# Patient Record
Sex: Female | Born: 1960
Health system: Southern US, Community
[De-identification: ages and names within clinical notes are randomized; demographics above are authoritative.]

## PROBLEM LIST (undated history)

## (undated) DIAGNOSIS — C4491 Basal cell carcinoma of skin, unspecified: Secondary | ICD-10-CM

## (undated) DIAGNOSIS — R7303 Prediabetes: Secondary | ICD-10-CM

## (undated) DIAGNOSIS — M8580 Other specified disorders of bone density and structure, unspecified site: Secondary | ICD-10-CM

## (undated) DIAGNOSIS — H269 Unspecified cataract: Secondary | ICD-10-CM

## (undated) DIAGNOSIS — K219 Gastro-esophageal reflux disease without esophagitis: Secondary | ICD-10-CM

## (undated) DIAGNOSIS — E785 Hyperlipidemia, unspecified: Secondary | ICD-10-CM

## (undated) DIAGNOSIS — E559 Vitamin D deficiency, unspecified: Secondary | ICD-10-CM

## (undated) DIAGNOSIS — I10 Essential (primary) hypertension: Secondary | ICD-10-CM

## (undated) DIAGNOSIS — IMO0002 Reserved for concepts with insufficient information to code with codable children: Secondary | ICD-10-CM

## (undated) HISTORY — DX: Vitamin D deficiency, unspecified: E55.9

## (undated) HISTORY — PX: TUBAL LIGATION: SHX77

## (undated) HISTORY — DX: Essential (primary) hypertension: I10

## (undated) HISTORY — DX: Hyperlipidemia, unspecified: E78.5

## (undated) HISTORY — DX: Gastro-esophageal reflux disease without esophagitis: K21.9

## (undated) HISTORY — DX: Reserved for concepts with insufficient information to code with codable children: IMO0002

## (undated) HISTORY — DX: Basal cell carcinoma of skin, unspecified: C44.91

## (undated) HISTORY — DX: Prediabetes: R73.03

## (undated) HISTORY — DX: Unspecified cataract: H26.9

## (undated) HISTORY — DX: Other specified disorders of bone density and structure, unspecified site: M85.80

## (undated) HISTORY — PX: BASAL CELL CARCINOMA EXCISION: SHX1214

---

## 2000-12-06 HISTORY — PX: COLONOSCOPY: SHX174

## 2002-04-10 ENCOUNTER — Other Ambulatory Visit: Admission: RE | Admit: 2002-04-10 | Discharge: 2002-04-10 | Payer: Self-pay | Admitting: Family Medicine

## 2003-09-02 ENCOUNTER — Other Ambulatory Visit: Admission: RE | Admit: 2003-09-02 | Discharge: 2003-09-02 | Payer: Self-pay | Admitting: Family Medicine

## 2005-03-09 ENCOUNTER — Other Ambulatory Visit: Admission: RE | Admit: 2005-03-09 | Discharge: 2005-03-09 | Payer: Self-pay | Admitting: Family Medicine

## 2006-08-05 ENCOUNTER — Ambulatory Visit (HOSPITAL_COMMUNITY): Admission: RE | Admit: 2006-08-05 | Discharge: 2006-08-05 | Payer: Self-pay | Admitting: Neurosurgery

## 2006-08-06 HISTORY — PX: ANTERIOR CERVICAL DECOMP/DISCECTOMY FUSION: SHX1161

## 2006-08-25 ENCOUNTER — Inpatient Hospital Stay (HOSPITAL_COMMUNITY): Admission: RE | Admit: 2006-08-25 | Discharge: 2006-08-26 | Payer: Self-pay | Admitting: Neurosurgery

## 2011-10-05 ENCOUNTER — Inpatient Hospital Stay (INDEPENDENT_AMBULATORY_CARE_PROVIDER_SITE_OTHER)
Admission: RE | Admit: 2011-10-05 | Discharge: 2011-10-05 | Disposition: A | Discharge status: Home / Self Care | Payer: 59 | Source: Ambulatory Visit | Attending: Family Medicine | Admitting: Family Medicine

## 2011-10-05 DIAGNOSIS — R6889 Other general symptoms and signs: Secondary | ICD-10-CM

## 2011-10-05 LAB — POCT I-STAT, CHEM 8
BUN: 18 mg/dL (ref 6–23)
Calcium, Ion: 0.99 mmol/L — ABNORMAL LOW (ref 1.12–1.32)
Creatinine, Ser: 0.9 mg/dL (ref 0.50–1.10)
Glucose, Bld: 105 mg/dL — ABNORMAL HIGH (ref 70–99)
Hemoglobin: 13.3 g/dL (ref 12.0–15.0)
Sodium: 138 mEq/L (ref 135–145)
TCO2: 23 mmol/L (ref 0–100)

## 2013-04-09 ENCOUNTER — Telehealth: Payer: Self-pay | Admitting: Nurse Practitioner

## 2013-04-09 NOTE — Telephone Encounter (Signed)
Returned call. Nurse not available i advised she would call back

## 2013-04-09 NOTE — Telephone Encounter (Signed)
Pm appt made for 5/6

## 2013-04-10 ENCOUNTER — Ambulatory Visit: Payer: Self-pay

## 2013-10-15 ENCOUNTER — Encounter: Payer: Self-pay | Admitting: Nurse Practitioner

## 2013-11-07 ENCOUNTER — Encounter: Payer: Self-pay | Admitting: Certified Nurse Midwife

## 2013-11-07 ENCOUNTER — Other Ambulatory Visit: Payer: Self-pay

## 2013-11-07 ENCOUNTER — Ambulatory Visit (INDEPENDENT_AMBULATORY_CARE_PROVIDER_SITE_OTHER): Payer: 59 | Admitting: Certified Nurse Midwife

## 2013-11-07 VITALS — BP 108/64 | HR 68 | Resp 16 | Ht 63.0 in | Wt 171.0 lb

## 2013-11-07 DIAGNOSIS — R319 Hematuria, unspecified: Secondary | ICD-10-CM

## 2013-11-07 DIAGNOSIS — Z1211 Encounter for screening for malignant neoplasm of colon: Secondary | ICD-10-CM

## 2013-11-07 DIAGNOSIS — Z0000 Encounter for general adult medical examination without abnormal findings: Secondary | ICD-10-CM

## 2013-11-07 DIAGNOSIS — Z1231 Encounter for screening mammogram for malignant neoplasm of breast: Secondary | ICD-10-CM

## 2013-11-07 DIAGNOSIS — Z01419 Encounter for gynecological examination (general) (routine) without abnormal findings: Secondary | ICD-10-CM

## 2013-11-07 LAB — POCT URINALYSIS DIPSTICK
Bilirubin, UA: NEGATIVE
Ketones, UA: NEGATIVE
Protein, UA: NEGATIVE
pH, UA: 5

## 2013-11-07 NOTE — Progress Notes (Signed)
52 y.o. Tanya Wilkinson Married Caucasian Fe here for annual exam. Menopausal no HRT. Denies vaginal bleeding, and some vaginal dryness. Sees PCP yearly for labs and aex, last visit was 2012.  Denies vaginal or urinary symptoms today.  Has taken flu shot through [facility]. Complaining of right buttock pain while sitting. Has no difficulty walking or standing, only after prolonged sitting. No injuries to area. No other health issues.  Patient's last menstrual period was 12/07/2007.          Sexually active: yes  The current method of family planning is tubal ligation.    Exercising: no  exercise Smoker:  no  Health Maintenance: Pap:  2011 or 2012 no abnormal paps MMG:  2002 normal per patient Colonoscopy:  2002  Normal per patient BMD:   none TDaP:  2007 Labs: Poct urine-rbc-tr, wbc-tr Self breast exam: done occ   reports that she has quit smoking. She quit smokeless tobacco use about 18 years ago. She reports that she does not drink alcohol or use illicit drugs.  Past Medical History  Diagnosis Date  . Herniated disc     Past Surgical History  Procedure Laterality Date  . Tubal ligation  [id number]    BTL  . Cesarean section    . Anterior cervical decomp/discectomy fusion  08/2006    No current outpatient prescriptions on file.   No current facility-administered medications for this visit.    Family History  Problem Relation Age of Onset  . Diabetes Mother   . Hypertension Mother   . Cancer Father     throat & lung-smoker  . Stroke Father     ROS:  Pertinent items are noted in HPI.  Otherwise, a comprehensive ROS was negative.  Exam:   BP 108/64  Pulse 68  Resp 16  Ht 5\' 3"  (1.6 m)  Wt 171 lb (77.565 kg)  BMI 30.30 kg/m2  LMP 12/07/2007 Height: 5\' 3"  (160 cm)  Ht Readings from Last 3 Encounters:  11/07/13 5\' 3"  (1.6 m)    General appearance: alert, cooperative and appears stated age Head: Normocephalic, without obvious abnormality, atraumatic Neck: no  adenopathy, supple, symmetrical, trachea midline and thyroid normal to inspection and palpation and non-palpable Lungs: clear to auscultation bilaterally CVAT negative bilateral Breasts: normal appearance, no masses or tenderness, No nipple retraction or dimpling, No nipple discharge or bleeding, No axillary or supraclavicular adenopathy Heart: regular rate and rhythm Abdomen: soft, non-tender; no masses,  no organomegaly Extremities: extremities normal, atraumatic, no cyanosis or edema Skin: Skin color, texture, turgor normal. No rashes or lesions Lymph nodes: Cervical, supraclavicular, and axillary nodes normal. No abnormal inguinal nodes palpated Neurologic: Grossly normal   Pelvic: External genitalia:  no lesions              Urethra:  normal appearing urethra with no masses, tenderness or lesions, Bladder non tender or urethral meatus              Bartholin's and Skene's: normal                 Vagina: normal appearing vagina with normal color and discharge, no lesions, slight dryness no              Cervix: normal, non tender              Pap taken: yes Bimanual Exam:  Uterus:  normal size, contour, position, consistency, mobility, non-tender and anteverted  Adnexa: normal adnexa and no mass, fullness, tenderness               Rectovaginal: Confirms               Anus:  normal sphincter tone, no lesions  A:  Well Woman with normal exam  Menopausal no HRT  Vaginal Dryness  R/O UTI with urine WBC Tr, RBC tr  ? Sciatic nerve pain from prolonged sitting right buttock  P:   Reviewed health and wellness pertinent to exam  Discussed importance of advising if vaginal bleeding.  Discussed findings and options for treatment of estrogen or OTC products. Patient prefers OTC product. Will advise if problems.  Increase water intake daily, aware of UTI symptoms  Discussed ambulating more frequently and changing chair cushion if possible, encouraged stretching area. If not  resolving needs to see Orthopedic. Patient agreeable.  Fasting labs: Patient will come in for.  Lipid panel, CMP, Hgb A1c, TSH, Vitamin D  Pap smear as per guidelines   Mammogram yearly given information to schedule pap smear taken today with [facility]  counseled on breast self exam, mammography screening, adequate intake of calcium and vitamin D, diet and exercise, Kegel's exercises, and colonoscopy due. Patient request scheduling for.  return annually or prn  An After Visit Summary was printed and given to the patient.

## 2013-11-07 NOTE — Patient Instructions (Signed)

## 2013-11-08 LAB — URINALYSIS, MICROSCOPIC ONLY
Bacteria, UA: NONE SEEN
Casts: NONE SEEN
Crystals: NONE SEEN
Squamous Epithelial / LPF: NONE SEEN

## 2013-11-09 ENCOUNTER — Other Ambulatory Visit (INDEPENDENT_AMBULATORY_CARE_PROVIDER_SITE_OTHER): Payer: 59

## 2013-11-09 DIAGNOSIS — Z0000 Encounter for general adult medical examination without abnormal findings: Secondary | ICD-10-CM

## 2013-11-09 LAB — CBC
Hemoglobin: 12 g/dL (ref 12.0–15.0)
MCH: 29.9 pg (ref 26.0–34.0)
MCHC: 34.1 g/dL (ref 30.0–36.0)
RDW: 13.1 % (ref 11.5–15.5)

## 2013-11-09 LAB — COMPREHENSIVE METABOLIC PANEL
AST: 19 U/L (ref 0–37)
BUN: 12 mg/dL (ref 6–23)
Calcium: 9.3 mg/dL (ref 8.4–10.5)
Chloride: 101 mEq/L (ref 96–112)
Creat: 0.53 mg/dL (ref 0.50–1.10)
Total Bilirubin: 0.4 mg/dL (ref 0.3–1.2)

## 2013-11-09 LAB — LIPID PANEL
Cholesterol: 243 mg/dL — ABNORMAL HIGH (ref 0–200)
HDL: 37 mg/dL — ABNORMAL LOW (ref >39)
Total CHOL/HDL Ratio: 6.6 Ratio
VLDL: 50 mg/dL — ABNORMAL HIGH (ref 0–40)

## 2013-11-09 LAB — HEMOGLOBIN A1C: Hgb A1c MFr Bld: 5.8 % — ABNORMAL HIGH (ref <5.7)

## 2013-11-09 LAB — TSH: TSH: 2.336 u[IU]/mL (ref 0.350–4.500)

## 2013-11-09 NOTE — Progress Notes (Signed)
Note reviewed, agree with plan.  Quatavious Rossa, MD  

## 2013-11-12 ENCOUNTER — Telehealth: Payer: Self-pay | Admitting: Emergency Medicine

## 2013-11-12 MED ORDER — VITAMIN D (ERGOCALCIFEROL) 1.25 MG (50000 UNIT) PO CAPS: 50000.0000 [IU] | ORAL_CAPSULE | ORAL | Status: DC

## 2013-11-12 NOTE — Telephone Encounter (Signed)
Message left to return call to Tanya Wilkinson at [phone].    

## 2013-11-12 NOTE — Telephone Encounter (Signed)
Message copied by Joeseph Amor on Mon Nov 12, 2013 11:02 AM ------      Message from: Verner Chol      Created: Sun Nov 11, 2013  7:31 PM       Notify patient that CBC essentially normal      Hgb. A1c is elevated which put her at increased of developing diabetes (this is reflection of glucose levels over the past 4-6 weeks)      Liver, kidney, and glucose profile normal      Lipid panel is in the high range and needs PCP management she needs to call and schedule appointment and will need labs sent for review(n if no PCP we can refer)      Vitamin D per protocol due to very low       ------

## 2013-11-12 NOTE — Telephone Encounter (Signed)
Patient returned call. Message from Verner Chol CNM given. Verbalized understanding. Vitamin D ordered and instructions given. Patient has a pcp. Bennie Pierini, FNP and will follow up with her.   Mychart results released at patients request.

## 2013-11-13 ENCOUNTER — Telehealth: Payer: Self-pay | Admitting: Orthopedic Surgery

## 2013-11-13 NOTE — Telephone Encounter (Signed)
LM on pt's VM confirming name re: appt with Dr. Loreta Ave 12-13-13 at 1:30 for colonoscopy consult. Phone number given to reschedule [phone].

## 2013-12-07 ENCOUNTER — Ambulatory Visit
Admission: RE | Admit: 2013-12-07 | Discharge: 2013-12-07 | Disposition: A | Discharge status: Home / Self Care | Payer: 59 | Source: Ambulatory Visit

## 2013-12-07 DIAGNOSIS — Z1231 Encounter for screening mammogram for malignant neoplasm of breast: Secondary | ICD-10-CM

## 2013-12-26 ENCOUNTER — Encounter: Payer: Self-pay | Admitting: Nurse Practitioner

## 2013-12-26 ENCOUNTER — Ambulatory Visit (INDEPENDENT_AMBULATORY_CARE_PROVIDER_SITE_OTHER): Payer: 59 | Admitting: Nurse Practitioner

## 2013-12-26 VITALS — BP 133/78 | HR 72 | Temp 97.8°F | Ht 63.0 in | Wt 173.0 lb

## 2013-12-26 DIAGNOSIS — E785 Hyperlipidemia, unspecified: Secondary | ICD-10-CM

## 2013-12-26 DIAGNOSIS — E8881 Metabolic syndrome: Secondary | ICD-10-CM

## 2013-12-26 MED ORDER — ATORVASTATIN CALCIUM 40 MG PO TABS: 40.0000 mg | ORAL_TABLET | Freq: Every day | ORAL | Status: DC

## 2013-12-26 NOTE — Patient Instructions (Signed)
Basic Carbohydrate Counting Basic carbohydrate counting is a way to plan meals. It is done by counting the amount of carbohydrate in foods. Foods that have carbohydrates are starches (grains, beans, starchy vegetables) and sweets. Eating carbohydrates increases blood glucose (sugar) levels. People with diabetes use carbohydrate counting to help keep their blood glucose at a normal level.  COUNTING CARBOHYDRATES IN FOODS The first step in counting carbohydrates is to learn how many carbohydrate servings you should have in every meal. A dietitian can plan this for you. After learning the amount of carbohydrates to include in your meal plan, you can start to choose the carbohydrate-containing foods you want to eat.  There are 2 ways to identify the amount of carbohydrates in the foods you eat.  Read the Nutrition Facts panel on food labels. You need 2 pieces of information from the Nutrition Facts panel to count carbohydrates this way:  Serving size.  Total carbohydrate (in grams). Decide how many servings you will be eating. If it is 1 serving, you will be eating the amount of carbohydrate listed on the panel. If you will be eating 2 servings, you will be eating double the amount of carbohydrate listed on the panel.   Learn serving sizes. A serving size of most carbohydrate-containing foods is about 15 grams (g). Listed below are single serving sizes of common carbohydrate-containing foods:  1 slice bread.   cup unsweetened, dry cereal.   cup hot cereal.   cup rice.   cup mashed potatoes.   cup pasta.  1 cup fresh fruit.   cup canned fruit.  1 cup milk (whole, 2%, or skim).   cup starchy vegetables (peas, corn, or potatoes). Counting carbohydrates this way is similar to looking on the Nutrition Facts panel. Decide how many servings you will eat first. Multiply the number of servings you eat by 15 g. For example, if you have 2 cups of strawberries, you had 2 servings. That means  you had 30 g of carbohydrate (2 servings x 15 g = 30 g). CALCULATING CARBOHYDRATES IN A MEAL Sample dinner  3 oz chicken breast.   cup brown rice.   cup corn.  1 cup fat-free milk.  1 cup strawberries with sugar-free whipped topping. Carbohydrate calculation First, identify the foods that contain carbohydrate:  Rice.  Corn.  Milk.  Strawberries. Calculate the number of servings eaten:  2 servings rice.  1 serving corn.  1 serving milk.  1 serving strawberries. Multiply the number of servings by 15 g:  2 servings rice x 15 g = 30 g.  1 serving corn x 15 g = 15 g.  1 serving milk x 15 g = 15 g.  1 serving strawberries x 15 g = 15 g. Add the amounts to find the total carbohydrates eaten: 30 g + 15 g + 15 g + 15 g = 75 g carbohydrate eaten at dinner. Document Released: 11/22/2005 Document Revised: 02/14/2012 Document Reviewed: 10/08/2011 [facility] Patient Information 2014 [place], [place]. Fat and Cholesterol Control Diet Fat and cholesterol levels in your blood and organs are influenced by your diet. High levels of fat and cholesterol may lead to diseases of the heart, small and large blood vessels, gallbladder, liver, and pancreas. CONTROLLING FAT AND CHOLESTEROL WITH DIET Although exercise and lifestyle factors are important, your diet is key. That is because certain foods are known to raise cholesterol and others to lower it. The goal is to balance foods for their effect on cholesterol and more importantly,  to replace saturated and trans fat with other types of fat, such as monounsaturated fat, polyunsaturated fat, and omega-3 fatty acids. On average, a person should consume no more than 15 to 17 g of saturated fat daily. Saturated and trans fats are considered "bad" fats, and they will raise LDL cholesterol. Saturated fats are primarily found in animal products such as meats, butter, and cream. However, that does not mean you need to give up all your favorite foods.  Today, there are good tasting, low-fat, low-cholesterol substitutes for most of the things you like to eat. Choose low-fat or nonfat alternatives. Choose round or loin cuts of red meat. These types of cuts are lowest in fat and cholesterol. Chicken (without the skin), fish, veal, and ground [place] breast are great choices. Eliminate fatty meats, such as hot dogs and salami. Even shellfish have little or no saturated fat. Have a 3 oz (85 g) portion when you eat lean meat, poultry, or fish. Trans fats are also called "partially hydrogenated oils." They are oils that have been scientifically manipulated so that they are solid at room temperature resulting in a longer shelf life and improved taste and texture of foods in which they are added. Trans fats are found in stick margarine, some tub margarines, cookies, crackers, and baked goods.  When baking and cooking, oils are a great substitute for butter. The monounsaturated oils are especially beneficial since it is believed they lower LDL and raise HDL. The oils you should avoid entirely are saturated tropical oils, such as coconut and palm.  Remember to eat a lot from food groups that are naturally free of saturated and trans fat, including fish, fruit, vegetables, beans, grains (barley, rice, couscous, bulgur wheat), and pasta (without cream sauces).  IDENTIFYING FOODS THAT LOWER FAT AND CHOLESTEROL  Soluble fiber may lower your cholesterol. This type of fiber is found in fruits such as apples, vegetables such as broccoli, potatoes, and carrots, legumes such as beans, peas, and lentils, and grains such as barley. Foods fortified with plant sterols (phytosterol) may also lower cholesterol. You should eat at least 2 g per day of these foods for a cholesterol lowering effect.  Read package labels to identify low-saturated fats, trans fat free, and low-fat foods at the supermarket. Select cheeses that have only 2 to 3 g saturated fat per ounce. Use a heart-healthy  tub margarine that is free of trans fats or partially hydrogenated oil. When buying baked goods (cookies, crackers), avoid partially hydrogenated oils. Breads and muffins should be made from whole grains (whole-wheat or whole oat flour, instead of "flour" or "enriched flour"). Buy non-creamy canned soups with reduced salt and no added fats.  FOOD PREPARATION TECHNIQUES  Never deep-fry. If you must fry, either stir-fry, which uses very little fat, or use non-stick cooking sprays. When possible, broil, bake, or roast meats, and steam vegetables. Instead of putting butter or margarine on vegetables, use lemon and herbs, applesauce, and cinnamon (for squash and sweet potatoes). Use nonfat yogurt, salsa, and low-fat dressings for salads.  LOW-SATURATED FAT / LOW-FAT FOOD SUBSTITUTES Meats / Saturated Fat (g)  Avoid: Steak, marbled (3 oz/85 g) / 11 g  Choose: Steak, lean (3 oz/85 g) / 4 g  Avoid: Hamburger (3 oz/85 g) / 7 g  Choose: Hamburger, lean (3 oz/85 g) / 5 g  Avoid: Ham (3 oz/85 g) / 6 g  Choose: Ham, lean cut (3 oz/85 g) / 2.4 g  Avoid: Chicken, with skin, dark meat (3  oz/85 g) / 4 g  Choose: Chicken, skin removed, dark meat (3 oz/85 g) / 2 g  Avoid: Chicken, with skin, light meat (3 oz/85 g) / 2.5 g  Choose: Chicken, skin removed, light meat (3 oz/85 g) / 1 g Dairy / Saturated Fat (g)  Avoid: Whole milk (1 cup) / 5 g  Choose: Low-fat milk, 2% (1 cup) / 3 g  Choose: Low-fat milk, 1% (1 cup) / 1.5 g  Choose: Skim milk (1 cup) / 0.3 g  Avoid: Hard cheese (1 oz/28 g) / 6 g  Choose: Skim milk cheese (1 oz/28 g) / 2 to 3 g  Avoid: Cottage cheese, 4% fat (1 cup) / 6.5 g  Choose: Low-fat cottage cheese, 1% fat (1 cup) / 1.5 g  Avoid: Ice cream (1 cup) / 9 g  Choose: Sherbet (1 cup) / 2.5 g  Choose: Nonfat frozen yogurt (1 cup) / 0.3 g  Choose: Frozen fruit bar / trace  Avoid: Whipped cream (1 tbs) / 3.5 g  Choose: Nondairy whipped topping (1 tbs) / 1 g Condiments /  Saturated Fat (g)  Avoid: Mayonnaise (1 tbs) / 2 g  Choose: Low-fat mayonnaise (1 tbs) / 1 g  Avoid: Butter (1 tbs) / 7 g  Choose: Extra light margarine (1 tbs) / 1 g  Avoid: Coconut oil (1 tbs) / 11.8 g  Choose: Olive oil (1 tbs) / 1.8 g  Choose: Corn oil (1 tbs) / 1.7 g  Choose: Safflower oil (1 tbs) / 1.2 g  Choose: Sunflower oil (1 tbs) / 1.4 g  Choose: Soybean oil (1 tbs) / 2.4 g  Choose: Canola oil (1 tbs) / 1 g Document Released: 11/22/2005 Document Revised: 03/19/2013 Document Reviewed: 05/13/2011 [employer] Patient Information 2014 [place], [place].

## 2013-12-26 NOTE — Progress Notes (Signed)
   Subjective:    Patient ID: Tanya Wilkinson, female    DOB: [birth year], 53 y.o.   MRN: [id number]  HPI Patient had CPE and PAP in December at [facility]- They did blood work and found er cholesterol to be elevated and they wanted her to follow up with her PCP to get meds.    Review of Systems  Constitutional: Negative.   HENT: Negative.   Respiratory: Negative.   Cardiovascular: Negative.   Gastrointestinal: Negative.   Genitourinary: Negative.   All other systems reviewed and are negative.       Objective:   Physical Exam  Constitutional: She is oriented to person, place, and time. She appears well-developed and well-nourished.  Cardiovascular: Normal rate, regular rhythm and normal heart sounds.   Pulmonary/Chest: Effort normal and breath sounds normal.  Neurological: She is alert and oriented to person, place, and time.  Psychiatric: She has a normal mood and affect. Her behavior is normal. Judgment and thought content normal.    BP 133/78  Pulse 72  Temp(Src) 97.8 F (36.6 C) (Oral)  Ht 5\' 3"  (1.6 m)  Wt 173 lb (78.472 kg)  BMI 30.65 kg/m2       Assessment & Plan:   1. Hyperlipidemia LDL goal < 100   2. Metabolic syndrome    No orders of the defined types were placed in this encounter.   Meds ordered this encounter  Medications  . atorvastatin (LIPITOR) 40 MG tablet    Sig: Take 1 tablet (40 mg total) by mouth daily.    Dispense:  90 tablet    Refill:  1    Order Specific Question:  Supervising Provider    Answer:  Chipper Herb [[id number]]   Discussed carbs no more than 50g per meal and 15g x2 snacks Labs pending Health maintenance reviewed Diet and exercise encouraged Continue all meds Follow up  In 3 months   [place], FNP

## 2014-04-01 ENCOUNTER — Ambulatory Visit: Payer: 59 | Admitting: Nurse Practitioner

## 2014-04-04 ENCOUNTER — Encounter: Payer: Self-pay | Admitting: *Deleted

## 2014-05-02 ENCOUNTER — Ambulatory Visit: Payer: 59 | Admitting: Nurse Practitioner

## 2014-05-03 ENCOUNTER — Ambulatory Visit: Payer: 59 | Admitting: Nurse Practitioner

## 2014-06-05 ENCOUNTER — Ambulatory Visit (INDEPENDENT_AMBULATORY_CARE_PROVIDER_SITE_OTHER): Payer: 59 | Admitting: Nurse Practitioner

## 2014-06-05 ENCOUNTER — Encounter: Payer: Self-pay | Admitting: Nurse Practitioner

## 2014-06-05 VITALS — BP 118/78 | HR 65 | Temp 98.8°F | Ht 63.0 in | Wt 172.4 lb

## 2014-06-05 DIAGNOSIS — Z13820 Encounter for screening for osteoporosis: Secondary | ICD-10-CM

## 2014-06-05 DIAGNOSIS — E785 Hyperlipidemia, unspecified: Secondary | ICD-10-CM

## 2014-06-05 MED ORDER — ATORVASTATIN CALCIUM 40 MG PO TABS: 40.0000 mg | ORAL_TABLET | Freq: Every day | ORAL | Status: DC

## 2014-06-05 NOTE — Patient Instructions (Signed)

## 2014-06-05 NOTE — Progress Notes (Signed)
   Subjective:    Patient ID: Tanya Wilkinson, female    DOB: January 13, 1961, 53 y.o.   MRN: [id number] Patient is here today for chronic disease follow up. No complaints.   Hyperlipidemia This is a chronic problem. The current episode started more than 1 year ago. The problem is controlled. She has no history of chronic renal disease, diabetes, hypothyroidism or liver disease. Pertinent negatives include no chest pain, focal sensory loss, focal weakness, leg pain, myalgias or shortness of breath. Current antihyperlipidemic treatment includes statins. The current treatment provides moderate improvement of lipids. There are no compliance problems.       Review of Systems  Constitutional: Negative.   HENT: Negative.   Eyes: Negative.   Respiratory: Negative.  Negative for shortness of breath.   Cardiovascular: Negative.  Negative for chest pain.  Gastrointestinal: Negative.   Endocrine: Negative.   Genitourinary: Negative.   Musculoskeletal: Negative.  Negative for myalgias.  Skin: Negative.   Allergic/Immunologic: Negative.   Neurological: Negative.  Negative for focal weakness.  Hematological: Negative.   Psychiatric/Behavioral: Negative.        Objective:   Physical Exam  Constitutional: She is oriented to person, place, and time. She appears well-developed and well-nourished.  HENT:  Head: Normocephalic.  Eyes: Pupils are equal, round, and reactive to light.  Neck: Normal range of motion.  Cardiovascular: Normal rate.   Abdominal: Soft.  Musculoskeletal: Normal range of motion.  Neurological: She is alert and oriented to person, place, and time.     BP 118/78  Pulse 65  Temp(Src) 98.8 F (37.1 C) (Oral)  Ht _0  (1.6 m)  Wt 172 lb 6.4 oz (78.2 kg)  BMI 30.55 kg/m2      Assessment & Plan:   1. Hyperlipidemia with target LDL less than 100    Orders Placed This Encounter  Procedures  . DG Bone Density    Standing Status: Future     Number of Occurrences:    Standing Expiration Date: 08/05/2015    Order Specific Question:  Reason for Exam (SYMPTOM  OR DIAGNOSIS REQUIRED)    Answer:  screening    Order Specific Question:  Is the patient pregnant?    Answer:  No    Order Specific Question:  Preferred imaging location?    Answer:  Internal  . CMP14+EGFR  . NMR, lipoprofile   Meds ordered this encounter  Medications  . atorvastatin (LIPITOR) 40 MG tablet    Sig: Take 1 tablet (40 mg total) by mouth daily.    Dispense:  90 tablet    Refill:  1    Order Specific Question:  Supervising Provider    Answer:  Chipper Herb [[id number]]    Keep appointment for colonoscopy that is already scheduled Labs pending Health maintenance reviewed Diet and exercise encouraged Continue all meds Follow up  In 3  Months  [place], [place]ell Done, FNP

## 2014-06-06 LAB — CMP14+EGFR
ALBUMIN: 4.5 g/dL (ref 3.5–5.5)
ALT: 14 IU/L (ref 0–32)
AST: 18 IU/L (ref 0–40)
Albumin/Globulin Ratio: 2 (ref 1.1–2.5)
Alkaline Phosphatase: 112 IU/L (ref 39–117)
BILIRUBIN TOTAL: 0.5 mg/dL (ref 0.0–1.2)
BUN/Creatinine Ratio: 16 (ref 9–23)
BUN: 11 mg/dL (ref 6–24)
CALCIUM: 9.7 mg/dL (ref 8.7–10.2)
CHLORIDE: 101 mmol/L (ref 97–108)
CO2: 26 mmol/L (ref 18–29)
Creatinine, Ser: 0.69 mg/dL (ref 0.57–1.00)
GFR calc non Af Amer: 99 mL/min/{1.73_m2} (ref >59)
GFR, EST AFRICAN AMERICAN: 114 mL/min/{1.73_m2} (ref >59)
GLUCOSE: 93 mg/dL (ref 65–99)
Globulin, Total: 2.2 g/dL (ref 1.5–4.5)
POTASSIUM: 4.9 mmol/L (ref 3.5–5.2)
Sodium: 142 mmol/L (ref 134–144)
TOTAL PROTEIN: 6.7 g/dL (ref 6.0–8.5)

## 2014-06-06 LAB — NMR, LIPOPROFILE
CHOLESTEROL: 176 mg/dL (ref 100–199)
HDL CHOLESTEROL BY NMR: 22 mg/dL — AB (ref >39)
HDL Particle Number: 24.8 umol/L — ABNORMAL LOW (ref >=30.5)
LDL PARTICLE NUMBER: 899 nmol/L (ref <1000)
LDL Size: 19.6 nm (ref >20.5)
LP-IR Score: 69 — ABNORMAL HIGH (ref <=45)
Small LDL Particle Number: 563 nmol/L — ABNORMAL HIGH (ref <=527)
TRIGLYCERIDES BY NMR: 414 mg/dL — AB (ref 0–149)

## 2014-06-24 ENCOUNTER — Encounter: Payer: Self-pay | Admitting: Internal Medicine

## 2014-07-22 ENCOUNTER — Encounter: Payer: Self-pay | Admitting: Internal Medicine

## 2014-07-24 ENCOUNTER — Encounter: Payer: 59 | Admitting: Internal Medicine

## 2014-09-04 ENCOUNTER — Encounter: Payer: Self-pay | Admitting: Pharmacist

## 2014-09-04 ENCOUNTER — Ambulatory Visit (INDEPENDENT_AMBULATORY_CARE_PROVIDER_SITE_OTHER): Payer: 59 | Admitting: Pharmacist

## 2014-09-04 ENCOUNTER — Ambulatory Visit (AMBULATORY_SURGERY_CENTER): Payer: Self-pay | Admitting: *Deleted

## 2014-09-04 ENCOUNTER — Ambulatory Visit (INDEPENDENT_AMBULATORY_CARE_PROVIDER_SITE_OTHER): Payer: 59

## 2014-09-04 VITALS — Ht 64.0 in | Wt 176.0 lb

## 2014-09-04 VITALS — Ht 64.0 in | Wt 174.0 lb

## 2014-09-04 DIAGNOSIS — M949 Disorder of cartilage, unspecified: Secondary | ICD-10-CM

## 2014-09-04 DIAGNOSIS — Z1211 Encounter for screening for malignant neoplasm of colon: Secondary | ICD-10-CM

## 2014-09-04 DIAGNOSIS — Z13820 Encounter for screening for osteoporosis: Secondary | ICD-10-CM

## 2014-09-04 DIAGNOSIS — M8580 Other specified disorders of bone density and structure, unspecified site: Secondary | ICD-10-CM | POA: Insufficient documentation

## 2014-09-04 DIAGNOSIS — M899 Disorder of bone, unspecified: Secondary | ICD-10-CM

## 2014-09-04 DIAGNOSIS — Z23 Encounter for immunization: Secondary | ICD-10-CM

## 2014-09-04 DIAGNOSIS — R7989 Other specified abnormal findings of blood chemistry: Secondary | ICD-10-CM

## 2014-09-04 LAB — HM DEXA SCAN

## 2014-09-04 MED ORDER — CALCIUM + D3 600-200 MG-UNIT PO TABS: 1.0000 | ORAL_TABLET | Freq: Every day | ORAL | Status: DC

## 2014-09-04 MED ORDER — MOVIPREP 100 G PO SOLR: ORAL | Status: DC

## 2014-09-04 NOTE — Progress Notes (Signed)
Patient ID: SAMI ROES, female   DOB: 03/28/1961, 53 y.o.   MRN: [id number]  Osteoporosis [facility] Current Height: Height: 5\' 4"  (162.6 cm)      Max Lifetime Height:  5\' 4"  Current Weight: Weight: 174 lb (78.926 kg)       Ethnicity:Caucasian    HPI: Does pt already have a diagnosis of:  Osteopenia?  No Osteoporosis?  No  Back Pain?  Yes       Kyphosis?  No Prior fracture?  No Med(s) for Osteoporosis/Osteopenia:  none Med(s) previously tried for Osteoporosis/Osteopenia:  none                                                             PMH: Age at menopause:  53yo Hysterectomy?  No Oophorectomy?  No HRT? No Steroid Use?  No Thyroid med?  No History of cancer?  No History of digestive disorders (ie Crohn's)?  No Current or previous eating disorders?  No Last Vitamin D Result:  14 (11/09/2013) Last GFR Result:  99 (06/05/2014)   FH/SH: Family history of osteoporosis?  No Parent with history of hip fracture?  No Family history of breast cancer?  No Exercise?  No Smoking?  No Alcohol?  No    Calcium Assessment Calcium Intake  # of servings/day  Calcium mg  Milk (8 oz) 0.5  x  300  = 150mg   Yogurt (4 oz) 0.5 x  200 = 100mg   Cheese (1 oz) 0.5 x  200 = 100mg   Other Calcium sources   250mg   Ca supplement 0 = 0   Estimated calcium intake per day 600mg     DEXA Results Date of Test T-Score for AP Spine L1-L4 T-Score for Total Left Hip T-Score for Total Right Hip  09/04/2014 -1.4 -1.9 -1.7                  FRAX 10 year estimate: Total FX risk:  7.3%  (consider medication if >/= 20%) Hip FX risk:  0.8%  (consider medication if >/= 3%)  Assessment: Osteopenia with low fracture risk per FRAX estimate  Recommendations: 1.  Discussed DEXA results and fracture risk. 2.  recommend calcium 1200mg  daily through supplementation or diet.  3.  recommend weight bearing exercise - 30 minutes at least 4 days per week.   4.  Counseled and educated about fall risk and  prevention.  Recheck DEXA:  2 years  Time spent counseling patient:  30 minutes  Cherre Robins, PharmD, CPP

## 2014-09-04 NOTE — Progress Notes (Signed)
Patient denies any allergies to eggs or soy. Patient denies any problems with anesthesia/sedation. Patient denies any oxygen use at home and does not take any diet/weight loss medications. EMMI education assisgned to patient on colonoscopy, this was explained and instructions given to patient. 

## 2014-09-04 NOTE — Patient Instructions (Signed)
Fall Prevention and Home Safety Falls cause injuries and can affect all age groups. It is possible to use preventive measures to significantly decrease the likelihood of falls. There are many simple measures which can make your home safer and prevent falls. OUTDOORS  Repair cracks and edges of walkways and driveways.  Remove high doorway thresholds.  Trim shrubbery on the main path into your home.  Have good outside lighting.  Clear walkways of tools, rocks, debris, and clutter.  Check that handrails are not broken and are securely fastened. Both sides of steps should have handrails.  Have leaves, snow, and ice cleared regularly.  Use sand or salt on walkways during winter months.  In the garage, clean up grease or oil spills. BATHROOM  Install night lights.  Install grab bars by the toilet and in the tub and shower.  Use non-skid mats or decals in the tub or shower.  Place a plastic non-slip stool in the shower to sit on, if needed.  Keep floors dry and clean up all water on the floor immediately.  Remove soap buildup in the tub or shower on a regular basis.  Secure bath mats with non-slip, double-sided rug tape.  Remove throw rugs and tripping hazards from the floors. BEDROOMS  Install night lights.  Make sure a bedside light is easy to reach.  Do not use oversized bedding.  Keep a telephone by your bedside.  Have a firm chair with side arms to use for getting dressed.  Remove throw rugs and tripping hazards from the floor. KITCHEN  Keep handles on pots and pans turned toward the center of the stove. Use back burners when possible.  Clean up spills quickly and allow time for drying.  Avoid walking on wet floors.  Avoid hot utensils and knives.  Position shelves so they are not too high or low.  Place commonly used objects within easy reach.  If necessary, use a sturdy step stool with a grab bar when reaching.  Keep electrical cables out of the  way.  Do not use floor polish or wax that makes floors slippery. If you must use wax, use non-skid floor wax.  Remove throw rugs and tripping hazards from the floor. STAIRWAYS  Never leave objects on stairs.  Place handrails on both sides of stairways and use them. Fix any loose handrails. Make sure handrails on both sides of the stairways are as long as the stairs.  Check carpeting to make sure it is firmly attached along stairs. Make repairs to worn or loose carpet promptly.  Avoid placing throw rugs at the top or bottom of stairways, or properly secure the rug with carpet tape to prevent slippage. Get rid of throw rugs, if possible.  Have an electrician put in a light switch at the top and bottom of the stairs. OTHER FALL PREVENTION TIPS  Wear low-heel or rubber-soled shoes that are supportive and fit well. Wear closed toe shoes.  When using a stepladder, make sure it is fully opened and both spreaders are firmly locked. Do not climb a closed stepladder.  Add color or contrast paint or tape to grab bars and handrails in your home. Place contrasting color strips on first and last steps.  Learn and use mobility aids as needed. Install an electrical emergency response system.  Turn on lights to avoid dark areas. Replace light bulbs that burn out immediately. Get light switches that glow.  Arrange furniture to create clear pathways. Keep furniture in the same place.    Firmly attach carpet with non-skid or double-sided tape.  Eliminate uneven floor surfaces.  Select a carpet pattern that does not visually hide the edge of steps.  Be aware of all pets. OTHER HOME SAFETY TIPS  Set the water temperature for 120 F (48.8 C).  Keep emergency numbers on or near the telephone.  Keep smoke detectors on every level of the home and near sleeping areas. Document Released: 11/12/2002 Document Revised: 05/23/2012 Document Reviewed: 02/11/2012 Exit[employer] Patient Information 2015  [employer]. This information is not intended to replace advice given to you by your health care provider. Make sure you discuss any questions you have with your health care provider.                Exercise for Strong Bones  Exercise is important to build and maintain strong bones / bone density.  There are 2 types of exercises that are important to building and maintaining strong bones:  Weight- bearing and muscle-stregthening.  Weight-bearing Exercises  These exercises include activities that make you move against gravity while staying upright. Weight-bearing exercises can be high-impact or low-impact.  High-impact weight-bearing exercises help build bones and keep them strong. If you have broken a bone due to osteoporosis or are at risk of breaking a bone, you may need to avoid high-impact exercises. If you're not sure, you should check with your healthcare provider.  Examples of high-impact weight-bearing exercises are: Dancing  Doing high-impact aerobics  Hiking  Jogging/running  Jumping Rope  Stair climbing  Tennis  Low-impact weight-bearing exercises can also help keep bones strong and are a safe alternative if you cannot do high-impact exercises.   Examples of low-impact weight-bearing exercises are: Using elliptical training machines  Doing low-impact aerobics  Using stair-step machines  Fast walking on a treadmill or outside   Muscle-Strengthening Exercises These exercises include activities where you move your body, a weight or some other resistance against gravity. They are also known as resistance exercises and include: Lifting weights  Using elastic exercise bands  Using weight machines  Lifting your own body weight  Functional movements, such as standing and rising up on your toes  Yoga and Pilates can also improve strength, balance and flexibility. However, certain positions may not be safe for people with osteoporosis or those at increased risk of broken  bones. For example, exercises that have you bend forward may increase the chance of breaking a bone in the spine.   Non-Impact Exercises There are other types of exercises that can help prevent falls.  Non-impact exercises can help you to improve balance, posture and how well you move in everyday activities. Some of these exercises include: Balance exercises that strengthen your legs and test your balance, such as Tai Chi, can decrease your risk of falls.  Posture exercises that improve your posture and reduce rounded or "sloping" shoulders can help you decrease the chance of breaking a bone, especially in the spine.  Functional exercises that improve how well you move can help you with everyday activities and decrease your chance of falling and breaking a bone. For example, if you have trouble getting up from a chair or climbing stairs, you should do these activities as exercises.   **A physical therapist can teach you balance, posture and functional exercises. He/she can also help you learn which exercises are safe and appropriate for you.  [facility] has a physical therapy office in [place] in front of our office and referrals can be made for assessments   and treatment as needed and strength and balance training.  If you would like to have an assessment with [facility] and our physical therapy team please let a nurse or provider know.    

## 2014-09-05 ENCOUNTER — Telehealth: Payer: Self-pay | Admitting: Pharmacist

## 2014-09-05 DIAGNOSIS — R7989 Other specified abnormal findings of blood chemistry: Secondary | ICD-10-CM

## 2014-09-05 LAB — VITAMIN D 25 HYDROXY (VIT D DEFICIENCY, FRACTURES): VIT D 25 HYDROXY: 15.2 ng/mL — AB (ref 30.0–100.0)

## 2014-09-05 MED ORDER — VITAMIN D (ERGOCALCIFEROL) 1.25 MG (50000 UNIT) PO CAPS: 50000.0000 [IU] | ORAL_CAPSULE | ORAL | Status: DC

## 2014-09-05 NOTE — Telephone Encounter (Signed)
Patient notified about low vitamin D level.  Rx send to pharmacy for Vitamin D 50,000IU weekly.  Patient also given appt to recheck viatmin D level in January 2016.

## 2014-09-18 ENCOUNTER — Encounter: Payer: Self-pay | Admitting: Internal Medicine

## 2014-09-18 ENCOUNTER — Ambulatory Visit (AMBULATORY_SURGERY_CENTER): Payer: 59 | Admitting: Internal Medicine

## 2014-09-18 VITALS — BP 102/64 | HR 69 | Temp 97.8°F | Resp 16 | Ht 64.0 in | Wt 176.0 lb

## 2014-09-18 DIAGNOSIS — Z1211 Encounter for screening for malignant neoplasm of colon: Secondary | ICD-10-CM

## 2014-09-18 DIAGNOSIS — D127 Benign neoplasm of rectosigmoid junction: Secondary | ICD-10-CM

## 2014-09-18 MED ORDER — SODIUM CHLORIDE 0.9 % IV SOLN: 500.0000 mL | INTRAVENOUS | Status: DC

## 2014-09-18 NOTE — Patient Instructions (Signed)
YOU HAD AN ENDOSCOPIC PROCEDURE TODAY AT THE [facility]: Refer to the procedure report that was given to you for any specific questions about what was found during the examination.  If the procedure report does not answer your questions, please call your gastroenterologist to clarify.  If you requested that your care partner not be given the details of your procedure findings, then the procedure report has been included in a sealed envelope for you to review at your convenience later.  YOU SHOULD EXPECT: Some feelings of bloating in the abdomen. Passage of more gas than usual.  Walking can help get rid of the air that was put into your GI tract during the procedure and reduce the bloating. If you had a lower endoscopy (such as a colonoscopy or flexible sigmoidoscopy) you may notice spotting of blood in your stool or on the toilet paper. If you underwent a bowel prep for your procedure, then you may not have a normal bowel movement for a few days.  DIET: Your first meal following the procedure should be a light meal and then it is ok to progress to your normal diet.  A half-sandwich or bowl of soup is an example of a good first meal.  Heavy or fried foods are harder to digest and may make you feel nauseous or bloated.  Likewise meals heavy in dairy and vegetables can cause extra gas to form and this can also increase the bloating.  Drink plenty of fluids but you should avoid alcoholic beverages for 24 hours.  ACTIVITY: Your care partner should take you home directly after the procedure.  You should plan to take it easy, moving slowly for the rest of the day.  You can resume normal activity the day after the procedure however you should NOT DRIVE or use heavy machinery for 24 hours (because of the sedation medicines used during the test).    SYMPTOMS TO REPORT IMMEDIATELY: A gastroenterologist can be reached at any hour.  During normal business hours, 8:30 AM to 5:00 PM Monday through Friday,  call [phone].  After hours and on weekends, please call the GI answering service at [phone] who will take a message and have the physician on call contact you.   Following lower endoscopy (colonoscopy or flexible sigmoidoscopy):  Excessive amounts of blood in the stool  Significant tenderness or worsening of abdominal pains  Swelling of the abdomen that is new, acute  Fever of 100F or higher   FOLLOW UP: If any biopsies were taken you will be contacted by phone or by letter within the next 1-3 weeks.  Call your gastroenterologist if you have not heard about the biopsies in 3 weeks.  Our staff will call the home number listed on your records the next business day following your procedure to check on you and address any questions or concerns that you may have at that time regarding the information given to you following your procedure. This is a courtesy call and so if there is no answer at the home number and we have not heard from you through the emergency physician on call, we will assume that you have returned to your regular daily activities without incident.  SIGNATURES/CONFIDENTIALITY: You and/or your care partner have signed paperwork which will be entered into your electronic medical record.  These signatures attest to the fact that that the information above on your After Visit Summary has been reviewed and is understood.  Full responsibility of the confidentiality of   this discharge information lies with you and/or your care-partner.  Polyp information given.  Dr. Hilarie Fredrickson will advise you about next colonoscopy after pathology reports are reviewed.

## 2014-09-18 NOTE — Progress Notes (Signed)
A/ox3, pleased with MAC, report to RN 

## 2014-09-18 NOTE — Progress Notes (Signed)
Called to room to assist during endoscopic procedure.  Patient ID and intended procedure confirmed with present staff. Received instructions for my participation in the procedure from the performing physician.  

## 2014-09-18 NOTE — Op Note (Signed)
[facility]  [address] [place], [id number]   COLONOSCOPY PROCEDURE REPORT  PATIENT: Tanya Wilkinson, Tanya Wilkinson  MR#: [id number] BIRTHDATE: [birth year] , 61  yrs. old GENDER: female ENDOSCOPIST: Jerene Bears, MD REFERRED NF:AOZH Rockne Coons, N.P. PROCEDURE DATE:  09/18/2014 PROCEDURE:   Colonoscopy with snare polypectomy First Screening Colonoscopy - Avg.  risk and is 50 yrs.  old or older Yes.  Prior Negative Screening - Now for repeat screening. N/A  History of Adenoma - Now for follow-up colonoscopy & has been > or = to 3 yrs.  N/A  Polyps Removed Today? Yes. ASA CLASS:   Class II INDICATIONS:average risk for colon cancer. MEDICATIONS: Monitored anesthesia care and Propofol 300 mg IV  DESCRIPTION OF PROCEDURE:   After the risks benefits and alternatives of the procedure were thoroughly explained, informed consent was obtained.  The digital rectal exam revealed no rectal mass.   The LB [id number]  endoscope was introduced through the anus and advanced to the terminal ileum which was intubated for a short distance. No adverse events experienced.   The quality of the prep was good, using MoviPrep  The instrument was then slowly withdrawn as the colon was fully examined.   COLON FINDINGS: The examined terminal ileum appeared to be normal. A sessile polyp measuring 5 mm in size was found in the sigmoid colon.  A polypectomy was performed with a cold snare.  The resection was complete, the polyp tissue was completely retrieved and sent to histology.  Retroflexed views revealed external hemorrhoids. The time to cecum=2 minutes 38 seconds.  Withdrawal time=9 minutes 07 seconds.  The scope was withdrawn and the procedure completed. COMPLICATIONS: There were no immediate complications.  ENDOSCOPIC IMPRESSION: 1.   The examined terminal ileum appeared to be normal 2.   Sessile polyp was found in the sigmoid colon; polypectomy was performed with a cold  snare  RECOMMENDATIONS: 1.  Await pathology results 2.  If the polyp removed today is proven to be an adenomatous (pre-cancerous) polyp, you will need a repeat colonoscopy in 5 years.  Otherwise you should continue to follow colorectal cancer screening guidelines for "routine risk" patients with colonoscopy in 10 years.  You will receive a letter within 1-2 weeks with the results of your biopsy as well as final recommendations.  Please call my office if you have not received a letter after 3 weeks.  eSigned:  Jerene Bears, MD 09/18/2014 9:00 AM   cc: The Patient and Chevis Pretty, NP

## 2014-09-19 ENCOUNTER — Telehealth: Payer: Self-pay | Admitting: *Deleted

## 2014-09-19 NOTE — Telephone Encounter (Signed)
  Follow up Call-  Call back number 09/18/2014  Post procedure Call Back phone  # [phone]  Permission to leave phone message Yes     Patient questions:  Do you have a fever, pain , or abdominal swelling? No. Pain Score  0 *  Have you tolerated food without any problems? Yes.    Have you been able to return to your normal activities? Yes.    Do you have any questions about your discharge instructions: Diet   No. Medications  No. Follow up visit  No.  Do you have questions or concerns about your Care? No.  Actions: * If pain score is 4 or above: No action needed, pain <4.

## 2014-09-23 ENCOUNTER — Encounter: Payer: Self-pay | Admitting: Internal Medicine

## 2014-10-04 ENCOUNTER — Ambulatory Visit: Payer: 59 | Admitting: Nurse Practitioner

## 2014-10-05 ENCOUNTER — Emergency Department (HOSPITAL_COMMUNITY)
Admission: EM | Admit: 2014-10-05 | Discharge: 2014-10-05 | Disposition: A | Discharge status: Home / Self Care | Payer: 59 | Source: Home / Self Care | Attending: Family Medicine | Admitting: Family Medicine

## 2014-10-05 ENCOUNTER — Encounter (HOSPITAL_COMMUNITY): Payer: Self-pay | Admitting: Emergency Medicine

## 2014-10-05 DIAGNOSIS — S39012A Strain of muscle, fascia and tendon of lower back, initial encounter: Secondary | ICD-10-CM

## 2014-10-05 MED ORDER — METAXALONE 800 MG PO TABS: 800.0000 mg | ORAL_TABLET | Freq: Three times a day (TID) | ORAL | Status: DC

## 2014-10-05 MED ORDER — IBUPROFEN 800 MG PO TABS
800.0000 mg | ORAL_TABLET | Freq: Three times a day (TID) | ORAL | Status: DC
Start: 2014-10-05 — End: 2015-10-31

## 2014-10-05 NOTE — ED Notes (Signed)
Pt     Reports     Low  Back  Pain   Worse  On  Movement  And  Certain  posistions         denys any  Urinary  Problems   Ambulated  To    Room  Wit  A  Steady  Fluid  Gait

## 2014-10-05 NOTE — ED Provider Notes (Signed)
CSN: [id number]     Arrival date & time 10/05/14  1457 History   First MD Initiated Contact with Patient 10/05/14 1518     Chief Complaint  Patient presents with  . Back Pain   (Consider location/radiation/quality/duration/timing/severity/associated sxs/prior Treatment) Patient is a 53 y.o. female presenting with back pain. The history is provided by the patient.  Back Pain Location:  Lumbar spine Quality:  Burning Radiates to:  Does not radiate Pain severity:  Mild Onset quality:  Sudden Duration:  2 days Progression:  Unchanged Chronicity:  New Context: lifting heavy objects and twisting   Context comment:  Onset while cleaning Relieved by:  None tried Worsened by:  Nothing tried Ineffective treatments:  None tried Associated symptoms: no abdominal pain, no chest pain, no fever, no leg pain, no numbness, no pelvic pain and no tingling     Past Medical History  Diagnosis Date  . Herniated disc   . Hyperlipidemia   . Vitamin D deficiency    Past Surgical History  Procedure Laterality Date  . Cesarean section  [id number]  . Anterior cervical decomp/discectomy fusion  08/2006  . Colonoscopy  [facility] in [place]   . Tubal ligation  [id number]    BTL   Family History  Problem Relation Age of Onset  . Diabetes Mother   . Hypertension Mother   . Cancer Father     throat & lung-smoker  . Stroke Father   . Stroke Brother   . Hypertension Brother   . Stroke Brother   . Early death Brother 1  . Colon cancer Neg Hx   . Pancreatic cancer Neg Hx   . Stomach cancer Neg Hx   . Esophageal cancer Neg Hx    History  Substance Use Topics  . Smoking status: Former Smoker    Quit date: 12/06/1994  . Smokeless tobacco: Never Used  . Alcohol Use: No   OB History   Grav Para Term Preterm Abortions TAB SAB Ect Mult Living   4 4 4       4      Review of Systems  Constitutional: Negative.  Negative for fever.  Cardiovascular: Negative for chest pain.   Gastrointestinal: Negative.  Negative for abdominal pain.  Genitourinary: Negative.  Negative for pelvic pain.  Musculoskeletal: Positive for back pain. Negative for gait problem.  Skin: Negative.   Neurological: Negative for tingling and numbness.    Allergies  Morphine and related  Home Medications   Prior to Admission medications   Medication Sig Start Date End Date Taking? Authorizing Provider  atorvastatin (LIPITOR) 40 MG tablet Take 1 tablet (40 mg total) by mouth daily. 06/05/14   Mary-Margaret Hassell Done, FNP  Calcium Carb-Cholecalciferol (CALCIUM + D3) 600-200 MG-UNIT TABS Take 1 tablet by mouth daily. 09/04/14   Tammy Eckard, PHARMD  ibuprofen (ADVIL,MOTRIN) 800 MG tablet Take 1 tablet (800 mg total) by mouth 3 (three) times daily. 10/05/14   Billy Fischer, MD  metaxalone (SKELAXIN) 800 MG tablet Take 1 tablet (800 mg total) by mouth 3 (three) times daily. As muscle relaxer 10/05/14   Billy Fischer, MD  Multiple Vitamins-Minerals (CENTRUM SILVER PO) Take 1 tablet by mouth daily.    Historical Provider, MD  Vitamin D, Ergocalciferol, (DRISDOL) 50000 UNITS CAPS capsule Take 1 capsule (50,000 Units total) by mouth every 7 (seven) days. 09/05/14   Tammy Eckard, PHARMD   BP 148/85  Pulse 64  Temp(Src) 98.9 F (37.2 C) (Oral)  Resp  16  SpO2 97%  LMP 12/07/2007 Physical Exam  Nursing note and vitals reviewed. Constitutional: She is oriented to person, place, and time. She appears well-developed and well-nourished.  Abdominal: Soft. Bowel sounds are normal. She exhibits no mass. There is no tenderness.  Musculoskeletal:       Lumbar back: She exhibits tenderness. She exhibits no bony tenderness, no swelling, no pain, no spasm and normal pulse.  Neurological: She is alert and oriented to person, place, and time.  Skin: Skin is warm and dry.    ED Course  Procedures (including critical care time) Labs Review Labs Reviewed - No data to display  Imaging Review No results  found.   MDM   1. Low back strain, initial encounter        Billy Fischer, MD 10/05/14 1536

## 2014-10-07 ENCOUNTER — Encounter (HOSPITAL_COMMUNITY): Payer: Self-pay | Admitting: Emergency Medicine

## 2014-11-18 ENCOUNTER — Encounter: Payer: Self-pay | Admitting: Certified Nurse Midwife

## 2014-11-19 ENCOUNTER — Ambulatory Visit: Payer: 59 | Admitting: Certified Nurse Midwife

## 2014-12-09 ENCOUNTER — Other Ambulatory Visit (INDEPENDENT_AMBULATORY_CARE_PROVIDER_SITE_OTHER): Payer: 59

## 2014-12-09 DIAGNOSIS — R7989 Other specified abnormal findings of blood chemistry: Secondary | ICD-10-CM

## 2014-12-09 NOTE — Progress Notes (Signed)
Lab only 

## 2014-12-10 LAB — VITAMIN D 25 HYDROXY (VIT D DEFICIENCY, FRACTURES): Vit D, 25-Hydroxy: 16.4 ng/mL — ABNORMAL LOW (ref 30.0–100.0)

## 2014-12-11 ENCOUNTER — Telehealth: Payer: Self-pay | Admitting: Pharmacist

## 2014-12-11 DIAGNOSIS — E559 Vitamin D deficiency, unspecified: Secondary | ICD-10-CM

## 2014-12-11 NOTE — Telephone Encounter (Signed)
Vitamin D a little better but still too low. ?compliance with vitamin D supplementation - suppose to take vitamin D 50,000IU weekly for last 3 months? Tried to call patient to confirm compliance - no answer but LM on VM to call office to discuss lab results.

## 2014-12-11 NOTE — Telephone Encounter (Signed)
Noted.  Recheck vitamin D in 3 months - 03/2015  Patient aware

## 2014-12-11 NOTE — Telephone Encounter (Signed)
She is going to have Vit D refilled. She did not know she was to continue for 3 mo.  Call her back if you have anymore questions.

## 2015-03-31 ENCOUNTER — Other Ambulatory Visit: Payer: Self-pay

## 2015-03-31 DIAGNOSIS — Z1231 Encounter for screening mammogram for malignant neoplasm of breast: Secondary | ICD-10-CM

## 2015-04-22 ENCOUNTER — Telehealth: Payer: Self-pay | Admitting: Certified Nurse Midwife

## 2015-04-22 NOTE — Telephone Encounter (Signed)
Left message on voicemail to call and reschedule cancelled appointment. °

## 2015-04-25 ENCOUNTER — Ambulatory Visit
Admission: RE | Admit: 2015-04-25 | Discharge: 2015-04-25 | Disposition: A | Discharge status: Home / Self Care | Payer: 59 | Source: Ambulatory Visit

## 2015-04-25 DIAGNOSIS — Z1231 Encounter for screening mammogram for malignant neoplasm of breast: Secondary | ICD-10-CM

## 2015-04-28 ENCOUNTER — Ambulatory Visit (INDEPENDENT_AMBULATORY_CARE_PROVIDER_SITE_OTHER): Payer: 59 | Admitting: Nurse Practitioner

## 2015-04-28 ENCOUNTER — Encounter: Payer: Self-pay | Admitting: Nurse Practitioner

## 2015-04-28 VITALS — BP 134/82 | HR 68 | Temp 98.8°F | Ht 64.0 in | Wt 179.2 lb

## 2015-04-28 DIAGNOSIS — M5432 Sciatica, left side: Secondary | ICD-10-CM | POA: Diagnosis not present

## 2015-04-28 DIAGNOSIS — R7989 Other specified abnormal findings of blood chemistry: Secondary | ICD-10-CM

## 2015-04-28 DIAGNOSIS — E785 Hyperlipidemia, unspecified: Secondary | ICD-10-CM | POA: Diagnosis not present

## 2015-04-28 DIAGNOSIS — E8881 Metabolic syndrome: Secondary | ICD-10-CM | POA: Diagnosis not present

## 2015-04-28 MED ORDER — ATORVASTATIN CALCIUM 40 MG PO TABS: 40.0000 mg | ORAL_TABLET | Freq: Every day | ORAL | Status: DC

## 2015-04-28 MED ORDER — MELOXICAM 15 MG PO TABS: 15.0000 mg | ORAL_TABLET | Freq: Every day | ORAL | Status: DC

## 2015-04-28 NOTE — Progress Notes (Signed)
Subjective:    Patient ID: Tanya Wilkinson, female    DOB: 1961/03/08, 54 y.o.   MRN: [id number]  Patient here today for follow up of chronic medical problems. C/O pain in left buttocks that radiates down the backof left thigh. Burning sensation- rates pain 7/10- getting up and walking around makes it better- sitting for  Long periods of time makes it worse. Heat also helps with pain.    Hyperlipidemia This is a chronic problem. The current episode started more than 1 year ago. The problem is controlled. She has no history of chronic renal disease, diabetes, hypothyroidism or liver disease. Pertinent negatives include no chest pain, focal sensory loss, focal weakness, leg pain, myalgias or shortness of breath. Current antihyperlipidemic treatment includes statins. The current treatment provides moderate improvement of lipids. There are no compliance problems.  Risk factors for coronary artery disease include dyslipidemia.  metabolic syndrome Watches diet. Does not check blood sugar at home. Vitamin d def Vitamin d def OTC daily- no side effects  Review of Systems  Constitutional: Negative.   HENT: Negative.   Eyes: Negative.   Respiratory: Negative.  Negative for shortness of breath.   Cardiovascular: Negative.  Negative for chest pain.  Gastrointestinal: Negative.   Endocrine: Negative.   Genitourinary: Negative.   Musculoskeletal: Negative.  Negative for myalgias.  Skin: Negative.   Allergic/Immunologic: Negative.   Neurological: Negative.  Negative for focal weakness.  Hematological: Negative.   Psychiatric/Behavioral: Negative.        Objective:   Physical Exam  Constitutional: She is oriented to person, place, and time. She appears well-developed and well-nourished.  HENT:  Head: Normocephalic.  Nose: Nose normal.  Mouth/Throat: Oropharynx is clear and moist.  Eyes: EOM are normal. Pupils are equal, round, and reactive to light.  Neck: Trachea normal, normal range of  motion and full passive range of motion without pain. Neck supple. No JVD present. Carotid bruit is not present. No thyromegaly present.  Cardiovascular: Normal rate, regular rhythm, normal heart sounds and intact distal pulses.  Exam reveals no gallop and no friction rub.   No murmur heard. Pulmonary/Chest: Effort normal and breath sounds normal.  Abdominal: Soft. Bowel sounds are normal. She exhibits no distension and no mass. There is no tenderness.  Musculoskeletal: Normal range of motion.  Lymphadenopathy:    She has no cervical adenopathy.  Neurological: She is alert and oriented to person, place, and time. She has normal reflexes.  Skin: Skin is warm and dry.  Psychiatric: She has a normal mood and affect. Her behavior is normal. Judgment and thought content normal.    BP 134/82 mmHg  Pulse 68  Temp(Src) 98.8 F (37.1 C) (Oral)  Ht '5\' 4"'  (1.626 m)  Wt 179 lb 3.2 oz (81.285 kg)  BMI 30.74 kg/m2  LMP 12/07/2007        Assessment & Plan:   1. Metabolic syndrome Watch carbs in diet  2. Low serum vitamin D - Vit D  25 hydroxy (rtn osteoporosis monitoring)  3. Hyperlipidemia with target LDL less than 100 Low fta diet - atorvastatin (LIPITOR) 40 MG tablet; Take 1 tablet (40 mg total) by mouth daily.  Dispense: 90 tablet; Refill: 1 - CMP14+EGFR - NMR, lipoprofile  4. Left sciatica Moist heat Stretches rto prn -mobic 56m 1 po QD #30 2 rf   Labs pending Health maintenance reviewed Diet and exercise encouraged Continue all meds Follow up  In 3 month   [place] FNP

## 2015-04-29 LAB — CMP14+EGFR
ALT: 23 IU/L (ref 0–32)
AST: 20 IU/L (ref 0–40)
Albumin/Globulin Ratio: 1.8 (ref 1.1–2.5)
Albumin: 4.4 g/dL (ref 3.5–5.5)
Alkaline Phosphatase: 126 IU/L — ABNORMAL HIGH (ref 39–117)
BILIRUBIN TOTAL: 0.4 mg/dL (ref 0.0–1.2)
BUN/Creatinine Ratio: 12 (ref 9–23)
BUN: 8 mg/dL (ref 6–24)
CO2: 24 mmol/L (ref 18–29)
Calcium: 9.8 mg/dL (ref 8.7–10.2)
Chloride: 101 mmol/L (ref 97–108)
Creatinine, Ser: 0.69 mg/dL (ref 0.57–1.00)
GFR calc non Af Amer: 98 mL/min/{1.73_m2} (ref >59)
GFR, EST AFRICAN AMERICAN: 113 mL/min/{1.73_m2} (ref >59)
GLOBULIN, TOTAL: 2.5 g/dL (ref 1.5–4.5)
Glucose: 111 mg/dL — ABNORMAL HIGH (ref 65–99)
POTASSIUM: 5.1 mmol/L (ref 3.5–5.2)
SODIUM: 142 mmol/L (ref 134–144)
Total Protein: 6.9 g/dL (ref 6.0–8.5)

## 2015-04-29 LAB — NMR, LIPOPROFILE
Cholesterol: 200 mg/dL — ABNORMAL HIGH (ref 100–199)
HDL CHOLESTEROL BY NMR: 43 mg/dL (ref >39)
HDL PARTICLE NUMBER: 35.5 umol/L (ref >=30.5)
LDL Particle Number: 1284 nmol/L — ABNORMAL HIGH (ref <1000)
LDL SIZE: 19.7 nm (ref >20.5)
LDL-C: 116 mg/dL — ABNORMAL HIGH (ref 0–99)
LP-IR Score: 79 — ABNORMAL HIGH (ref <=45)
Small LDL Particle Number: 930 nmol/L — ABNORMAL HIGH (ref <=527)
Triglycerides by NMR: 207 mg/dL — ABNORMAL HIGH (ref 0–149)

## 2015-04-29 LAB — VITAMIN D 25 HYDROXY (VIT D DEFICIENCY, FRACTURES): VIT D 25 HYDROXY: 12.3 ng/mL — AB (ref 30.0–100.0)

## 2015-06-20 ENCOUNTER — Ambulatory Visit: Payer: 59 | Admitting: Certified Nurse Midwife

## 2015-08-27 ENCOUNTER — Telehealth: Payer: 59 | Admitting: Family

## 2015-08-27 DIAGNOSIS — J309 Allergic rhinitis, unspecified: Secondary | ICD-10-CM | POA: Diagnosis not present

## 2015-08-28 MED ORDER — BENZONATATE 200 MG PO CAPS: 200.0000 mg | ORAL_CAPSULE | Freq: Three times a day (TID) | ORAL | Status: DC | PRN

## 2015-08-28 NOTE — Progress Notes (Signed)
We are sorry that you are not feeling well.  Here is how we plan to help!  Based on what you have shared with me it looks like you have upper respiratory tract inflammation that has resulted in a significant cough.  Inflammation and infection in the upper respiratory tract is commonly called bronchitis and has four common causes:  Allergies, Viral Infections, Acid Reflux and Bacterial Infections.  Allergies, viruses and acid reflux are treated by controlling symptoms or eliminating the cause. An example might be a cough caused by taking certain blood pressure medications. You stop the cough by changing the medication. Another example might be a cough caused by acid reflux. Controlling the reflux helps control the cough.  Based on your presentation I believe you most likely have A cough due to allergies.  I recommend that you start the an over-the counter-allergy medication such as Claritin 10 mg or Zyrtec 10 mg daily.    In addition you may use A non-prescription cough medication called Robitussin DAC. Take 2 teaspoons every 8 hours or Delsym: take 2 teaspoons every 12 hours., A non-prescription cough medication called Mucinex DM: take 2 tablets every 12 hours. and A prescription cough medication called Tessalon Perles 100mg. You may take 1-2 capsules every 8 hours as needed for your cough.    HOME CARE . Only take medications as instructed by your medical team. . Complete the entire course of an antibiotic. . Drink plenty of fluids and get plenty of rest. . Avoid close contacts especially the very young and the elderly . Cover your mouth if you cough or cough into your sleeve. . Always remember to wash your hands . A steam or ultrasonic humidifier can help congestion.    GET HELP RIGHT AWAY IF: . You develop worsening fever. . You become short of breath . You cough up blood. . Your symptoms persist after you have completed your treatment plan MAKE SURE YOU   Understand these  instructions.  Will watch your condition.  Will get help right away if you are not doing well or get worse.  Your e-visit answers were reviewed by a board certified advanced clinical practitioner to complete your personal care plan.  Depending on the condition, your plan could have included both over the counter or prescription medications. If there is a problem please reply  once you have received a response from your provider. Your safety is important to us.  If you have drug allergies check your prescription carefully.    You can use MyChart to ask questions about today's visit, request a non-urgent call back, or ask for a work or school excuse for 24 hours related to this e-Visit. If it has been greater than 24 hours you will need to follow up with your provider, or enter a new e-Visit to address those concerns. You will get an e-mail in the next two days asking about your experience.  I hope that your e-visit has been valuable and will speed your recovery. Thank you for using e-visits.   

## 2015-10-31 ENCOUNTER — Ambulatory Visit (INDEPENDENT_AMBULATORY_CARE_PROVIDER_SITE_OTHER): Payer: 59 | Admitting: Nurse Practitioner

## 2015-10-31 ENCOUNTER — Encounter: Payer: Self-pay | Admitting: Nurse Practitioner

## 2015-10-31 VITALS — BP 136/80 | HR 64 | Temp 97.5°F | Ht 64.0 in | Wt 178.0 lb

## 2015-10-31 DIAGNOSIS — Z6830 Body mass index (BMI) 30.0-30.9, adult: Secondary | ICD-10-CM

## 2015-10-31 DIAGNOSIS — R7989 Other specified abnormal findings of blood chemistry: Secondary | ICD-10-CM

## 2015-10-31 DIAGNOSIS — Z1159 Encounter for screening for other viral diseases: Secondary | ICD-10-CM

## 2015-10-31 DIAGNOSIS — Z1212 Encounter for screening for malignant neoplasm of rectum: Secondary | ICD-10-CM

## 2015-10-31 DIAGNOSIS — E785 Hyperlipidemia, unspecified: Secondary | ICD-10-CM | POA: Diagnosis not present

## 2015-10-31 DIAGNOSIS — E8881 Metabolic syndrome: Secondary | ICD-10-CM | POA: Diagnosis not present

## 2015-10-31 MED ORDER — ATORVASTATIN CALCIUM 40 MG PO TABS: 40.0000 mg | ORAL_TABLET | Freq: Every day | ORAL | Status: DC

## 2015-10-31 NOTE — Progress Notes (Signed)
Subjective:    Patient ID: Tanya Wilkinson, female    DOB: [birth year], 54 y.o.   MRN: [id number]  Patient here today for follow up of chronic medical problems. No complaints today.  * was seen several week ago with sciatica of left leg and was given was given NSAID which really helped.  Is currently having no problems  Hyperlipidemia This is a chronic problem. The current episode started more than 1 year ago. The problem is controlled. She has no history of chronic renal disease, diabetes, hypothyroidism or liver disease. Pertinent negatives include no chest pain, focal sensory loss, focal weakness, leg pain, myalgias or shortness of breath. Current antihyperlipidemic treatment includes statins. The current treatment provides moderate improvement of lipids. There are no compliance problems.  Risk factors for coronary artery disease include dyslipidemia.  metabolic syndrome Watches diet. Does not check blood sugar at home. Vitamin d def Vitamin d def OTC daily- no side effects  Review of Systems  Constitutional: Negative.   HENT: Negative.   Eyes: Negative.   Respiratory: Negative.  Negative for shortness of breath.   Cardiovascular: Negative.  Negative for chest pain.  Gastrointestinal: Negative.   Endocrine: Negative.   Genitourinary: Negative.   Musculoskeletal: Negative.  Negative for myalgias.  Skin: Negative.   Allergic/Immunologic: Negative.   Neurological: Negative.  Negative for focal weakness.  Hematological: Negative.   Psychiatric/Behavioral: Negative.        Objective:   Physical Exam  Constitutional: She is oriented to person, place, and time. She appears well-developed and well-nourished.  HENT:  Head: Normocephalic.  Nose: Nose normal.  Mouth/Throat: Oropharynx is clear and moist.  Eyes: EOM are normal. Pupils are equal, round, and reactive to light.  Neck: Trachea normal, normal range of motion and full passive range of motion without pain. Neck supple. No JVD  present. Carotid bruit is not present. No thyromegaly present.  Cardiovascular: Normal rate, regular rhythm, normal heart sounds and intact distal pulses.  Exam reveals no gallop and no friction rub.   No murmur heard. Pulmonary/Chest: Effort normal and breath sounds normal.  Abdominal: Soft. Bowel sounds are normal. She exhibits no distension and no mass. There is no tenderness.  Musculoskeletal: Normal range of motion.  Lymphadenopathy:    She has no cervical adenopathy.  Neurological: She is alert and oriented to person, place, and time. She has normal reflexes.  Skin: Skin is warm and dry.  Psychiatric: She has a normal mood and affect. Her behavior is normal. Judgment and thought content normal.    BP 136/80 mmHg  Pulse 64  Temp(Src) 97.5 F (36.4 C) (Oral)  Ht _0  (1.626 m)  Wt 178 lb (80.74 kg)  BMI 30.54 kg/m2  LMP 12/07/2007        Assessment & Plan:   1. Hyperlipidemia with target LDL less than 100 Low fat diet - atorvastatin (LIPITOR) 40 MG tablet; Take 1 tablet (40 mg total) by mouth daily.  Dispense: 90 tablet; Refill: 1 - CMP14+EGFR - Lipid panel  2. Metabolic syndrome Watch carbs in diet  3. Low serum vitamin D  4. BMI 30.0-30.9,adult Discussed diet and exercise for person with BMI >25 Will recheck weight in 3-6 months   5. Need for hepatitis C screening test - Hepatitis C antibody  6. Screening for malignant neoplasm of the rectum - Fecal occult blood, imunochemical; Future    Labs pending Health maintenance reviewed Diet and exercise encouraged Continue all meds Follow up  In 3  month   Mary-Margaret Hassell Done, FNP

## 2015-10-31 NOTE — Patient Instructions (Signed)
Health Maintenance, Female Adopting a healthy lifestyle and getting preventive care can go a long way to promote health and wellness. Talk with your health care provider about what schedule of regular examinations is right for you. This is a good chance for you to check in with your provider about disease prevention and staying healthy. In between checkups, there are plenty of things you can do on your own. Experts have done a lot of research about which lifestyle changes and preventive measures are most likely to keep you healthy. Ask your health care provider for more information. WEIGHT AND DIET  Eat a healthy diet  Be sure to include plenty of vegetables, fruits, low-fat dairy products, and lean protein.  Do not eat a lot of foods high in solid fats, added sugars, or salt.  Get regular exercise. This is one of the most important things you can do for your health.  Most adults should exercise for at least 150 minutes each week. The exercise should increase your heart rate and make you sweat (moderate-intensity exercise).  Most adults should also do strengthening exercises at least twice a week. This is in addition to the moderate-intensity exercise.  Maintain a healthy weight  Body mass index (BMI) is a measurement that can be used to identify possible weight problems. It estimates body fat based on height and weight. Your health care provider can help determine your BMI and help you achieve or maintain a healthy weight.  For females 50 years of age and older:   A BMI below 18.5 is considered underweight.  A BMI of 18.5 to 24.9 is normal.  A BMI of 25 to 29.9 is considered overweight.  A BMI of 30 and above is considered obese.  Watch levels of cholesterol and blood lipids  You should start having your blood tested for lipids and cholesterol at 56 years of age, then have this test every 5 years.  You may need to have your cholesterol levels checked more often if:  Your lipid  or cholesterol levels are high.  You are older than 56 years of age.  You are at high risk for heart disease.  CANCER SCREENING   Lung Cancer  Lung cancer screening is recommended for adults 53-76 years old who are at high risk for lung cancer because of a history of smoking.  A yearly low-dose CT scan of the lungs is recommended for people who:  Currently smoke.  Have quit within the past 15 years.  Have at least a 30-pack-year history of smoking. A pack year is smoking an average of one pack of cigarettes a day for 1 year.  Yearly screening should continue until it has been 15 years since you quit.  Yearly screening should stop if you develop a health problem that would prevent you from having lung cancer treatment.  Breast Cancer  Practice breast self-awareness. This means understanding how your breasts normally appear and feel.  It also means doing regular breast self-exams. Let your health care provider know about any changes, no matter how small.  If you are in your 20s or 30s, you should have a clinical breast exam (CBE) by a health care provider every 1-3 years as part of a regular health exam.  If you are 8 or older, have a CBE every year. Also consider having a breast X-ray (mammogram) every year.  If you have a family history of breast cancer, talk to your health care provider about genetic screening.  If you  are at high risk for breast cancer, talk to your health care provider about having an MRI and a mammogram every year.  Breast cancer gene (BRCA) assessment is recommended for women who have family members with BRCA-related cancers. BRCA-related cancers include:  Breast.  Ovarian.  Tubal.  Peritoneal cancers.  Results of the assessment will determine the need for genetic counseling and BRCA1 and BRCA2 testing. Cervical Cancer Your health care provider may recommend that you be screened regularly for cancer of the pelvic organs (ovaries, uterus, and  vagina). This screening involves a pelvic examination, including checking for microscopic changes to the surface of your cervix (Pap test). You may be encouraged to have this screening done every 3 years, beginning at age 1.  For women ages 25-65, health care providers may recommend pelvic exams and Pap testing every 3 years, or they may recommend the Pap and pelvic exam, combined with testing for human papilloma virus (HPV), every 5 years. Some types of HPV increase your risk of cervical cancer. Testing for HPV may also be done on women of any age with unclear Pap test results.  Other health care providers may not recommend any screening for nonpregnant women who are considered low risk for pelvic cancer and who do not have symptoms. Ask your health care provider if a screening pelvic exam is right for you.  If you have had past treatment for cervical cancer or a condition that could lead to cancer, you need Pap tests and screening for cancer for at least 20 years after your treatment. If Pap tests have been discontinued, your risk factors (such as having a new sexual partner) need to be reassessed to determine if screening should resume. Some women have medical problems that increase the chance of getting cervical cancer. In these cases, your health care provider may recommend more frequent screening and Pap tests. Colorectal Cancer  This type of cancer can be detected and often prevented.  Routine colorectal cancer screening usually begins at 56 years of age and continues through 56 years of age.  Your health care provider may recommend screening at an earlier age if you have risk factors for colon cancer.  Your health care provider may also recommend using home test kits to check for hidden blood in the stool.  A small camera at the end of a tube can be used to examine your colon directly (sigmoidoscopy or colonoscopy). This is done to check for the earliest forms of colorectal  cancer.  Routine screening usually begins at age 32.  Direct examination of the colon should be repeated every 5-10 years through 56 years of age. However, you may need to be screened more often if early forms of precancerous polyps or small growths are found. Skin Cancer  Check your skin from head to toe regularly.  Tell your health care provider about any new moles or changes in moles, especially if there is a change in a mole's shape or color.  Also tell your health care provider if you have a mole that is larger than the size of a pencil eraser.  Always use sunscreen. Apply sunscreen liberally and repeatedly throughout the day.  Protect yourself by wearing long sleeves, pants, a wide-brimmed hat, and sunglasses whenever you are outside. HEART DISEASE, DIABETES, AND HIGH BLOOD PRESSURE   High blood pressure causes heart disease and increases the risk of stroke. High blood pressure is more likely to develop in:  People who have blood pressure in the high end  of the normal range (130-139/85-89 mm Hg).  People who are overweight or obese.  People who are African American.  If you are 38-23 years of age, have your blood pressure checked every 3-5 years. If you are 61 years of age or older, have your blood pressure checked every year. You should have your blood pressure measured twice--once when you are at a hospital or clinic, and once when you are not at a hospital or clinic. Record the average of the two measurements. To check your blood pressure when you are not at a hospital or clinic, you can use:  An automated blood pressure machine at a pharmacy.  A home blood pressure monitor.  If you are between 45 years and 39 years old, ask your health care provider if you should take aspirin to prevent strokes.  Have regular diabetes screenings. This involves taking a blood sample to check your fasting blood sugar level.  If you are at a normal weight and have a low risk for diabetes,  have this test once every three years after 56 years of age.  If you are overweight and have a high risk for diabetes, consider being tested at a younger age or more often. PREVENTING INFECTION  Hepatitis B  If you have a higher risk for hepatitis B, you should be screened for this virus. You are considered at high risk for hepatitis B if:  You were born in a country where hepatitis B is common. Ask your health care provider which countries are considered high risk.  Your parents were born in a high-risk country, and you have not been immunized against hepatitis B (hepatitis B vaccine).  You have HIV or AIDS.  You use needles to inject street drugs.  You live with someone who has hepatitis B.  You have had sex with someone who has hepatitis B.  You get hemodialysis treatment.  You take certain medicines for conditions, including cancer, organ transplantation, and autoimmune conditions. Hepatitis C  Blood testing is recommended for:  Everyone born from [birth year] through 1967.  Anyone with known risk factors for hepatitis C. Sexually transmitted infections (STIs)  You should be screened for sexually transmitted infections (STIs) including gonorrhea and chlamydia if:  You are sexually active and are younger than 56 years of age.  You are older than 56 years of age and your health care provider tells you that you are at risk for this type of infection.  Your sexual activity has changed since you were last screened and you are at an increased risk for chlamydia or gonorrhea. Ask your health care provider if you are at risk.  If you do not have HIV, but are at risk, it may be recommended that you take a prescription medicine daily to prevent HIV infection. This is called pre-exposure prophylaxis (PrEP). You are considered at risk if:  You are sexually active and do not regularly use condoms or know the HIV status of your partner(s).  You take drugs by injection.  You are sexually  active with a partner who has HIV. Talk with your health care provider about whether you are at high risk of being infected with HIV. If you choose to begin PrEP, you should first be tested for HIV. You should then be tested every 3 months for as long as you are taking PrEP.  PREGNANCY   If you are premenopausal and you may become pregnant, ask your health care provider about preconception counseling.  If you may  become pregnant, take 400 to 800 micrograms (mcg) of folic acid every day.  If you want to prevent pregnancy, talk to your health care provider about birth control (contraception). OSTEOPOROSIS AND MENOPAUSE   Osteoporosis is a disease in which the bones lose minerals and strength with aging. This can result in serious bone fractures. Your risk for osteoporosis can be identified using a bone density scan.  If you are 61 years of age or older, or if you are at risk for osteoporosis and fractures, ask your health care provider if you should be screened.  Ask your health care provider whether you should take a calcium or vitamin D supplement to lower your risk for osteoporosis.  Menopause may have certain physical symptoms and risks.  Hormone replacement therapy may reduce some of these symptoms and risks. Talk to your health care provider about whether hormone replacement therapy is right for you.  HOME CARE INSTRUCTIONS   Schedule regular health, dental, and eye exams.  Stay current with your immunizations.   Do not use any tobacco products including cigarettes, chewing tobacco, or electronic cigarettes.  If you are pregnant, do not drink alcohol.  If you are breastfeeding, limit how much and how often you drink alcohol.  Limit alcohol intake to no more than 1 drink per day for nonpregnant women. One drink equals 12 ounces of beer, 5 ounces of wine, or 1 ounces of hard liquor.  Do not use street drugs.  Do not share needles.  Ask your health care provider for help if  you need support or information about quitting drugs.  Tell your health care provider if you often feel depressed.  Tell your health care provider if you have ever been abused or do not feel safe at home.   This information is not intended to replace advice given to you by your health care provider. Make sure you discuss any questions you have with your health care provider.   Document Released: 06/07/2011 Document Revised: 12/13/2014 Document Reviewed: 10/24/2013 Elsevier Interactive Patient Education [employer].

## 2015-11-01 LAB — CMP14+EGFR
ALK PHOS: 122 IU/L — AB (ref 39–117)
ALT: 13 IU/L (ref 0–32)
AST: 16 IU/L (ref 0–40)
Albumin/Globulin Ratio: 1.7 (ref 1.1–2.5)
Albumin: 4.3 g/dL (ref 3.5–5.5)
BUN/Creatinine Ratio: 14 (ref 9–23)
BUN: 10 mg/dL (ref 6–24)
Bilirubin Total: 0.5 mg/dL (ref 0.0–1.2)
CO2: 25 mmol/L (ref 18–29)
CREATININE: 0.69 mg/dL (ref 0.57–1.00)
Calcium: 9.6 mg/dL (ref 8.7–10.2)
Chloride: 101 mmol/L (ref 97–106)
GFR calc Af Amer: 113 mL/min/{1.73_m2} (ref >59)
GFR calc non Af Amer: 98 mL/min/{1.73_m2} (ref >59)
GLOBULIN, TOTAL: 2.5 g/dL (ref 1.5–4.5)
Glucose: 97 mg/dL (ref 65–99)
Potassium: 4.6 mmol/L (ref 3.5–5.2)
SODIUM: 143 mmol/L (ref 136–144)
Total Protein: 6.8 g/dL (ref 6.0–8.5)

## 2015-11-01 LAB — LIPID PANEL
CHOL/HDL RATIO: 4.7 ratio — AB (ref 0.0–4.4)
CHOLESTEROL TOTAL: 191 mg/dL (ref 100–199)
HDL: 41 mg/dL (ref >39)
LDL Calculated: 109 mg/dL — ABNORMAL HIGH (ref 0–99)
TRIGLYCERIDES: 204 mg/dL — AB (ref 0–149)
VLDL Cholesterol Cal: 41 mg/dL — ABNORMAL HIGH (ref 5–40)

## 2015-11-01 LAB — HEPATITIS C ANTIBODY: Hep C Virus Ab: <0.1 s/co ratio (ref 0.0–0.9)

## 2015-11-03 ENCOUNTER — Ambulatory Visit: Payer: 59 | Admitting: Nurse Practitioner

## 2015-11-26 ENCOUNTER — Telehealth: Payer: 59 | Admitting: Family

## 2015-11-26 DIAGNOSIS — J309 Allergic rhinitis, unspecified: Secondary | ICD-10-CM | POA: Diagnosis not present

## 2015-11-26 DIAGNOSIS — R059 Cough, unspecified: Secondary | ICD-10-CM

## 2015-11-26 DIAGNOSIS — R05 Cough: Secondary | ICD-10-CM

## 2015-11-26 MED ORDER — BENZONATATE 200 MG PO CAPS: 200.0000 mg | ORAL_CAPSULE | Freq: Three times a day (TID) | ORAL | Status: DC | PRN

## 2015-11-26 MED ORDER — FLUTICASONE PROPIONATE 50 MCG/ACT NA SUSP: 2.0000 | Freq: Every day | NASAL | Status: DC

## 2015-11-26 NOTE — Progress Notes (Signed)
We are sorry that you are not feeling well.  Here is how we plan to help!  Based on what you have shared with me it looks like you have upper respiratory tract inflammation that has resulted in a significant cough.  Inflammation and infection in the upper respiratory tract is commonly called bronchitis and has four common causes:  Allergies, Viral Infections, Acid Reflux and Bacterial Infections.  Allergies, viruses and acid reflux are treated by controlling symptoms or eliminating the cause. An example might be a cough caused by taking certain blood pressure medications. You stop the cough by changing the medication. Another example might be a cough caused by acid reflux. Controlling the reflux helps control the cough.  Based on your presentation I believe you most likely have A cough due to allergies.  I recommend that you start the an over-the counter-allergy medication such as Claritin 10 mg or Zyrtec 10 mg daily.  Flonase nasal spray, 2 sprays in each nostril.    In addition you may use A non-prescription cough medication called Robitussin DAC. Take 2 teaspoons every 8 hours or Delsym: take 2 teaspoons every 12 hours., A non-prescription cough medication called Mucinex DM: take 2 tablets every 12 hours. and A prescription cough medication called Tessalon Perles 100mg . You may take 1-2 capsules every 8 hours as needed for your cough.    HOME CARE . Only take medications as instructed by your medical team. . Complete the entire course of an antibiotic. . Drink plenty of fluids and get plenty of rest. . Avoid close contacts especially the very young and the elderly . Cover your mouth if you cough or cough into your sleeve. . Always remember to wash your hands . A steam or ultrasonic humidifier can help congestion.    GET HELP RIGHT AWAY IF: . You develop worsening fever. . You become short of breath . You cough up blood. . Your symptoms persist after you have completed your treatment  plan MAKE SURE YOU   Understand these instructions.  Will watch your condition.  Will get help right away if you are not doing well or get worse.  Your e-visit answers were reviewed by a board certified advanced clinical practitioner to complete your personal care plan.  Depending on the condition, your plan could have included both over the counter or prescription medications. If there is a problem please reply  once you have received a response from your provider. Your safety is important to [place].  If you have drug allergies check your prescription carefully.    You can use MyChart to ask questions about today's visit, request a non-urgent call back, or ask for a work or school excuse for 24 hours related to this e-Visit. If it has been greater than 24 hours you will need to follow up with your provider, or enter a new e-Visit to address those concerns. You will get an e-mail in the next two days asking about your experience.  I hope that your e-visit has been valuable and will speed your recovery. Thank you for using e-visits.

## 2016-06-29 ENCOUNTER — Encounter: Payer: Self-pay | Admitting: Nurse Practitioner

## 2016-06-29 ENCOUNTER — Ambulatory Visit: Payer: 59

## 2016-06-29 ENCOUNTER — Other Ambulatory Visit: Payer: Self-pay | Admitting: Certified Nurse Midwife

## 2016-06-29 ENCOUNTER — Ambulatory Visit (INDEPENDENT_AMBULATORY_CARE_PROVIDER_SITE_OTHER): Payer: 59 | Admitting: Nurse Practitioner

## 2016-06-29 VITALS — BP 141/79 | HR 63 | Temp 97.4°F | Ht 64.0 in | Wt 165.0 lb

## 2016-06-29 DIAGNOSIS — E8881 Metabolic syndrome: Secondary | ICD-10-CM

## 2016-06-29 DIAGNOSIS — E785 Hyperlipidemia, unspecified: Secondary | ICD-10-CM

## 2016-06-29 DIAGNOSIS — Z1212 Encounter for screening for malignant neoplasm of rectum: Secondary | ICD-10-CM | POA: Diagnosis not present

## 2016-06-29 DIAGNOSIS — Z6828 Body mass index (BMI) 28.0-28.9, adult: Secondary | ICD-10-CM | POA: Diagnosis not present

## 2016-06-29 DIAGNOSIS — M5441 Lumbago with sciatica, right side: Secondary | ICD-10-CM

## 2016-06-29 DIAGNOSIS — R7989 Other specified abnormal findings of blood chemistry: Secondary | ICD-10-CM | POA: Diagnosis not present

## 2016-06-29 DIAGNOSIS — Z1231 Encounter for screening mammogram for malignant neoplasm of breast: Secondary | ICD-10-CM

## 2016-06-29 LAB — CMP14+EGFR
ALBUMIN: 4.3 g/dL (ref 3.5–5.5)
ALK PHOS: 109 IU/L (ref 39–117)
ALT: 9 IU/L (ref 0–32)
AST: 15 IU/L (ref 0–40)
Albumin/Globulin Ratio: 1.7 (ref 1.2–2.2)
BUN / CREAT RATIO: 14 (ref 9–23)
BUN: 9 mg/dL (ref 6–24)
CHLORIDE: 100 mmol/L (ref 96–106)
CO2: 25 mmol/L (ref 18–29)
CREATININE: 0.64 mg/dL (ref 0.57–1.00)
Calcium: 9.7 mg/dL (ref 8.7–10.2)
GFR calc Af Amer: 115 mL/min/{1.73_m2} (ref >59)
GFR calc non Af Amer: 99 mL/min/{1.73_m2} (ref >59)
GLOBULIN, TOTAL: 2.5 g/dL (ref 1.5–4.5)
GLUCOSE: 91 mg/dL (ref 65–99)
Potassium: 4.4 mmol/L (ref 3.5–5.2)
SODIUM: 141 mmol/L (ref 134–144)
Total Protein: 6.8 g/dL (ref 6.0–8.5)

## 2016-06-29 LAB — LIPID PANEL
CHOLESTEROL TOTAL: 258 mg/dL — AB (ref 100–199)
Chol/HDL Ratio: 6.3 ratio units — ABNORMAL HIGH (ref 0.0–4.4)
HDL: 41 mg/dL (ref >39)
LDL Calculated: 161 mg/dL — ABNORMAL HIGH (ref 0–99)
TRIGLYCERIDES: 282 mg/dL — AB (ref 0–149)
VLDL Cholesterol Cal: 56 mg/dL — ABNORMAL HIGH (ref 5–40)

## 2016-06-29 MED ORDER — MELOXICAM 15 MG PO TABS: 15.0000 mg | ORAL_TABLET | Freq: Every day | ORAL | 3 refills | Status: DC

## 2016-06-29 MED ORDER — CYCLOBENZAPRINE HCL 10 MG PO TABS: 10.0000 mg | ORAL_TABLET | Freq: Three times a day (TID) | ORAL | 1 refills | Status: DC | PRN

## 2016-06-29 MED ORDER — ATORVASTATIN CALCIUM 40 MG PO TABS: 40.0000 mg | ORAL_TABLET | Freq: Every day | ORAL | 1 refills | Status: DC

## 2016-06-29 MED ORDER — METHYLPREDNISOLONE ACETATE 80 MG/ML IJ SUSP
80.0000 mg | Freq: Once | INTRAMUSCULAR | Status: AC
  Administered 2016-06-29: 80 mg via INTRAMUSCULAR

## 2016-06-29 MED FILL — ATORVASTATIN 40 MG TABLET: 40 | 90 days supply | Qty: 90 | Fill #0

## 2016-06-29 NOTE — Patient Instructions (Signed)

## 2016-06-29 NOTE — Progress Notes (Signed)
Subjective:    Patient ID: Tanya Wilkinson, female    DOB: 05/01/1961, 55 y.o.   MRN: [id number]  Patient here today for follow up of chronic medical problems.     Back Pain  This is a recurrent problem. The current episode started in the past 7 days. The problem occurs intermittently. The problem has been gradually worsening since onset. The pain is present in the lumbar spine. The quality of the pain is described as aching and stabbing. The pain radiates to the right thigh. The pain is at a severity of 8/10. The pain is moderate. The pain is worse during the day. The symptoms are aggravated by sitting. Associated symptoms include leg pain. Pertinent negatives include no chest pain, numbness, paresis, paresthesias, tingling or weakness. She has tried NSAIDs for the symptoms. The treatment provided mild relief.  Hyperlipidemia  This is a chronic problem. The current episode started more than 1 year ago. The problem is controlled. She has no history of chronic renal disease, diabetes, hypothyroidism or liver disease. Associated symptoms include leg pain. Pertinent negatives include no chest pain, focal sensory loss, focal weakness, myalgias or shortness of breath. Current antihyperlipidemic treatment includes statins. The current treatment provides moderate improvement of lipids. There are no compliance problems.  Risk factors for coronary artery disease include dyslipidemia.  metabolic syndrome Watches diet. Does not check blood sugar at home. Vitamin d def patinet is currently not taking anything now.  Review of Systems  Constitutional: Negative.   HENT: Negative.   Eyes: Negative.   Respiratory: Negative.  Negative for shortness of breath.   Cardiovascular: Negative.  Negative for chest pain.  Gastrointestinal: Negative.   Endocrine: Negative.   Genitourinary: Negative.   Musculoskeletal: Positive for back pain. Negative for myalgias.  Skin: Negative.   Allergic/Immunologic: Negative.     Neurological: Negative.  Negative for tingling, focal weakness, weakness, numbness and paresthesias.  Hematological: Negative.   Psychiatric/Behavioral: Negative.        Objective:   Physical Exam  Constitutional: She is oriented to person, place, and time. She appears well-developed and well-nourished.  HENT:  Head: Normocephalic.  Nose: Nose normal.  Mouth/Throat: Oropharynx is clear and moist.  Eyes: EOM are normal. Pupils are equal, round, and reactive to light.  Neck: Trachea normal, normal range of motion and full passive range of motion without pain. Neck supple. No JVD present. Carotid bruit is not present. No thyromegaly present.  Cardiovascular: Normal rate, regular rhythm, normal heart sounds and intact distal pulses.  Exam reveals no gallop and no friction rub.   No murmur heard. Pulmonary/Chest: Effort normal and breath sounds normal.  Abdominal: Soft. Bowel sounds are normal. She exhibits no distension and no mass. There is no tenderness.  Musculoskeletal: Normal range of motion.  FROM of lumbar spine with pain going from laying to sitting (-) SLR bil Motor strength and sensation distally intact  Lymphadenopathy:    She has no cervical adenopathy.  Neurological: She is alert and oriented to person, place, and time. She has normal reflexes.  Skin: Skin is warm and dry.  Psychiatric: She has a normal mood and affect. Her behavior is normal. Judgment and thought content normal.    BP (!) 141/79   Pulse 63   Temp 97.4 F (36.3 C) (Oral)   Ht 5' 4" (1.626 m)   Wt 165 lb (74.8 kg)   LMP 12/07/2007   BMI 28.32 kg/m         Assessment &  Plan:   1. BMI 28.0-28.9,adult Discussed diet and exercise for person with BMI >25 Will recheck weight in 3-6 months  2. Hyperlipidemia with target LDL less than 100 Low fat diet - Lipid panel - atorvastatin (LIPITOR) 40 MG tablet; Take 1 tablet (40 mg total) by mouth daily.  Dispense: 90 tablet; Refill: 1  3. Metabolic  syndrome Labs pending - CMP14+EGFR  4. Low serum vitamin D Need to get back on vitamin d daily  5. Right-sided low back pain with right-sided sciatica Moist heat Rest  Back stretches in AM before getting out of bed - cyclobenzaprine (FLEXERIL) 10 MG tablet; Take 1 tablet (10 mg total) by mouth 3 (three) times daily as needed for muscle spasms.  Dispense: 30 tablet; Refill: 1 - meloxicam (MOBIC) 15 MG tablet; Take 1 tablet (15 mg total) by mouth daily.  Dispense: 30 tablet; Refill: 3 - methylPREDNISolone acetate (DEPO-MEDROL) injection 80 mg; Inject 1 mL (80 mg total) into the muscle once.  6. Screening for malignant neoplasm of the rectum - Fecal occult blood, imunochemical; Future    Labs pending Health maintenance reviewed Diet and exercise encouraged Continue all meds Follow up  In 6 months and prn   Mary-Margaret Hassell Done, FNP

## 2016-07-08 ENCOUNTER — Encounter: Payer: Self-pay | Admitting: Certified Nurse Midwife

## 2016-07-08 ENCOUNTER — Ambulatory Visit
Admission: RE | Admit: 2016-07-08 | Discharge: 2016-07-08 | Disposition: A | Discharge status: Home / Self Care | Payer: 59 | Source: Ambulatory Visit | Attending: Certified Nurse Midwife | Admitting: Certified Nurse Midwife

## 2016-07-08 ENCOUNTER — Ambulatory Visit (INDEPENDENT_AMBULATORY_CARE_PROVIDER_SITE_OTHER): Payer: 59 | Admitting: Certified Nurse Midwife

## 2016-07-08 VITALS — BP 120/80 | HR 64 | Resp 12 | Ht 62.5 in | Wt 162.0 lb

## 2016-07-08 DIAGNOSIS — Z23 Encounter for immunization: Secondary | ICD-10-CM

## 2016-07-08 DIAGNOSIS — Z01419 Encounter for gynecological examination (general) (routine) without abnormal findings: Secondary | ICD-10-CM

## 2016-07-08 DIAGNOSIS — Z124 Encounter for screening for malignant neoplasm of cervix: Secondary | ICD-10-CM

## 2016-07-08 DIAGNOSIS — Z1231 Encounter for screening mammogram for malignant neoplasm of breast: Secondary | ICD-10-CM

## 2016-07-08 NOTE — Progress Notes (Signed)
55 y.o. [id number] Married  Caucasian Fe here for annual exam. Menopausal no vaginal bleeding or vaginal dryness. Patient seeing PCP at [facility] for labs, aex, and cholesterol management. Takes Flexeril for occasional back pain, PCP manages. No health issues today. Busy with work and garden!  Patient's last menstrual period was 12/07/2007.          Sexually active: Yes.    The current method of family planning is post menopausal status.    Exercising: Yes.    Walking Smoker:  no  Health Maintenance: Pap:  11-07-13 neg HPV HR neg MMG:  04-25-15 category d density birads 1:neg Scheduled for today.  Colonoscopy:  09/18/2014. Polyp. Repeat 5 years.  BMD:   2015 TDaP:  2007 Shingles: no Pneumonia: no Hep C and HIV: 10/31/2015 Labs: PCP   reports that she quit smoking about 21 years ago. She has never used smokeless tobacco. She reports that she does not drink alcohol or use drugs.  Past Medical History:  Diagnosis Date  . Herniated disc   . Hyperlipidemia   . Vitamin D deficiency     Past Surgical History:  Procedure Laterality Date  . ANTERIOR CERVICAL DECOMP/DISCECTOMY FUSION  08/2006  . CESAREAN SECTION  [id number]  . COLONOSCOPY  [facility] in [place]   . TUBAL LIGATION  [id number]   BTL    Current Outpatient Prescriptions  Medication Sig Dispense Refill  . atorvastatin (LIPITOR) 40 MG tablet Take 1 tablet (40 mg total) by mouth daily. 90 tablet 1  . Calcium Carb-Cholecalciferol (CALCIUM + D3) 600-200 MG-UNIT TABS Take 1 tablet by mouth daily.    . cyclobenzaprine (FLEXERIL) 10 MG tablet Take 1 tablet (10 mg total) by mouth 3 (three) times daily as needed for muscle spasms. 30 tablet 1  . meloxicam (MOBIC) 15 MG tablet Take 1 tablet (15 mg total) by mouth daily. 30 tablet 3  . Multiple Vitamins-Minerals (CENTRUM SILVER PO) Take 1 tablet by mouth daily.     No current facility-administered medications for this visit.     Family History  Problem Relation Age of  Onset  . Diabetes Mother   . Hypertension Mother   . Cancer Father     throat & lung-smoker  . Stroke Father   . Stroke Brother   . Hypertension Brother   . Stroke Brother   . Early death Brother 54  . Colon cancer Neg Hx   . Pancreatic cancer Neg Hx   . Stomach cancer Neg Hx   . Esophageal cancer Neg Hx     ROS:  Pertinent items are noted in HPI.  Otherwise, a comprehensive ROS was negative.  Exam:   BP 120/80 (BP Location: Right Arm, Patient Position: Sitting, Cuff Size: Normal)   Pulse 64   Resp 12   Ht 5' 2.5" (1.588 m)   Wt 162 lb (73.5 kg)   LMP 12/07/2007   BMI 29.16 kg/m  Height: 5' 2.5" (158.8 cm) Ht Readings from Last 3 Encounters:  07/08/16 5' 2.5" (1.588 m)  06/29/16 5\' 4"  (1.626 m)  10/31/15 5\' 4"  (1.626 m)    General appearance: alert, cooperative and appears stated age Head: Normocephalic, without obvious abnormality, atraumatic Neck: no adenopathy, supple, symmetrical, trachea midline and thyroid normal to inspection and palpation Lungs: clear to auscultation bilaterally Breasts: normal appearance, no masses or tenderness, No nipple retraction or dimpling, No nipple discharge or bleeding, No axillary or supraclavicular adenopathy Heart: regular rate and rhythm Abdomen: soft, non-tender;  no masses,  no organomegaly Extremities: extremities normal, atraumatic, no cyanosis or edema Skin: Skin color, texture, turgor normal. No rashes or lesions Lymph nodes: Cervical, supraclavicular, and axillary nodes normal. No abnormal inguinal nodes palpated Neurologic: Grossly normal   Pelvic: External genitalia:  no lesions              Urethra:  normal appearing urethra with no masses, tenderness or lesions              Bartholin's and Skene's: normal                 Vagina: normal appearing vagina with normal color and discharge, no lesions              Cervix: no cervical motion tenderness, no lesions and normal appearance              Pap taken: Yes.    Bimanual Exam:  Uterus:  normal size, contour, position, consistency, mobility, non-tender              Adnexa: normal adnexa and no mass, fullness, tenderness               Rectovaginal: Confirms               Anus:  normal sphincter tone, no lesions  Chaperone present: yes  A:  Well Woman with normal exam  Menopausal no HRT  Cholesterol management with PCP  Immunization update  P:   Reviewed health and wellness pertinent to exam  Aware of need to evaluate if vaginal bleeding  Continue follow up with PCP as indicated  Requests TDAP  Pap smear as above with HPVHR   counseled on breast self exam, mammography screening, adequate intake of calcium and vitamin D, diet and exercise  return annually or prn  An After Visit Summary was printed and given to the patient.

## 2016-07-08 NOTE — Patient Instructions (Signed)

## 2016-07-09 NOTE — Progress Notes (Signed)
Encounter reviewed Romond Pipkins, MD   

## 2016-07-12 LAB — IPS PAP TEST WITH REFLEX TO HPV

## 2016-07-16 ENCOUNTER — Encounter: Payer: Self-pay | Admitting: Family

## 2016-07-16 ENCOUNTER — Ambulatory Visit (INDEPENDENT_AMBULATORY_CARE_PROVIDER_SITE_OTHER): Payer: 59 | Admitting: Family

## 2016-07-16 VITALS — BP 128/79 | HR 86 | Temp 97.6°F | Ht 62.5 in | Wt 159.4 lb

## 2016-07-16 DIAGNOSIS — B351 Tinea unguium: Secondary | ICD-10-CM | POA: Diagnosis not present

## 2016-07-16 MED ORDER — TERBINAFINE HCL 250 MG PO TABS: 250.0000 mg | ORAL_TABLET | Freq: Every day | ORAL | 0 refills | Status: DC

## 2016-07-16 MED FILL — TERBINAFINE HCL 250 MG TAB: 250 | 90 days supply | Qty: 90 | Fill #0

## 2016-07-16 NOTE — Patient Instructions (Signed)

## 2016-07-16 NOTE — Progress Notes (Signed)
   Subjective:    Patient ID: Tanya Wilkinson, female    DOB: 04-20-1961, 55 y.o.   MRN: [id number]  HPI Pt presents to the office today right toenail soreness that started about a week ago. PT states she noticed yellow discharge on the corner of her great toe. PT states she is having intermittent pain of 10 out 10 if she gets hit or bumped on. Pt has had ingrown toenails in the past that resolved. PT is complaining of thick toenails. PT has tried antibiotic ointment without relief.    Review of Systems  All other systems reviewed and are negative.      Objective:   Physical Exam  Constitutional: She appears well-developed and well-nourished. No distress.  HENT:  Head: Normocephalic.  Cardiovascular: Normal rate, regular rhythm, normal heart sounds and intact distal pulses.   No murmur heard. Pulmonary/Chest: Effort normal and breath sounds normal. No respiratory distress. She has no wheezes.  Abdominal: Soft. Bowel sounds are normal. She exhibits no distension. There is no tenderness.  Musculoskeletal: Normal range of motion. She exhibits no edema or tenderness.  Skin: Skin is warm and dry.  Right great toenail, left great toenail, third middle toenail thick and discolored. No redness or discharge present  Vitals reviewed.     BP (!) 159/87   Pulse 86   Temp 97.6 F (36.4 C) (Oral)   Ht 5' 2.5" (1.588 m)   Wt 159 lb 6 oz (72.3 kg)   LMP 12/07/2007   BMI 28.69 kg/m      Assessment & Plan:  1. Onychomycosis of toenail -Take daily -Change socks daily and clean shoes not to spread -CMP reviewed that she had on 06/29/16 -RTO in 3 months or sooner if pain becomes worse. May need to have toenail removed If pt hast redness drainage call and I will send in antibiotic  - terbinafine (LAMISIL) 250 MG tablet; Take 1 tablet (250 mg total) by mouth daily.  Dispense: 90 tablet; Refill: 0  Evelina Dun, FNP

## 2016-07-30 MED FILL — MELOXICAM 15 MG TABLET: 15 | 30 days supply | Qty: 30 | Fill #0

## 2016-09-13 ENCOUNTER — Encounter: Payer: Self-pay | Admitting: Pediatrics

## 2016-09-13 ENCOUNTER — Ambulatory Visit (INDEPENDENT_AMBULATORY_CARE_PROVIDER_SITE_OTHER): Payer: 59 | Admitting: Pediatrics

## 2016-09-13 VITALS — BP 126/78 | HR 82 | Temp 97.6°F | Ht 62.5 in | Wt 157.6 lb

## 2016-09-13 DIAGNOSIS — M5432 Sciatica, left side: Secondary | ICD-10-CM

## 2016-09-13 MED ORDER — PREDNISONE 20 MG PO TABS: ORAL_TABLET | ORAL | 0 refills | Status: DC

## 2016-09-13 NOTE — Progress Notes (Signed)
  Subjective:   Patient ID: Tanya Wilkinson, female    DOB: 05-12-1961, 55 y.o.   MRN: [id number] CC: Leg Pain (Left)  HPI: Tanya Wilkinson is a 55 y.o. female presenting for Leg Pain (Left)  Recently picked up 2yo grandson several times Started having pain 2 days ago yesterday morning even more pain Now hurting from low back down L leg Started taking muscle relaxers, helping some Taking meloxicam 15mg  once a day  Has had low back pain with sciatica before This hurts more than it did last time Now with tightness in L leg down back of leg as well  Relevant past medical, surgical, family and social history reviewed. Allergies and medications reviewed and updated. History  Smoking Status  . Former Smoker  . Quit date: 12/06/1994  Smokeless Tobacco  . Never Used   ROS: Per HPI   Objective:    BP 126/78   Pulse 82   Temp 97.6 F (36.4 C) (Oral)   Ht 5' 2.5" (1.588 m)   Wt 157 lb 9.6 oz (71.5 kg)   LMP 12/07/2007   BMI 28.37 kg/m   Wt Readings from Last 3 Encounters:  09/13/16 157 lb 9.6 oz (71.5 kg)  07/16/16 159 lb 6 oz (72.3 kg)  07/08/16 162 lb (73.5 kg)    Gen: NAD, alert, cooperative with exam, NCAT EYES: EOMI, no conjunctival injection, or no icterus CV: NRRR, normal S1/S2, no murmur, distal pulses 2+ b/l Resp: CTABL, no wheezes, normal WOB Ext: No edema, warm Neuro: Alert and oriented, strength equal b/l LE with knee flex/ext, hip flex. 2+ patellar reflex b/l MSK: no point tenderness over spine, normal ROM with twisting, back flexion, ext. +SLR L side  Assessment & Plan:  [place] was seen today for leg pain due to sciatica, exacerbated by recent heavy lifting. Discussed NSAIDs, rest, back exercises.  If no improvement in 1 week acan take PO steroids. Discussed steroids can cause longterm problems, should not regularly use for back pain. Pt in agreement, wants to have available because helped last time.  Diagnoses and all orders for this visit:  Sciatica of left  side -     predniSONE (DELTASONE) 20 MG tablet; 2 po at same time daily for 3 days   Follow up plan: Return if symptoms worsen or fail to improve. Assunta Found, MD [place]

## 2016-09-13 NOTE — Patient Instructions (Addendum)
Sciatica With Rehab The sciatic nerve runs from the back down the leg and is responsible for sensation and control of the muscles in the back (posterior) side of the thigh, lower leg, and foot. Sciatica is a condition that is characterized by inflammation of this nerve.  SYMPTOMS   Signs of nerve damage, including numbness and/or weakness along the posterior side of the lower extremity.  Pain in the back of the thigh that may also travel down the leg.  Pain that worsens when sitting for long periods of time.  Occasionally, pain in the back or buttock. CAUSES  Inflammation of the sciatic nerve is the cause of sciatica. The inflammation is due to something irritating the nerve. Common sources of irritation include:  Sitting for long periods of time.  Direct trauma to the nerve.  Arthritis of the spine.  Herniated or ruptured disk.  Slipping of the vertebrae (spondylolisthesis).  Pressure from soft tissues, such as muscles or ligament-like tissue (fascia). RISK INCREASES WITH:  Sports that place pressure or stress on the spine (football or weightlifting).  Poor strength and flexibility.  Failure to warm up properly before activity.  Family history of low back pain or disk disorders.  Previous back injury or surgery.  Poor body mechanics, especially when lifting, or poor posture. PREVENTION   Warm up and stretch properly before activity.  Maintain physical fitness:  Strength, flexibility, and endurance.  Cardiovascular fitness.  Learn and use proper technique, especially with posture and lifting. When possible, have coach correct improper technique.  Avoid activities that place stress on the spine. PROGNOSIS If treated properly, then sciatica usually resolves within 6 weeks. However, occasionally surgery is necessary.  RELATED COMPLICATIONS   Permanent nerve damage, including pain, numbness, tingle, or weakness.  Chronic back pain.  Risks of surgery: infection,  bleeding, nerve damage, or damage to surrounding tissues. TREATMENT Treatment initially involves resting from any activities that aggravate your symptoms. The use of ice and medication may help reduce pain and inflammation. The use of strengthening and stretching exercises may help reduce pain with activity. These exercises may be performed at home or with referral to a therapist. A therapist may recommend further treatments, such as transcutaneous electronic nerve stimulation (TENS) or ultrasound. Your caregiver may recommend corticosteroid injections to help reduce inflammation of the sciatic nerve. If symptoms persist despite non-surgical (conservative) treatment, then surgery may be recommended. MEDICATION  If pain medication is necessary, then nonsteroidal anti-inflammatory medications, such as aspirin and ibuprofen, or other minor pain relievers, such as acetaminophen, are often recommended.  Do not take pain medication for 7 days before surgery.  Prescription pain relievers may be given if deemed necessary by your caregiver. Use only as directed and only as much as you need.  Ointments applied to the skin may be helpful.  Corticosteroid injections may be given by your caregiver. These injections should be reserved for the most serious cases, because they may only be given a certain number of times. HEAT AND COLD  Cold treatment (icing) relieves pain and reduces inflammation. Cold treatment should be applied for 10 to 15 minutes every 2 to 3 hours for inflammation and pain and immediately after any activity that aggravates your symptoms. Use ice packs or massage the area with a piece of ice (ice massage).  Heat treatment may be used prior to performing the stretching and strengthening activities prescribed by your caregiver, physical therapist, or athletic trainer. Use a heat pack or soak the injury in warm water.   SEEK MEDICAL CARE IF:  Treatment seems to offer no benefit, or the condition  worsens.  Any medications produce adverse side effects. EXERCISES  RANGE OF MOTION (ROM) AND STRETCHING EXERCISES - Sciatica Most people with sciatic will find that their symptoms worsen with either excessive bending forward (flexion) or arching at the low back (extension). The exercises which will help resolve your symptoms will focus on the opposite motion. Your physician, physical therapist or athletic trainer will help you determine which exercises will be most helpful to resolve your low back pain. Do not complete any exercises without first consulting with your clinician. Discontinue any exercises which worsen your symptoms until you speak to your clinician. If you have pain, numbness or tingling which travels down into your buttocks, leg or foot, the goal of the therapy is for these symptoms to move closer to your back and eventually resolve. Occasionally, these leg symptoms will get better, but your low back pain may worsen; this is typically an indication of progress in your rehabilitation. Be certain to be very alert to any changes in your symptoms and the activities in which you participated in the 24 hours prior to the change. Sharing this information with your clinician will allow him/her to most efficiently treat your condition. These exercises may help you when beginning to rehabilitate your injury. Your symptoms may resolve with or without further involvement from your physician, physical therapist or athletic trainer. While completing these exercises, remember:   Restoring tissue flexibility helps normal motion to return to the joints. This allows healthier, less painful movement and activity.  An effective stretch should be held for at least 30 seconds.  A stretch should never be painful. You should only feel a gentle lengthening or release in the stretched tissue. FLEXION RANGE OF MOTION AND STRETCHING EXERCISES: STRETCH - Flexion, Single Knee to Chest   Lie on a firm bed or floor  with both legs extended in front of you.  Keeping one leg in contact with the floor, bring your opposite knee to your chest. Hold your leg in place by either grabbing behind your thigh or at your knee.  Pull until you feel a gentle stretch in your low back. Hold __________ seconds.  Slowly release your grasp and repeat the exercise with the opposite side. Repeat __________ times. Complete this exercise __________ times per day.  STRETCH - Flexion, Double Knee to Chest  Lie on a firm bed or floor with both legs extended in front of you.  Keeping one leg in contact with the floor, bring your opposite knee to your chest.  Tense your stomach muscles to support your back and then lift your other knee to your chest. Hold your legs in place by either grabbing behind your thighs or at your knees.  Pull both knees toward your chest until you feel a gentle stretch in your low back. Hold __________ seconds.  Tense your stomach muscles and slowly return one leg at a time to the floor. Repeat __________ times. Complete this exercise __________ times per day.  STRETCH - Low Trunk Rotation   Lie on a firm bed or floor. Keeping your legs in front of you, bend your knees so they are both pointed toward the ceiling and your feet are flat on the floor.  Extend your arms out to the side. This will stabilize your upper body by keeping your shoulders in contact with the floor.  Gently and slowly drop both knees together to one side until   you feel a gentle stretch in your low back. Hold for __________ seconds.  Tense your stomach muscles to support your low back as you bring your knees back to the starting position. Repeat the exercise to the other side. Repeat __________ times. Complete this exercise __________ times per day  EXTENSION RANGE OF MOTION AND FLEXIBILITY EXERCISES: STRETCH - Extension, Prone on Elbows  Lie on your stomach on the floor, a bed will be too soft. Place your palms about shoulder  width apart and at the height of your head.  Place your elbows under your shoulders. If this is too painful, stack pillows under your chest.  Allow your body to relax so that your hips drop lower and make contact more completely with the floor.  Hold this position for __________ seconds.  Slowly return to lying flat on the floor. Repeat __________ times. Complete this exercise __________ times per day.  RANGE OF MOTION - Extension, Prone Press Ups  Lie on your stomach on the floor, a bed will be too soft. Place your palms about shoulder width apart and at the height of your head.  Keeping your back as relaxed as possible, slowly straighten your elbows while keeping your hips on the floor. You may adjust the placement of your hands to maximize your comfort. As you gain motion, your hands will come more underneath your shoulders.  Hold this position __________ seconds.  Slowly return to lying flat on the floor. Repeat __________ times. Complete this exercise __________ times per day.  STRENGTHENING EXERCISES - Sciatica  These exercises may help you when beginning to rehabilitate your injury. These exercises should be done near your "sweet spot." This is the neutral, low-back arch, somewhere between fully rounded and fully arched, that is your least painful position. When performed in this safe range of motion, these exercises can be used for people who have either a flexion or extension based injury. These exercises may resolve your symptoms with or without further involvement from your physician, physical therapist or athletic trainer. While completing these exercises, remember:   Muscles can gain both the endurance and the strength needed for everyday activities through controlled exercises.  Complete these exercises as instructed by your physician, physical therapist or athletic trainer. Progress with the resistance and repetition exercises only as your caregiver advises.  You may  experience muscle soreness or fatigue, but the pain or discomfort you are trying to eliminate should never worsen during these exercises. If this pain does worsen, stop and make certain you are following the directions exactly. If the pain is still present after adjustments, discontinue the exercise until you can discuss the trouble with your clinician. STRENGTHENING - Deep Abdominals, Pelvic Tilt   Lie on a firm bed or floor. Keeping your legs in front of you, bend your knees so they are both pointed toward the ceiling and your feet are flat on the floor.  Tense your lower abdominal muscles to press your low back into the floor. This motion will rotate your pelvis so that your tail bone is scooping upwards rather than pointing at your feet or into the floor.  With a gentle tension and even breathing, hold this position for __________ seconds. Repeat __________ times. Complete this exercise __________ times per day.  STRENGTHENING - Abdominals, Crunches   Lie on a firm bed or floor. Keeping your legs in front of you, bend your knees so they are both pointed toward the ceiling and your feet are flat on the   floor. Cross your arms over your chest.  Slightly tip your chin down without bending your neck.  Tense your abdominals and slowly lift your trunk high enough to just clear your shoulder blades. Lifting higher can put excessive stress on the low back and does not further strengthen your abdominal muscles.  Control your return to the starting position. Repeat __________ times. Complete this exercise __________ times per day.  STRENGTHENING - Quadruped, Opposite UE/LE Lift  Assume a hands and knees position on a firm surface. Keep your hands under your shoulders and your knees under your hips. You may place padding under your knees for comfort.  Find your neutral spine and gently tense your abdominal muscles so that you can maintain this position. Your shoulders and hips should form a rectangle  that is parallel with the floor and is not twisted.  Keeping your trunk steady, lift your right hand no higher than your shoulder and then your left leg no higher than your hip. Make sure you are not holding your breath. Hold this position __________ seconds.  Continuing to keep your abdominal muscles tense and your back steady, slowly return to your starting position. Repeat with the opposite arm and leg. Repeat __________ times. Complete this exercise __________ times per day.  STRENGTHENING - Abdominals and Quadriceps, Straight Leg Raise   Lie on a firm bed or floor with both legs extended in front of you.  Keeping one leg in contact with the floor, bend the other knee so that your foot can rest flat on the floor.  Find your neutral spine, and tense your abdominal muscles to maintain your spinal position throughout the exercise.  Slowly lift your straight leg off the floor about 6 inches for a count of 15, making sure to not hold your breath.  Still keeping your neutral spine, slowly lower your leg all the way to the floor. Repeat this exercise with each leg __________ times. Complete this exercise __________ times per day. POSTURE AND BODY MECHANICS CONSIDERATIONS - Sciatica Keeping correct posture when sitting, standing or completing your activities will reduce the stress put on different body tissues, allowing injured tissues a chance to heal and limiting painful experiences. The following are general guidelines for improved posture. Your physician or physical therapist will provide you with any instructions specific to your needs. While reading these guidelines, remember:  The exercises prescribed by your provider will help you have the flexibility and strength to maintain correct postures.  The correct posture provides the optimal environment for your joints to work. All of your joints have less wear and tear when properly supported by a spine with good posture. This means you will  experience a healthier, less painful body.  Correct posture must be practiced with all of your activities, especially prolonged sitting and standing. Correct posture is as important when doing repetitive low-stress activities (typing) as it is when doing a single heavy-load activity (lifting). RESTING POSITIONS Consider which positions are most painful for you when choosing a resting position. If you have pain with flexion-based activities (sitting, bending, stooping, squatting), choose a position that allows you to rest in a less flexed posture. You would want to avoid curling into a fetal position on your side. If your pain worsens with extension-based activities (prolonged standing, working overhead), avoid resting in an extended position such as sleeping on your stomach. Most people will find more comfort when they rest with their spine in a more neutral position, neither too rounded nor too   arched. Lying on a non-sagging bed on your side with a pillow between your knees, or on your back with a pillow under your knees will often provide some relief. Keep in mind, being in any one position for a prolonged period of time, no matter how correct your posture, can still lead to stiffness. PROPER SITTING POSTURE In order to minimize stress and discomfort on your spine, you must sit with correct posture Sitting with good posture should be effortless for a healthy body. Returning to good posture is a gradual process. Many people can work toward this most comfortably by using various supports until they have the flexibility and strength to maintain this posture on their own. When sitting with proper posture, your ears will fall over your shoulders and your shoulders will fall over your hips. You should use the back of the chair to support your upper back. Your low back will be in a neutral position, just slightly arched. You may place a small pillow or folded towel at the base of your low back for support.  When  working at a desk, create an environment that supports good, upright posture. Without extra support, muscles fatigue and lead to excessive strain on joints and other tissues. Keep these recommendations in mind: CHAIR:   A chair should be able to slide under your desk when your back makes contact with the back of the chair. This allows you to work closely.  The chair's height should allow your eyes to be level with the upper part of your monitor and your hands to be slightly lower than your elbows. BODY POSITION  Your feet should make contact with the floor. If this is not possible, use a foot rest.  Keep your ears over your shoulders. This will reduce stress on your neck and low back. INCORRECT SITTING POSTURES   If you are feeling tired and unable to assume a healthy sitting posture, do not slouch or slump. This puts excessive strain on your back tissues, causing more damage and pain. Healthier options include:  Using more support, like a lumbar pillow.  Switching tasks to something that requires you to be upright or walking.  Talking a brief walk.  Lying down to rest in a neutral-spine position. PROLONGED STANDING WHILE SLIGHTLY LEANING FORWARD  When completing a task that requires you to lean forward while standing in one place for a long time, place either foot up on a stationary 2-4 inch high object to help maintain the best posture. When both feet are on the ground, the low back tends to lose its slight inward curve. If this curve flattens (or becomes too large), then the back and your other joints will experience too much stress, fatigue more quickly and can cause pain.  CORRECT STANDING POSTURES Proper standing posture should be assumed with all daily activities, even if they only take a few moments, like when brushing your teeth. As in sitting, your ears should fall over your shoulders and your shoulders should fall over your hips. You should keep a slight tension in your abdominal  muscles to brace your spine. Your tailbone should point down to the ground, not behind your body, resulting in an over-extended swayback posture.  INCORRECT STANDING POSTURES  Common incorrect standing postures include a forward head, locked knees and/or an excessive swayback. WALKING Walk with an upright posture. Your ears, shoulders and hips should all line-up. PROLONGED ACTIVITY IN A FLEXED POSITION When completing a task that requires you to bend forward   at your waist or lean over a low surface, try to find a way to stabilize 3 of 4 of your limbs. You can place a hand or elbow on your thigh or rest a knee on the surface you are reaching across. This will provide you more stability so that your muscles do not fatigue as quickly. By keeping your knees relaxed, or slightly bent, you will also reduce stress across your low back. CORRECT LIFTING TECHNIQUES DO :   Assume a wide stance. This will provide you more stability and the opportunity to get as close as possible to the object which you are lifting.  Tense your abdominals to brace your spine; then bend at the knees and hips. Keeping your back locked in a neutral-spine position, lift using your leg muscles. Lift with your legs, keeping your back straight.  Test the weight of unknown objects before attempting to lift them.  Try to keep your elbows locked down at your sides in order get the best strength from your shoulders when carrying an object.  Always ask for help when lifting heavy or awkward objects. INCORRECT LIFTING TECHNIQUES DO NOT:   Lock your knees when lifting, even if it is a small object.  Bend and twist. Pivot at your feet or move your feet when needing to change directions.  Assume that you cannot safely pick up a paperclip without proper posture.   This information is not intended to replace advice given to you by your health care provider. Make sure you discuss any questions you have with your health care provider.     Document Released: 11/22/2005 Document Revised: 04/08/2015 Document Reviewed: 03/06/2009 [employer] Interactive Patient Education 2016 [employer]  

## 2016-09-16 ENCOUNTER — Telehealth: Payer: Self-pay | Admitting: Pediatrics

## 2016-09-16 DIAGNOSIS — M545 Low back pain: Secondary | ICD-10-CM

## 2016-09-16 NOTE — Telephone Encounter (Signed)
Patient has been following all instructions given to her at office visit.  Her pain in hip and leg still bothers her. She has been out of work this week.    Could you please do a referral and send her to physical therapy to hopefully improve her condition?   She would prefer to go to the office next door.

## 2016-09-16 NOTE — Telephone Encounter (Signed)
I put in referral for PT.  Like we discussed in office visit, it usually takes time for back pain to improve, sometimes up to 4-6 weeks. Resting, NSAIDs, the gentle back exercises that we discussed can help.

## 2016-09-16 NOTE — Telephone Encounter (Signed)
Aware. 

## 2016-09-20 ENCOUNTER — Encounter: Payer: Self-pay | Admitting: Pediatrics

## 2016-09-20 ENCOUNTER — Ambulatory Visit: Payer: 59 | Attending: Pediatrics | Admitting: Physical Therapy

## 2016-09-20 ENCOUNTER — Telehealth: Payer: Self-pay | Admitting: Pediatrics

## 2016-09-20 ENCOUNTER — Ambulatory Visit (INDEPENDENT_AMBULATORY_CARE_PROVIDER_SITE_OTHER): Payer: 59

## 2016-09-20 DIAGNOSIS — M545 Low back pain: Secondary | ICD-10-CM | POA: Diagnosis not present

## 2016-09-20 DIAGNOSIS — Z23 Encounter for immunization: Secondary | ICD-10-CM | POA: Diagnosis not present

## 2016-09-20 NOTE — Telephone Encounter (Signed)
Patient aware that letter is ready for pick up.

## 2016-09-20 NOTE — Telephone Encounter (Signed)
I printed note, may come pick it up. She should avoid heavy lifting, pushing or anything that makes symptoms worse.

## 2016-09-20 NOTE — Therapy (Signed)
[facility] [place], [place], [address] Phone: [phone]   Fax:  [phone]  Physical Therapy Evaluation  Patient Details  Name: Tanya Wilkinson MRN: [id number] Date of Birth: Jun 04, 1961 Referring Provider: Assunta Found MD.  Encounter Date: 09/20/2016      PT End of Session - 09/20/16 1245    Visit Number 1   Number of Visits 8   Date for PT Re-Evaluation 10/18/16   PT Start Time 1115   PT Stop Time 1154   PT Time Calculation (min) 39 min   Activity Tolerance Patient tolerated treatment well   Behavior During Therapy [facility] for tasks assessed/performed      Past Medical History:  Diagnosis Date  . Herniated disc   . Hyperlipidemia   . Vitamin D deficiency     Past Surgical History:  Procedure Laterality Date  . ANTERIOR CERVICAL DECOMP/DISCECTOMY FUSION  08/2006  . CESAREAN SECTION  [id number]  . COLONOSCOPY  [facility] in [place]   . TUBAL LIGATION  [id number]   BTL    There were no vitals filed for this visit.       Subjective Assessment - 09/20/16 1238    Subjective the patient reports lifting her grandson and twisting on 09/10/16.  She felt pain radiate into her left buttock, posterior thigh and calf of leg.  Initially she states it even felt as if her left ankle was weaker.  A regimen of Prednisone has been helpful. anf the patient states she is feeling better.  Her job reqiuires she sits a lot but she can get up and move around.  She uses a pillow behing her back and has elevated her computer keyboard.  her pain is rated at a 6/10 today.  Heat, ice and stretching decrease her pain.  Her pain is worst when she first rises in the morning.  The patient has had back pain in the past.   Limitations Sitting   How long can you sit comfortably? 15 minutes.   Patient Stated Goals Get out of pain and be able to work like I did before this incident of back pain.   Currently in Pain? Yes   Pain Score 6    Pain  Location Leg   Pain Orientation Left   Pain Descriptors / Indicators Aching   Pain Type Acute pain   Pain Onset 1 to 4 weeks ago   Pain Frequency Constant   Aggravating Factors  First getting up in the morning.   Pain Relieving Factors Please se above.            [facility] PT Assessment - 09/20/16 0001      Assessment   Medical Diagnosis Low back pain    Referring Provider Assunta Found MD.   Onset Date/Surgical Date --  09/10/16.     Precautions   Precautions --  Osteopenia.     Restrictions   Weight Bearing Restrictions No     Balance Screen   Has the patient fallen in the past 6 months No   Has the patient had a decrease in activity level because of a fear of falling?  No   Is the patient reluctant to leave their home because of a fear of falling?  No     Home Ecologist residence     Prior Function   Level of Independence Independent     Posture/Postural Control   Posture/Postural Control No significant limitations  ROM / Strength   AROM / PROM / Strength AROM;Strength     AROM   Overall AROM Comments Normal active lumbar AROM with pain at endrange extension.  Hamstring 45 degrees bilaterally when assessed via a SLR.     Strength   Overall Strength Comments Normal bilateral LE strength.     Palpation   Palpation comment No c/o palpable pain.  CC is referred pain into left posterior thigh and occasionally to left calf.     Special Tests    Special Tests --  Norm LE DTR's;(=) leg lengths;(-) SLR and FABER testing.     Ambulation/Gait   Gait Comments WNL.                   [facility] Adult PT Treatment/Exercise - 09/20/16 0001      Exercises   Exercises Knee/Hip     Knee/Hip Exercises: Aerobic   Nustep Level 5 x 8 minutes.                     PT Long Term Goals - 09/20/16 1248      PT LONG TERM GOAL #1   Title Independent with a HEP.   Time 4   Period Weeks   Status New                Plan - 09/20/16 1245    Clinical Impression Statement The patient CC is that of left posterior thigh pain that can go into the left calf.  Her strength is normal as well as her lumbar ROM.  LE DTR's are intact.  She has tightness in bilateral hamstrings.  She would like to have one f/u session for the establishment of a HEP.   Rehab Potential Excellent   PT Frequency 2x / week   PT Duration 4 weeks   PT Treatment/Interventions ADLs/Self Care Home Management;Therapeutic activities;Therapeutic exercise   PT Next Visit Plan Core exercises.  Supine hamstring stretching bilaterally.      Patient will benefit from skilled therapeutic intervention in order to improve the following deficits and impairments:  Decreased activity tolerance, Pain  Visit Diagnosis: Acute left-sided low back pain, with sciatica presence unspecified - Plan: PT plan of care cert/re-cert     Problem List Patient Active Problem List   Diagnosis Date Noted  . BMI 28.0-28.9,adult 06/29/2016  . Osteopenia 09/04/2014  . Low serum vitamin D 09/04/2014  . Hyperlipidemia with target LDL less than 100 12/26/2013  . Metabolic syndrome [id number]    Vencil Basnett, [place] MPT 09/20/2016, 12:57 PM  [facility] [address] [place], [place], [address] Phone: [phone]   Fax:  [phone]  Name: Tanya Wilkinson MRN: [id number] Date of Birth: 19-Feb-1961

## 2016-09-21 ENCOUNTER — Encounter: Payer: 59 | Admitting: Physical Therapy

## 2016-09-22 ENCOUNTER — Ambulatory Visit: Payer: 59 | Admitting: Physical Therapy

## 2016-09-22 ENCOUNTER — Encounter: Payer: Self-pay | Admitting: Physical Therapy

## 2016-09-22 DIAGNOSIS — M545 Low back pain: Secondary | ICD-10-CM

## 2016-09-22 MED FILL — CYCLOBENZAPRINE 10 MG TAB: 10 | 10 days supply | Qty: 30 | Fill #0

## 2016-09-22 NOTE — Therapy (Addendum)
[facility] [place], [place], [address] Phone: [phone]   Fax:  [phone]  Physical Therapy Treatment  Patient Details  Name: Tanya Wilkinson MRN: [id number] Date of Birth: 10/20/1961 Referring Provider: Assunta Found MD.  Encounter Date: 09/22/2016      PT End of Session - 09/22/16 0814    Visit Number 2   Number of Visits 8   Date for PT Re-Evaluation 10/18/16   PT Start Time 0817   PT Stop Time 0913   PT Time Calculation (min) 56 min   Activity Tolerance Patient tolerated treatment well   Behavior During Therapy [facility] for tasks assessed/performed      Past Medical History:  Diagnosis Date  . Herniated disc   . Hyperlipidemia   . Vitamin D deficiency     Past Surgical History:  Procedure Laterality Date  . ANTERIOR CERVICAL DECOMP/DISCECTOMY FUSION  08/2006  . CESAREAN SECTION  [id number]  . COLONOSCOPY  [facility] in [place]   . TUBAL LIGATION  [id number]   BTL    There were no vitals filed for this visit.      Subjective Assessment - 09/22/16 0814    Subjective Reports that she has some pain in L thigh region today. Reports that she had a hand on a towel rack to stretch her back and that helped. Reports that she returned to work yesterday and had some discomfort but able to move and stretch during the shift. Reports that she wrapped her L thigh in ace bandage and that helped. Reports that sometimes she feels like she may have been slapped or stinging pain in L thigh.   Limitations Sitting   How long can you sit comfortably? 15 minutes.   Patient Stated Goals Get out of pain and be able to work like I did before this incident of back pain.   Currently in Pain? Yes   Pain Score 6    Pain Location Leg   Pain Orientation Left;Upper   Pain Descriptors / Indicators Burning;Aching   Pain Type Acute pain   Pain Onset 1 to 4 weeks ago            [facility] PT Assessment - 09/22/16 0001       Assessment   Medical Diagnosis Low back pain    Onset Date/Surgical Date 09/10/16   Next MD Visit None scheduled     Restrictions   Weight Bearing Restrictions No                     [facility] Adult PT Treatment/Exercise - 09/22/16 0001      Exercises   Exercises Lumbar;Knee/Hip     Lumbar Exercises: Supine   Ab Set 20 reps;5 seconds   Clam 20 reps     Lumbar Exercises: Sidelying   Other Sidelying Lumbar Exercises R sidelying positional traction x5 min     Knee/Hip Exercises: Stretches   Active Hamstring Stretch Both;3 reps;30 seconds   Piriformis Stretch Both;3 reps;30 seconds     Knee/Hip Exercises: Aerobic   Nustep L5 x10 min  no pain reported     Modalities   Modalities Electrical Stimulation;Moist Heat     Moist Heat Therapy   Number Minutes Moist Heat 15 Minutes   Moist Heat Location Lumbar Spine     Electrical Stimulation   Electrical Stimulation Location L low back/ buttock   Electrical Stimulation Action IFC   Electrical Stimulation Parameters 1-10 hz x15  min   Electrical Stimulation Goals Pain                PT Education - 09/22/16 0908    Education provided Yes   Education Details HEP- HS stretch, Piriformis stretch, positional traction, ab set, marching   Person(s) Educated Patient   Methods Explanation;Demonstration;Verbal cues;Handout   Comprehension Verbalized understanding;Returned demonstration;Verbal cues required             PT Long Term Goals - 09/20/16 1248      PT LONG TERM GOAL #1   Title Independent with a HEP.   Time 4   Period Weeks   Status New               Plan - 09/22/16 0915    Clinical Impression Statement Patient arrived today with pain predominately in L thigh region today. Patient did not experience pain with NuStep today and had no reports of increased pain with supine exercises. Patient compliant with core activation following initial education and able to complete core activation well  with supine exercises. MPT educated patient in regards to positiional traction at home and the technique with patient noting that the position felt "good." Normal modalities response noted following removal of the modalities. Patient experienced low back feeling "good" following removal of the modalities. Patient accepted new stretching and strengthening HEP without questions following education of technique and parameters.   Rehab Potential Excellent   PT Frequency 2x / week   PT Duration 4 weeks   PT Treatment/Interventions ADLs/Self Care Home Management;Therapeutic activities;Therapeutic exercise;Electrical Stimulation;Moist Heat  Electrical stimuation and moist heat added to POC upon MPT approval   PT Next Visit Plan Core exercises.  Supine hamstring stretching bilaterally.   PT Home Exercise Plan HEP- HS, Piriformis stretch, ab set, marching, positional traction   Consulted and Agree with Plan of Care Patient      Patient will benefit from skilled therapeutic intervention in order to improve the following deficits and impairments:  Decreased activity tolerance, Pain  Visit Diagnosis: Acute left-sided low back pain, with sciatica presence unspecified     Problem List Patient Active Problem List   Diagnosis Date Noted  . BMI 28.0-28.9,adult 06/29/2016  . Osteopenia 09/04/2014  . Low serum vitamin D 09/04/2014  . Hyperlipidemia with target LDL less than 100 12/26/2013  . Metabolic syndrome 30/16/0109    Wynelle Fanny, PTA 09/22/2016, 9:47 AM  [facility] [address] [place], [place], [address] Phone: [phone]   Fax:  [phone]  Name: Tanya Wilkinson MRN: [id number] Date of Birth: May 13, 1961   PHYSICAL THERAPY DISCHARGE SUMMARY  Visits from Start of Care: 2.  Current functional level related to goals / functional outcomes: See above.   Remaining deficits: Pt did not return.   Education /  Equipment:  Plan: Patient agrees to discharge.  Patient goals were not met. Patient is being discharged due to not returning since the last visit.  ?????         Mali Applegate MPT

## 2016-09-22 NOTE — Patient Instructions (Addendum)
Knee-to-Chest Stretch: Unilateral    With hand behind right/left knee, pull knee in to chest until a comfortable stretch is felt in lower back and buttocks. Keep back relaxed. Hold __30__ seconds. Repeat _3___ times per set. Do ____ sets per session. Do __2-3__ sessions per day.  http://orth.exer.us/126   Copyright  VHI. All rights reserved.  Isometric Abdominal    Lying on back with knees bent, tighten stomach by pressing belly button to spine. Hold __5__ seconds. Repeat _10___ times per set. Do __2__ sets per session. Do __2-3__ sessions per day.  http://orth.exer.us/1086   Copyright  VHI. All rights reserved.  Unilateral Isometric Hip Flexion    Tighten stomach and raise right and left knee one at a time. Keep trunk rigid.  Repeat _10___ times per set. Do _2___ sets per session. Do __2-3__ sessions per day.  http://orth.exer.us/1098   Copyright  VHI. All rights reserved.  Thoracolumbar Side-Bend: Caudal - Bolster (Side-Lying)    Lie on right side, bolster at hip crest level. Hold 5-10 minutes. Relax with shoulders turned upward and knees bent. ---Pillow rolled up tight with towel rolled around tightly. Repeat ____ times per set. Do ____ sets per session. Do __2__ sessions per day.  http://orth.exer.us/1008   Copyright  VHI. All rights reserved.  Piriformis (Supine)    Cross legs, left on top. Gently pull other knee toward chest until stretch is felt in buttock/hip of top leg. Hold __30__ seconds. Repeat __3__ times per set. Do ____ sets per session. Do __2-3__ sessions per day.  http://orth.exer.us/676   Copyright  VHI. All rights reserved.

## 2016-09-27 MED FILL — MELOXICAM 15 MG TABLET: 15 | 30 days supply | Qty: 30 | Fill #1

## 2016-09-28 DIAGNOSIS — Z0289 Encounter for other administrative examinations: Secondary | ICD-10-CM

## 2016-10-01 ENCOUNTER — Telehealth: Payer: Self-pay | Admitting: Nurse Practitioner

## 2016-10-01 NOTE — Telephone Encounter (Signed)
ntbs for follow up

## 2016-10-01 NOTE — Telephone Encounter (Signed)
Please advise and route to Pool B 

## 2016-10-05 ENCOUNTER — Ambulatory Visit (INDEPENDENT_AMBULATORY_CARE_PROVIDER_SITE_OTHER): Payer: 59 | Admitting: Nurse Practitioner

## 2016-10-05 ENCOUNTER — Encounter: Payer: Self-pay | Admitting: Nurse Practitioner

## 2016-10-05 VITALS — BP 138/78 | HR 86 | Temp 97.6°F | Ht 62.0 in | Wt 160.0 lb

## 2016-10-05 DIAGNOSIS — E8881 Metabolic syndrome: Secondary | ICD-10-CM | POA: Diagnosis not present

## 2016-10-05 DIAGNOSIS — B351 Tinea unguium: Secondary | ICD-10-CM | POA: Diagnosis not present

## 2016-10-05 DIAGNOSIS — M5440 Lumbago with sciatica, unspecified side: Secondary | ICD-10-CM | POA: Diagnosis not present

## 2016-10-05 DIAGNOSIS — M8580 Other specified disorders of bone density and structure, unspecified site: Secondary | ICD-10-CM | POA: Diagnosis not present

## 2016-10-05 DIAGNOSIS — Z6828 Body mass index (BMI) 28.0-28.9, adult: Secondary | ICD-10-CM

## 2016-10-05 DIAGNOSIS — R7989 Other specified abnormal findings of blood chemistry: Secondary | ICD-10-CM

## 2016-10-05 DIAGNOSIS — E785 Hyperlipidemia, unspecified: Secondary | ICD-10-CM | POA: Diagnosis not present

## 2016-10-05 DIAGNOSIS — Z1212 Encounter for screening for malignant neoplasm of rectum: Secondary | ICD-10-CM | POA: Diagnosis not present

## 2016-10-05 MED ORDER — TERBINAFINE HCL 250 MG PO TABS: 250.0000 mg | ORAL_TABLET | Freq: Every day | ORAL | 0 refills | Status: DC

## 2016-10-05 MED ORDER — ATORVASTATIN CALCIUM 40 MG PO TABS: 40.0000 mg | ORAL_TABLET | Freq: Every day | ORAL | 1 refills | Status: DC

## 2016-10-05 MED ORDER — MELOXICAM 15 MG PO TABS: 15.0000 mg | ORAL_TABLET | Freq: Every day | ORAL | 3 refills | Status: DC

## 2016-10-05 MED FILL — ATORVASTATIN 40 MG TABLET: 40 | 90 days supply | Qty: 90 | Fill #0

## 2016-10-05 MED FILL — TERBINAFINE HCL 250 MG TAB: 250 | 90 days supply | Qty: 90 | Fill #0

## 2016-10-05 NOTE — Patient Instructions (Signed)

## 2016-10-05 NOTE — Progress Notes (Signed)
Subjective:    Patient ID: Tanya Wilkinson, female    DOB: 1960/12/16, 55 y.o.   MRN: [id number]  Patient here today for follow up of chronic medical problems.   Current Outpatient Prescriptions on File Prior to Visit  Medication Sig Dispense Refill  . atorvastatin (LIPITOR) 40 MG tablet Take 1 tablet (40 mg total) by mouth daily. 90 tablet 1  . Calcium Carb-Cholecalciferol (CALCIUM + D3) 600-200 MG-UNIT TABS Take 1 tablet by mouth daily.    . cyclobenzaprine (FLEXERIL) 10 MG tablet Take 1 tablet (10 mg total) by mouth 3 (three) times daily as needed for muscle spasms. 30 tablet 1  . meloxicam (MOBIC) 15 MG tablet Take 1 tablet (15 mg total) by mouth daily. 30 tablet 3  . Multiple Vitamins-Minerals (CENTRUM SILVER PO) Take 1 tablet by mouth daily.    Marland Kitchen terbinafine (LAMISIL) 250 MG tablet Take 1 tablet (250 mg total) by mouth daily. 90 tablet 0   No current facility-administered medications on file prior to visit.      * She reports that her back and leg pain has flared up in the last 3 weeks after she injured her back picking up her grandchild. She was out of work for about a weeks after the injury.The pain is mostly in her left sacro-iliac area, it radiates down to her lower leg, she has burning and tingling down her left leg. She has tried physical therapy,ice packs heating pads and biofreeze. She is requesting for an MRI today.   Back Pain  This is a recurrent problem. The current episode started more than 1 year ago. The problem occurs intermittently. The problem has been gradually worsening since onset. The pain is present in the lumbar spine. The quality of the pain is described as aching and stabbing. The pain radiates to the right thigh. The pain is at a severity of 8/10. The pain is moderate. The pain is worse during the day. The symptoms are aggravated by sitting. Associated symptoms include leg pain. Pertinent negatives include no chest pain, paresis, paresthesias or tingling. She  has tried NSAIDs for the symptoms. The treatment provided mild relief.  Hyperlipidemia  This is a chronic problem. The current episode started more than 1 year ago. The problem is controlled. She has no history of chronic renal disease, diabetes, hypothyroidism or liver disease. Associated symptoms include leg pain. Pertinent negatives include no chest pain, focal sensory loss, focal weakness or shortness of breath. Current antihyperlipidemic treatment includes statins. The current treatment provides moderate improvement of lipids. There are no compliance problems.  Risk factors for coronary artery disease include dyslipidemia.  metabolic syndrome Watches diet. Does not check blood sugar at home. Vitamin d def patinet is currently not taking anything now.  Review of Systems  Constitutional: Negative.   HENT: Negative.   Eyes: Negative.   Respiratory: Negative.  Negative for shortness of breath.   Cardiovascular: Negative.  Negative for chest pain.  Gastrointestinal: Negative for constipation and diarrhea.  Endocrine: Negative.   Genitourinary: Negative for difficulty urinating and urgency.       Positive for urinary hesitancy. Urine stream stops and starts.  Musculoskeletal: Positive for arthralgias (left leg). Negative for back pain and joint swelling.  Skin: Negative.   Allergic/Immunologic: Negative.   Neurological: Negative.  Negative for tingling, focal weakness and paresthesias.  Hematological: Negative.   Psychiatric/Behavioral: Negative.        Objective:   Physical Exam  Constitutional: She is oriented to person,  place, and time. She appears well-developed and well-nourished.  HENT:  Head: Normocephalic.  Nose: Nose normal.  Mouth/Throat: Oropharynx is clear and moist.  Eyes: EOM are normal. Pupils are equal, round, and reactive to light.  Neck: Trachea normal, normal range of motion and full passive range of motion without pain. Neck supple. No JVD present. Carotid bruit  is not present. No thyromegaly present.  Cardiovascular: Normal rate, regular rhythm, normal heart sounds and intact distal pulses.  Exam reveals no gallop and no friction rub.   No murmur heard. Pulmonary/Chest: Effort normal and breath sounds normal.  Abdominal: Soft. Bowel sounds are normal. She exhibits no distension and no mass. There is no tenderness.  Musculoskeletal: Normal range of motion. She exhibits no edema or deformity.  FROM of lumbar spine, with pain on flexion and extension of the lumber spine  Positive straight leg raise test on the left Motor strength and sensation in legs intact bilaterally   Lymphadenopathy:    She has no cervical adenopathy.  Neurological: She is alert and oriented to person, place, and time. She has normal reflexes.  Skin: Skin is warm and dry.  Psychiatric: She has a normal mood and affect. Her behavior is normal. Judgment and thought content normal.    BP 138/78 (BP Location: Left Arm, Cuff Size: Normal)   Pulse 86   Temp 97.6 F (36.4 C) (Oral)   Ht '5\' 2"'  (1.575 m)   Wt 160 lb (72.6 kg)   LMP 12/07/2007   BMI 29.26 kg/m      Assessment & Plan:  1. Acute left-sided low back pain with sciatica, sciatica laterality unspecified Continue flexeril and Mobic as needed Keep appointment with Neurosurgery - Ambulatory referral to Neurosurgery  2. Hyperlipidemia with target LDL less than 100 Low fat diet - atorvastatin (LIPITOR) 40 MG tablet; Take 1 tablet (40 mg total) by mouth daily.  Dispense: 90 tablet; Refill: 1 - Lipid panel - [id number]   4. Metabolic syndrome Low carb diet  5. Osteopenia, unspecified location   6. Low serum vitamin D    7. BMI 28.0-28.9,adult Weight loss and AHA exercise recommendation discussed    Labs pending Health maintenance reviewed Diet and exercise encouraged Continue all meds Follow up  in 6 months and PRN  Jari Favre, RN, BSN, [place], FNP

## 2016-10-06 LAB — LIPID PANEL
CHOL/HDL RATIO: 4.3 ratio (ref 0.0–4.4)
CHOLESTEROL TOTAL: 217 mg/dL — AB (ref 100–199)
HDL: 51 mg/dL (ref >39)
LDL CALC: 133 mg/dL — AB (ref 0–99)
TRIGLYCERIDES: 166 mg/dL — AB (ref 0–149)
VLDL CHOLESTEROL CAL: 33 mg/dL (ref 5–40)

## 2016-10-06 LAB — CMP14+EGFR
A/G RATIO: 1.8 (ref 1.2–2.2)
ALK PHOS: 103 IU/L (ref 39–117)
ALT: 10 IU/L (ref 0–32)
AST: 13 IU/L (ref 0–40)
Albumin: 4.6 g/dL (ref 3.5–5.5)
BILIRUBIN TOTAL: 0.4 mg/dL (ref 0.0–1.2)
BUN/Creatinine Ratio: 16 (ref 9–23)
BUN: 12 mg/dL (ref 6–24)
CHLORIDE: 99 mmol/L (ref 96–106)
CO2: 28 mmol/L (ref 18–29)
Calcium: 9.8 mg/dL (ref 8.7–10.2)
Creatinine, Ser: 0.76 mg/dL (ref 0.57–1.00)
GFR calc Af Amer: 101 mL/min/{1.73_m2} (ref >59)
GFR, EST NON AFRICAN AMERICAN: 87 mL/min/{1.73_m2} (ref >59)
GLOBULIN, TOTAL: 2.6 g/dL (ref 1.5–4.5)
Glucose: 94 mg/dL (ref 65–99)
POTASSIUM: 4.5 mmol/L (ref 3.5–5.2)
SODIUM: 142 mmol/L (ref 134–144)
Total Protein: 7.2 g/dL (ref 6.0–8.5)

## 2016-10-06 LAB — FECAL OCCULT BLOOD, IMMUNOCHEMICAL: Fecal Occult Bld: NEGATIVE

## 2016-10-09 ENCOUNTER — Other Ambulatory Visit: Payer: Self-pay | Admitting: Nurse Practitioner

## 2016-10-09 DIAGNOSIS — M5441 Lumbago with sciatica, right side: Principal | ICD-10-CM

## 2016-10-09 DIAGNOSIS — G8929 Other chronic pain: Secondary | ICD-10-CM

## 2016-10-09 DIAGNOSIS — M5442 Lumbago with sciatica, left side: Principal | ICD-10-CM

## 2016-10-15 ENCOUNTER — Ambulatory Visit (HOSPITAL_COMMUNITY): Payer: 59

## 2016-10-18 ENCOUNTER — Ambulatory Visit (HOSPITAL_COMMUNITY)
Admission: RE | Admit: 2016-10-18 | Discharge: 2016-10-18 | Disposition: A | Discharge status: Home / Self Care | Payer: 59 | Source: Ambulatory Visit | Attending: Nurse Practitioner | Admitting: Nurse Practitioner

## 2016-10-18 DIAGNOSIS — M5127 Other intervertebral disc displacement, lumbosacral region: Secondary | ICD-10-CM | POA: Diagnosis not present

## 2016-10-18 DIAGNOSIS — M5441 Lumbago with sciatica, right side: Secondary | ICD-10-CM | POA: Diagnosis not present

## 2016-10-18 DIAGNOSIS — M48061 Spinal stenosis, lumbar region without neurogenic claudication: Secondary | ICD-10-CM | POA: Diagnosis not present

## 2016-10-18 DIAGNOSIS — G8929 Other chronic pain: Secondary | ICD-10-CM | POA: Insufficient documentation

## 2016-10-18 DIAGNOSIS — M5442 Lumbago with sciatica, left side: Secondary | ICD-10-CM | POA: Diagnosis not present

## 2016-10-18 MED ORDER — GADOBENATE DIMEGLUMINE 529 MG/ML IV SOLN
15.0000 mL | Freq: Once | INTRAVENOUS | Status: AC | PRN
  Administered 2016-10-18: 15 mL via INTRAVENOUS

## 2016-10-20 NOTE — Progress Notes (Signed)
Patient aware.

## 2016-11-05 DIAGNOSIS — M5417 Radiculopathy, lumbosacral region: Secondary | ICD-10-CM | POA: Diagnosis not present

## 2016-11-05 DIAGNOSIS — M5127 Other intervertebral disc displacement, lumbosacral region: Secondary | ICD-10-CM | POA: Diagnosis not present

## 2016-11-05 DIAGNOSIS — M4807 Spinal stenosis, lumbosacral region: Secondary | ICD-10-CM | POA: Diagnosis not present

## 2016-11-05 DIAGNOSIS — Z6829 Body mass index (BMI) 29.0-29.9, adult: Secondary | ICD-10-CM | POA: Diagnosis not present

## 2016-11-05 DIAGNOSIS — R030 Elevated blood-pressure reading, without diagnosis of hypertension: Secondary | ICD-10-CM | POA: Diagnosis not present

## 2017-04-13 ENCOUNTER — Encounter: Payer: Self-pay | Admitting: Family Medicine

## 2017-04-13 ENCOUNTER — Telehealth: Payer: Self-pay | Admitting: Nurse Practitioner

## 2017-04-13 ENCOUNTER — Ambulatory Visit (INDEPENDENT_AMBULATORY_CARE_PROVIDER_SITE_OTHER): Payer: 59 | Admitting: Family Medicine

## 2017-04-13 VITALS — BP 150/74 | HR 68 | Temp 97.7°F | Ht 62.0 in | Wt 170.2 lb

## 2017-04-13 DIAGNOSIS — E785 Hyperlipidemia, unspecified: Secondary | ICD-10-CM

## 2017-04-13 DIAGNOSIS — L600 Ingrowing nail: Secondary | ICD-10-CM

## 2017-04-13 MED ORDER — SULFAMETHOXAZOLE-TRIMETHOPRIM 800-160 MG PO TABS: 1.0000 | ORAL_TABLET | Freq: Two times a day (BID) | ORAL | 0 refills | Status: DC

## 2017-04-13 MED ORDER — ATORVASTATIN CALCIUM 40 MG PO TABS: 40.0000 mg | ORAL_TABLET | Freq: Every day | ORAL | 1 refills | Status: DC

## 2017-04-13 NOTE — Progress Notes (Signed)
BP (!) 150/74 (BP Location: Left Arm, Patient Position: Sitting, Cuff Size: Normal)   Pulse 68   Temp 97.7 F (36.5 C) (Oral)   Ht 5\' 2"  (1.575 m)   Wt 170 lb 3.2 oz (77.2 kg)   LMP 12/07/2007   BMI 31.13 kg/m    Subjective:    Patient ID: Steve Rattler, female    DOB: 08/23/1961, 56 y.o.   MRN: [id number]  HPI: TAMMALA WEIDER is a 56 y.o. female presenting on 04/13/2017 for Ingrown Toenail (R great toe, redness x 2 days)   HPI Ingrown toenail Patient is coming in today with complaints of an ingrown toenail on her right great toe on the medial aspect of it. She denies any fevers or chills but does have a lot of redness and warmth and swelling and pain on the medial paronychia of that toe. She's also been having some drainage that is purulent in nature over the past day. The redness is been going on for 2 days. She has had these previously but never as bad as this time.  Relevant past medical, surgical, family and social history reviewed and updated as indicated. Interim medical history since our last visit reviewed. Allergies and medications reviewed and updated.  Review of Systems  Constitutional: Negative for chills and fever.  Respiratory: Negative for chest tightness and shortness of breath.   Cardiovascular: Negative for chest pain and leg swelling.  Skin: Positive for color change and wound. Negative for rash.  All other systems reviewed and are negative.   Per HPI unless specifically indicated above     Objective:    BP (!) 150/74 (BP Location: Left Arm, Patient Position: Sitting, Cuff Size: Normal)   Pulse 68   Temp 97.7 F (36.5 C) (Oral)   Ht 5\' 2"  (1.575 m)   Wt 170 lb 3.2 oz (77.2 kg)   LMP 12/07/2007   BMI 31.13 kg/m   Wt Readings from Last 3 Encounters:  04/13/17 170 lb 3.2 oz (77.2 kg)  10/18/16 160 lb (72.6 kg)  10/05/16 160 lb (72.6 kg)    Physical Exam  Constitutional: She is oriented to person, place, and time. She appears well-developed and  well-nourished. No distress.  Eyes: Conjunctivae are normal.  Cardiovascular: Normal rate, regular rhythm, normal heart sounds and intact distal pulses.   No murmur heard. Pulmonary/Chest: Effort normal and breath sounds normal. No respiratory distress. She has no wheezes.  Musculoskeletal: Normal range of motion.  Neurological: She is alert and oriented to person, place, and time. Coordination normal.  Skin: Skin is warm and dry. Lesion (Swelling and erythema and warmth on medial paronychia right great toe. Small amount of purulent drainage. Patient appears to have onychocryptosis) noted. No rash noted. She is not diaphoretic.  Psychiatric: She has a normal mood and affect. Her behavior is normal.  Nursing note and vitals reviewed.       Assessment & Plan:   Problem List Items Addressed This Visit      Other   Hyperlipidemia with target LDL less than 100   Relevant Medications   atorvastatin (LIPITOR) 40 MG tablet    Other Visit Diagnoses    Ingrown left greater toenail    -  Primary   Lateral left great toenail   Relevant Medications   sulfamethoxazole-trimethoprim (BACTRIM DS) 800-160 MG tablet      Patient needs refill on cholesterol medication.  Instructed patient to do wedging and take antibiotic and if not improved come  back for partial nail removal.  Follow up plan: Return if symptoms worsen or fail to improve.  Counseling provided for all of the vaccine components No orders of the defined types were placed in this encounter.   Caryl Pina, MD [facility] 04/13/2017, 6:35 PM

## 2017-04-14 MED FILL — ATORVASTATIN 40 MG TABLET: 40 | 90 days supply | Qty: 90 | Fill #0

## 2017-04-25 ENCOUNTER — Ambulatory Visit (INDEPENDENT_AMBULATORY_CARE_PROVIDER_SITE_OTHER): Payer: 59 | Admitting: Nurse Practitioner

## 2017-04-25 ENCOUNTER — Encounter: Payer: Self-pay | Admitting: Nurse Practitioner

## 2017-04-25 ENCOUNTER — Other Ambulatory Visit: Payer: Self-pay | Admitting: Nurse Practitioner

## 2017-04-25 ENCOUNTER — Ambulatory Visit (INDEPENDENT_AMBULATORY_CARE_PROVIDER_SITE_OTHER): Payer: 59

## 2017-04-25 VITALS — BP 124/73 | HR 71 | Temp 97.8°F | Ht 62.0 in | Wt 169.0 lb

## 2017-04-25 DIAGNOSIS — Z780 Asymptomatic menopausal state: Secondary | ICD-10-CM

## 2017-04-25 DIAGNOSIS — M8588 Other specified disorders of bone density and structure, other site: Secondary | ICD-10-CM

## 2017-04-25 DIAGNOSIS — R7989 Other specified abnormal findings of blood chemistry: Secondary | ICD-10-CM

## 2017-04-25 DIAGNOSIS — Z6828 Body mass index (BMI) 28.0-28.9, adult: Secondary | ICD-10-CM | POA: Diagnosis not present

## 2017-04-25 DIAGNOSIS — M8580 Other specified disorders of bone density and structure, unspecified site: Secondary | ICD-10-CM | POA: Diagnosis not present

## 2017-04-25 DIAGNOSIS — E785 Hyperlipidemia, unspecified: Secondary | ICD-10-CM | POA: Diagnosis not present

## 2017-04-25 DIAGNOSIS — N644 Mastodynia: Secondary | ICD-10-CM | POA: Diagnosis not present

## 2017-04-25 DIAGNOSIS — E8881 Metabolic syndrome: Secondary | ICD-10-CM | POA: Diagnosis not present

## 2017-04-25 LAB — CMP14+EGFR
A/G RATIO: 1.7 (ref 1.2–2.2)
ALBUMIN: 4.3 g/dL (ref 3.5–5.5)
ALT: 10 IU/L (ref 0–32)
AST: 14 IU/L (ref 0–40)
Alkaline Phosphatase: 101 IU/L (ref 39–117)
BUN / CREAT RATIO: 11 (ref 9–23)
BUN: 9 mg/dL (ref 6–24)
CHLORIDE: 104 mmol/L (ref 96–106)
CO2: 24 mmol/L (ref 18–29)
Calcium: 9.5 mg/dL (ref 8.7–10.2)
Creatinine, Ser: 0.8 mg/dL (ref 0.57–1.00)
GFR calc non Af Amer: 82 mL/min/{1.73_m2} (ref >59)
GFR, EST AFRICAN AMERICAN: 95 mL/min/{1.73_m2} (ref >59)
GLOBULIN, TOTAL: 2.5 g/dL (ref 1.5–4.5)
Glucose: 84 mg/dL (ref 65–99)
POTASSIUM: 4.1 mmol/L (ref 3.5–5.2)
SODIUM: 143 mmol/L (ref 134–144)
TOTAL PROTEIN: 6.8 g/dL (ref 6.0–8.5)

## 2017-04-25 LAB — LIPID PANEL
CHOL/HDL RATIO: 3.9 ratio (ref 0.0–4.4)
Cholesterol, Total: 172 mg/dL (ref 100–199)
HDL: 44 mg/dL (ref >39)
LDL Calculated: 97 mg/dL (ref 0–99)
Triglycerides: 156 mg/dL — ABNORMAL HIGH (ref 0–149)
VLDL Cholesterol Cal: 31 mg/dL (ref 5–40)

## 2017-04-25 NOTE — Progress Notes (Signed)
Subjective:    Patient ID: Tanya Wilkinson, female    DOB: 1961/01/06, 56 y.o.   MRN: [id number]  HPI  Tanya Wilkinson is here today for follow up of chronic medical problem.  Outpatient Encounter Prescriptions as of 04/25/2017  Medication Sig  . atorvastatin (LIPITOR) 40 MG tablet Take 1 tablet (40 mg total) by mouth daily.  . Calcium Carb-Cholecalciferol (CALCIUM + D3) 600-200 MG-UNIT TABS Take 1 tablet by mouth daily.  . cyclobenzaprine (FLEXERIL) 10 MG tablet Take 1 tablet (10 mg total) by mouth 3 (three) times daily as needed for muscle spasms.  . Multiple Vitamins-Minerals (CENTRUM SILVER PO) Take 1 tablet by mouth daily.  Marland Kitchen terbinafine (LAMISIL) 250 MG tablet Take 1 tablet (250 mg total) by mouth daily.  . meloxicam (MOBIC) 15 MG tablet Take 1 tablet (15 mg total) by mouth daily. (Patient not taking: Reported on 04/25/2017)    1. Osteopenia, unspecified location  Patient says that she does very little exercise  2. BMI 28.0-28.9,adult  NO recent weight gain or weight loss  3. Hyperlipidemia with target LDL less than 100  Tries to watch diet as best she can!  4. Low serum vitamin D  Takes supplements daily  5. Metabolic syndrome  Does not check blood sugar at home    New complaints: Patient c/o left intermittent breast pain- happens at different times and is a stabbing pain that last several seconds. Denies SOB     Review of Systems  Constitutional: Negative for diaphoresis.  Eyes: Negative for pain.  Respiratory: Negative for shortness of breath.   Cardiovascular: Negative for chest pain, palpitations and leg swelling.  Gastrointestinal: Negative for abdominal pain.  Endocrine: Negative for polydipsia.  Skin: Negative for rash.  Neurological: Negative for dizziness, weakness and headaches.  Hematological: Does not bruise/bleed easily.       Objective:   Physical Exam  Constitutional: She is oriented to person, place, and time. She appears well-developed and  well-nourished.  HENT:  Nose: Nose normal.  Mouth/Throat: Oropharynx is clear and moist.  Eyes: EOM are normal.  Neck: Trachea normal, normal range of motion and full passive range of motion without pain. Neck supple. No JVD present. Carotid bruit is not present. No thyromegaly present.  Cardiovascular: Normal rate, regular rhythm, normal heart sounds and intact distal pulses.  Exam reveals no gallop and no friction rub.   No murmur heard. Pulmonary/Chest: Effort normal and breath sounds normal. Left breast exhibits no inverted nipple, no mass, no nipple discharge, no skin change and no tenderness. Breasts are symmetrical.  Abdominal: Soft. Bowel sounds are normal. She exhibits no distension and no mass. There is no tenderness.  Musculoskeletal: Normal range of motion.  Lymphadenopathy:    She has no cervical adenopathy.  Neurological: She is alert and oriented to person, place, and time. She has normal reflexes.  Skin: Skin is warm and dry.  Psychiatric: She has a normal mood and affect. Her behavior is normal. Judgment and thought content normal.   BP 124/73   Pulse 71   Temp 97.8 F (36.6 C) (Oral)   Ht '5\' 2"'  (1.575 m)   Wt 169 lb (76.7 kg)   LMP 12/07/2007   BMI 30.91 kg/m   EKG - NSR    Assessment & Plan:  1. Osteopenia, unspecified location Weight bearing exercises encouraged  2. BMI 28.0-28.9,adult Discussed diet and exercise for person with BMI >25 Will recheck weight in 3-6 months  3. Hyperlipidemia with target  LDL less than 100 Low fta diet - CMP14+EGFR - Lipid panel  4. Low serum vitamin D Continue vitain d supplements  5. Metabolic syndrome Watch carbs in diet  6. Breast pain, left Keep diary- if continues will order diagnostic mammogram - EKG 12-Lead    Labs pending Health maintenance reviewed Diet and exercise encouraged Continue all meds Follow up  In 6 months   [place], FNP

## 2017-04-25 NOTE — Patient Instructions (Signed)
Breast Tenderness Breast tenderness is a common problem for women of all ages. Breast tenderness may cause mild discomfort to severe pain. The pain usually comes and goes in association with your menstrual cycle, but it can be constant. Breast tenderness has many possible causes, including hormone changes and some medicines. Your health care provider may order tests, such as a mammogram or an ultrasound, to check for any unusual findings. Having breast tenderness usually does not mean that you have breast cancer. Follow these instructions at home: Sometimes, reassurance that you do not have breast cancer is all that is needed. In general, follow these home care instructions: Managing pain and discomfort  If directed, apply ice to the area:  Put ice in a plastic bag.  Place a towel between your skin and the bag.  Leave the ice on for 20 minutes, 2-3 times a day.  Make sure you are wearing a supportive bra, especially during exercise. You may also want to wear a supportive bra while sleeping if your breasts are very tender. Medicines  Take over-the-counter and prescription medicines only as told by your health care provider. If the cause of your pain is infection, you may be prescribed an antibiotic medicine.  If you were prescribed an antibiotic, take it as told by your health care provider. Do not stop taking the antibiotic even if you start to feel better. General instructions  Your health care provider may recommend that you reduce the amount of fat in your diet. You can do this by:  Limiting fried foods.  Cooking foods using methods, such as baking, boiling, grilling, and broiling.  Decrease the amount of caffeine in your diet. You can do this by drinking more water and choosing caffeine-free options.  Keep a log of the days and times when your breasts are most tender.  Ask your health care provider how to do breast exams at home. This will help you notice if you have an unusual  growth or lump. Contact a health care provider if:  Any part of your breast is hard, red, and hot to the touch. This may be a sign of infection.  You are not breastfeeding and you have fluid, especially blood or pus, coming out of your nipples.  You have a fever.  You have a new or painful lump in your breast that remains after your menstrual period ends.  Your pain does not improve or it gets worse.  Your pain is interfering with your daily activities. This information is not intended to replace advice given to you by your health care provider. Make sure you discuss any questions you have with your health care provider. Document Released: 11/04/2008 Document Revised: 08/20/2016 Document Reviewed: 08/20/2016 Elsevier Interactive Patient Education  2017 [employer]  

## 2017-05-03 ENCOUNTER — Ambulatory Visit (INDEPENDENT_AMBULATORY_CARE_PROVIDER_SITE_OTHER): Payer: 59 | Admitting: Nurse Practitioner

## 2017-05-03 ENCOUNTER — Encounter: Payer: Self-pay | Admitting: Nurse Practitioner

## 2017-05-03 VITALS — BP 136/88 | HR 77 | Temp 97.4°F | Ht 62.0 in | Wt 168.0 lb

## 2017-05-03 DIAGNOSIS — L600 Ingrowing nail: Secondary | ICD-10-CM

## 2017-05-03 NOTE — Progress Notes (Signed)
   Subjective:    Patient ID: Steve Rattler, female    DOB: 1961/10/06, 56 y.o.   MRN: [id number]  HPI Patient comes into the office to follow-up on a toe infection that was treated with Bactrim on 04/14/17.  Patient says her toe is still hurting on the tip and the skin is dark.  Patient states she has bumped her toe and it was extremely painful.  She has only been able to wear sandals since previous visit.  She is soaking foot twice a day in warm water and Epsom salts and this seems to help with the pain for a couple hours.    Review of Systems  Skin:       Darkness on right great toe and in-grown toenail  All other systems reviewed and are negative.      Objective:   Physical Exam  Constitutional: She is oriented to person, place, and time. She appears well-developed and well-nourished. No distress.  HENT:  Head: Normocephalic.  Nose: Nose normal.  Mouth/Throat: Oropharynx is clear and moist.  Eyes: Pupils are equal, round, and reactive to light.  Neck: Normal range of motion. Neck supple.  Cardiovascular: Normal rate, regular rhythm, normal heart sounds and intact distal pulses.   No murmur heard. Pulmonary/Chest: Effort normal and breath sounds normal. No respiratory distress.  Neurological: She is alert and oriented to person, place, and time.  Skin: Skin is warm and dry.  Right great toe purple and cool to touch.  Toenail is thick and brittle.  In-grown toenail on medial surface.   Psychiatric: She has a normal mood and affect. Her behavior is normal. Judgment and thought content normal.   BP 136/88   Pulse 77   Temp 97.4 F (36.3 C) (Oral)   Ht 5\' 2"  (1.575 m)   Wt 168 lb (76.2 kg)   LMP 12/07/2007   BMI 30.73 kg/m   Procedure: Lidocaine 2% plain with Marcaine0.25% plain 1:1- 6 cc digital block  Cleaned with betadine Toe nail removed with freer elevator and stevens scissors cleaned with NACL Dressing applied    Assessment & Plan:   1. Ingrown left greater  toenail    Leave dressing on until tomorrow soak in epsom salt BID Rto if have any problems  Mary-Margaret Hassell Done, FNP

## 2017-05-03 NOTE — Patient Instructions (Signed)
Ingrown Toenail An ingrown toenail occurs when the corner or sides of your toenail grow into the surrounding skin. The big toe is most commonly affected, but it can happen to any of your toes. If your ingrown toenail is not treated, you will be at risk for infection. What are the causes? This condition may be caused by:  Wearing shoes that are too small or tight.  Injury or trauma, such as stubbing your toe or having your toe stepped on.  Improper cutting or care of your toenails.  Being born with (congenital) nail or foot abnormalities, such as having a nail that is too big for your toe. What increases the risk? Risk factors for an ingrown toenail include:  Age. Your nails tend to thicken as you get older, so ingrown nails are more common in older people.  Diabetes.  Cutting your toenails incorrectly.  Blood circulation problems. What are the signs or symptoms? Symptoms may include:  Pain, soreness, or tenderness.  Redness.  Swelling.  Hardening of the skin surrounding the toe. Your ingrown toenail may be infected if there is fluid, pus, or drainage. How is this diagnosed? An ingrown toenail may be diagnosed by medical history and physical exam. If your toenail is infected, your health care provider may test a sample of the drainage. How is this treated? Treatment depends on the severity of your ingrown toenail. Some ingrown toenails may be treated at home. More severe or infected ingrown toenails may require surgery to remove all or part of the nail. Infected ingrown toenails may also be treated with antibiotic medicines. Follow these instructions at home:  If you were prescribed an antibiotic medicine, finish all of it even if you start to feel better.  Soak your foot in warm soapy water for 20 minutes, 3 times per day or as directed by your health care provider.  Carefully lift the edge of the nail away from the sore skin by wedging a small piece of cotton under the  corner of the nail. This may help with the pain. Be careful not to cause more injury to the area.  Wear shoes that fit well. If your ingrown toenail is causing you pain, try wearing sandals, if possible.  Trim your toenails regularly and carefully. Do not cut them in a curved shape. Cut your toenails straight across. This prevents injury to the skin at the corners of the toenail.  Keep your feet clean and dry.  If you are having trouble walking and are given crutches by your health care provider, use them as directed.  Do not pick at your toenail or try to remove it yourself.  Take medicines only as directed by your health care provider.  Keep all follow-up visits as directed by your health care provider. This is important. Contact a health care provider if:  Your symptoms do not improve with treatment. Get help right away if:  You have red streaks that start at your foot and go up your leg.  You have a fever.  You have increased redness, swelling, or pain.  You have fluid, blood, or pus coming from your toenail. This information is not intended to replace advice given to you by your health care provider. Make sure you discuss any questions you have with your health care provider. Document Released: 11/19/2000 Document Revised: 04/23/2016 Document Reviewed: 10/16/2014 Elsevier Interactive Patient Education  2017 [employer]  

## 2017-05-04 ENCOUNTER — Ambulatory Visit (INDEPENDENT_AMBULATORY_CARE_PROVIDER_SITE_OTHER): Payer: 59 | Admitting: Pharmacist

## 2017-05-04 ENCOUNTER — Encounter: Payer: Self-pay | Admitting: Pharmacist

## 2017-05-04 DIAGNOSIS — M8589 Other specified disorders of bone density and structure, multiple sites: Secondary | ICD-10-CM

## 2017-05-04 DIAGNOSIS — E559 Vitamin D deficiency, unspecified: Secondary | ICD-10-CM | POA: Diagnosis not present

## 2017-05-04 DIAGNOSIS — R7989 Other specified abnormal findings of blood chemistry: Secondary | ICD-10-CM

## 2017-05-04 NOTE — Patient Instructions (Signed)
Switch to taking multivitamin and calcium supplement at different times to increase absorption.                 Exercise for Strong Bones  Exercise is important to build and maintain strong bones / bone density.  There are 2 types of exercises that are important to building and maintaining strong bones:  Weight- bearing and muscle-stregthening.  Weight-bearing Exercises  These exercises include activities that make you move against gravity while staying upright. Weight-bearing exercises can be high-impact or low-impact.  High-impact weight-bearing exercises help build bones and keep them strong. If you have broken a bone due to osteoporosis or are at risk of breaking a bone, you may need to avoid high-impact exercises. If you're not sure, you should check with your healthcare provider.  Examples of high-impact weight-bearing exercises are: Dancing  Doing high-impact aerobics  Hiking  Jogging/running  Jumping Rope  Stair climbing  Tennis  Low-impact weight-bearing exercises can also help keep bones strong and are a safe alternative if you cannot do high-impact exercises.   Examples of low-impact weight-bearing exercises are: Using elliptical training machines  Doing low-impact aerobics  Using stair-step machines  Fast walking on a treadmill or outside   Muscle-Strengthening Exercises These exercises include activities where you move your body, a weight or some other resistance against gravity. They are also known as resistance exercises and include: Lifting weights  Using elastic exercise bands  Using weight machines  Lifting your own body weight  Functional movements, such as standing and rising up on your toes  Yoga and Pilates can also improve strength, balance and flexibility. However, certain positions may not be safe for people with osteoporosis or those at increased risk of broken bones. For example, exercises that have you bend forward may increase the chance of breaking  a bone in the spine.   Non-Impact Exercises There are other types of exercises that can help prevent falls.  Non-impact exercises can help you to improve balance, posture and how well you move in everyday activities. Some of these exercises include: Balance exercises that strengthen your legs and test your balance, such as Tai Chi, can decrease your risk of falls.  Posture exercises that improve your posture and reduce rounded or "sloping" shoulders can help you decrease the chance of breaking a bone, especially in the spine.  Functional exercises that improve how well you move can help you with everyday activities and decrease your chance of falling and breaking a bone. For example, if you have trouble getting up from a chair or climbing stairs, you should do these activities as exercises.   **A physical therapist can teach you balance, posture and functional exercises. He/she can also help you learn which exercises are safe and appropriate for you.  [facility] has a physical therapy office in [place] in front of our office and referrals can be made for assessments and treatment as needed and strength and balance training.  If you would like to have an assessment with [facility] and our physical therapy team please let a nurse or provider know.   Calcium & Vitamin D: The Facts  Why is calcium and vitamin D consumption important? Calcium: . Most Americans do not consume adequate amounts of calcium! Calcium is required for proper muscle function, nerve communication, bone support, and many other functions in the body.  . The body uses bones as a source of calcium. Bones 'remodel' themselves continuously - the body constantly breaks bone down to  release calcium and rebuilds bones by replacing calcium in the bone later.  . As we get older, the rate of bone breakdown occurs faster than bone rebuilding which could lead to osteopenia, osteoporosis, and possible fractures.   Vitamin D: . People naturally  make vitamin D in the body when sunlight hits the skin and triggers a process that leads to vitamin D production. This natural vitamin D production requires about 10-15 minutes of sun exposure on the hands, arms, and face at least 2-3 times per week. However, due to decreased sun exposure and the use of sunscreen, most people will need to get additional vitamin D from foods or supplements. Your doctor can measure your body's vitamin D level through a simple blood test to determine your daily vitamin D needs.  . Vitamin D is used to help the body absorb calcium, maintain bone health, help the immune system, and reduce inflammation. It also plays a role in muscle performance, balance and risk of falling.  . Vitamin D deficiency can lead to osteomalacia or softening of the bones, bone pain, and muscle weakness.   The recommended daily allowance of Calcium and Vitamin D varies for different age groups. Age group Calcium (mg) Vitamin D (IU)  Females and Males: Age 47-50 1000 mg 600 IU  Females: Age 29- 73 1200 mg 600 IU  Males: Age 4-70 1000 mg 600 IU  Females and Males: Age 27+ 1200 mg 800 IU  Pregnant/lactating Females age 23-50 1000 mg 600 IU   How much Calcium do you get in your diet? Calcium Intake # of servings per day  Total calcium (mg)  Skim milk, 2% milk (1 cup) _________ x 300 mg   Yogurt (1 small container) _________ x 200 mg   Cheese (1oz) _________ x 200 mg   Cottage Cheese (1 cup)             ________ x 150 mg   Almond milk (1 cup) _________ x 450 mg   Fortified Orange Juice (1 cup) _________ x 300 mg   Broccoli or spinach ( 1 cup) _________ x 100 mg   Salmon (3 oz) _________ x 150 mg    Almonds (1/4 cup) _______ x 90 mg      How do we get Calcium and Vitamin D in our diet? Calcium: . Obtaining calcium from the diet is the most preferred way to reach the recommended daily goal. If this goal is not reached through diet, calcium supplements are available.  . Calcium is found in  many foods including: dairy products, dark leafy vegetables (like broccoli, kale, and spinach), fish, and fortified products like juices and cereals.  . The food label will have a %DV (percent daily value) listed showing the amount of calcium per serving. To determine the total mg per serving, simply replace the % with zero (0).  For example, Almond Breeze almond milk contains 45% DV of calcium or 457m per 1 cup.  . You can increase the amount of calcium in your diet by using more calcium products in your daily meals. Use yogurt and fruit to make smoothies or use yogurt to top baked potatoes or make whipped potatoes. Sprinkle low fat cheese onto salads or into egg white omelets. You can even add non-fat dry milk powder (3014mcalcium per 1/3 cup) to hot cereals, meat loaf, soups, or potatoes.  . Calcium supplements come in many forms including tablets, chewables, and gummies. Be sure to read the label to determine  the correct number of tablets per serving and whether or not to take the supplement with food.  . Calcium carbonate products (Oscal, Caltrate, and Viactiv) are generally better absorbed when taken with food while calcium citrate products like Citracal can be taken with or without food.  . The body can only absorb about 600 mg of calcium at one time. It is recommended to take calcium supplements in small amounts several times per day.  However, taking it all at once is better than not taking it at all. . Increasing your intake of calcium is essential for bone health, but may also lead to some side effects like constipation, increased gas, bloating or abdominal cramping. To help reduce these side effects, start with 1 tablet per day and slowly increase your intake of the supplement to the recommended doses. It is also recommended that you drink plenty of water each day. Vitamin D: . Very few foods naturally contain vitamin D. However, it is found in saltwater fish (like tuna, salmon and mackerel),  beef liver, egg yolks, cheese and vitamin D fortified foods (like yogurt, cereals, orange juice and milk) . The amount of vitamin D in each food or product is listed as %DV on the product label. To determine the total amount of vitamin D per serving, drop the % sign and multiply the number by 4. For example, 1 cup of Almond Breeze almond milk contains 25% DV vitamin D or 100 IU per serving (25 x 4 =100). . Vitamin D is also found in multivitamins and supplements and may be listed as ergocalciferol (vitamin D2) or cholecalciferol (vitamin D3). Each of these forms of vitamin D are equivalent and the daily recommended intake will vary based on your age and the vitamin D levels in your body. Follow your doctor's recommendation for vitamin D intake.       Fall Prevention in the Home Falls can cause injuries and can affect people from all age groups. There are many simple things that you can do to make your home safe and to help prevent falls. What can I do on the outside of my home?  Regularly repair the edges of walkways and driveways and fix any cracks.  Remove high doorway thresholds.  Trim any shrubbery on the main path into your home.  Use bright outdoor lighting.  Clear walkways of debris and clutter, including tools and rocks.  Regularly check that handrails are securely fastened and in good repair. Both sides of any steps should have handrails.  Install guardrails along the edges of any raised decks or porches.  Have leaves, snow, and ice cleared regularly.  Use sand or salt on walkways during winter months.  In the garage, clean up any spills right away, including grease or oil spills. What can I do in the bathroom?  Use night lights.  Install grab bars by the toilet and in the tub and shower. Do not use towel bars as grab bars.  Use non-skid mats or decals on the floor of the tub or shower.  If you need to sit down while you are in the shower, use a plastic, non-slip  stool.  Keep the floor dry. Immediately clean up any water that spills on the floor.  Remove soap buildup in the tub or shower on a regular basis.  Attach bath mats securely with double-sided non-slip rug tape.  Remove throw rugs and other tripping hazards from the floor. What can I do in the bedroom?  Use night  lights.  Make sure that a bedside light is easy to reach.  Do not use oversized bedding that drapes onto the floor.  Have a firm chair that has side arms to use for getting dressed.  Remove throw rugs and other tripping hazards from the floor. What can I do in the kitchen?  Clean up any spills right away.  Avoid walking on wet floors.  Place frequently used items in easy-to-reach places.  If you need to reach for something above you, use a sturdy step stool that has a grab bar.  Keep electrical cables out of the way.  Do not use floor polish or wax that makes floors slippery. If you have to use wax, make sure that it is non-skid floor wax.  Remove throw rugs and other tripping hazards from the floor. What can I do in the stairways?  Do not leave any items on the stairs.  Make sure that there are handrails on both sides of the stairs. Fix handrails that are broken or loose. Make sure that handrails are as long as the stairways.  Check any carpeting to make sure that it is firmly attached to the stairs. Fix any carpet that is loose or worn.  Avoid having throw rugs at the top or bottom of stairways, or secure the rugs with carpet tape to prevent them from moving.  Make sure that you have a light switch at the top of the stairs and the bottom of the stairs. If you do not have them, have them installed. What are some other fall prevention tips?  Wear closed-toe shoes that fit well and support your feet. Wear shoes that have rubber soles or low heels.  When you use a stepladder, make sure that it is completely opened and that the sides are firmly locked. Have  someone hold the ladder while you are using it. Do not climb a closed stepladder.  Add color or contrast paint or tape to grab bars and handrails in your home. Place contrasting color strips on the first and last steps.  Use mobility aids as needed, such as canes, walkers, scooters, and crutches.  Turn on lights if it is dark. Replace any light bulbs that burn out.  Set up furniture so that there are clear paths. Keep the furniture in the same spot.  Fix any uneven floor surfaces.  Choose a carpet design that does not hide the edge of steps of a stairway.  Be aware of any and all pets.  Review your medicines with your healthcare provider. Some medicines can cause dizziness or changes in blood pressure, which increase your risk of falling. Talk with your health care provider about other ways that you can decrease your risk of falls. This may include working with a physical therapist or trainer to improve your strength, balance, and endurance. This information is not intended to replace advice given to you by your health care provider. Make sure you discuss any questions you have with your health care provider. Document Released: 11/12/2002 Document Revised: 04/20/2016 Document Reviewed: 12/27/2014 Elsevier Interactive Patient Education  2017 [employer].

## 2017-05-04 NOTE — Progress Notes (Signed)
Patient ID: Tanya Wilkinson, female   DOB: November 19, 1961, 56 y.o.   MRN: [id number]        HPI: Does pt already have a diagnosis of:  Osteopenia?  Yes Osteoporosis?  No  Back Pain?  No  - previous back pain related to herniated disc last year.  Pain resolved without surgery but with prednisone treatment.  Kyphosis?  No Prior fracture?  No Med(s) for Osteoporosis/Osteopenia:  Calcium + vitamin D Med(s) previously tried for Osteoporosis/Osteopenia:  none                                                             PMH: Age at menopause:  56 yo Hysterectomy?  No Oophorectomy?  No HRT? No Steroid Use?  No Thyroid med?  No History of cancer?  No History of digestive disorders (ie Crohn's)?  No Current or previous eating disorders?  No Last Vitamin D Result:  12.3 (04/28/2015) Last GFR Result:  82 (05/21/218)   FH/SH: Family history of osteoporosis?  No Parent with history of hip fracture?  No Family history of breast cancer?  Yes - maternal 1/2 aunt Exercise?  Yes - walking 10 minutes twice a day every day Smoking?  No Alcohol?  No    Calcium Assessment Calcium Intake  # of servings/day  Calcium mg  Milk (8 oz) 0  x  300  = 0  Yogurt (4 oz) 1 x  200 = 200mg   Cheese (1 oz) 0 x  200 = 0  Other Calcium sources   250mg   Ca supplement 500mg  from calcium and 400mg  from MVI = 900mg    Estimated calcium intake per day 1350mg     DEXA Results Date of Test T-Score for AP Spine L1-L4 T-Score for Total Left Hip  04/25/2017 -2.3 -2.1  09/04/2014 -1.6 -1.9           FRAX 10 year estimate: Total FX risk:  7.3%  (consider medication if >/= 20%) Hip FX risk:  0.6%  (consider medication if >/= 3%)  Assessment: Osteopenia with decreased BMD compared to 2015.  FRAX estimate is low Low vitamin D   Recommendations: 1.   Discussed BMD  / DEXA results and discussed fracture risk. Discussed pros and cons of pharmacotherapy.  No pharmacotherapy started.  2.  continue calcium 1200mg  daily  through supplementation or diet.  Recommended patient separate calcium and MVI dosing to improve absorption and efficacy Checking vitamin D level today 3.  continue weight bearing exercise - 30 minutes at least 4 days per week.   4.  Counseled and educated about fall risk and prevention.  Recheck DEXA:  1 year  Time spent counseling patient:  30 minutes

## 2017-05-05 ENCOUNTER — Other Ambulatory Visit: Payer: Self-pay | Admitting: Pharmacist

## 2017-05-05 ENCOUNTER — Telehealth: Payer: Self-pay | Admitting: Nurse Practitioner

## 2017-05-05 DIAGNOSIS — M8589 Other specified disorders of bone density and structure, multiple sites: Secondary | ICD-10-CM

## 2017-05-05 DIAGNOSIS — R7989 Other specified abnormal findings of blood chemistry: Secondary | ICD-10-CM

## 2017-05-05 LAB — VITAMIN D 25 HYDROXY (VIT D DEFICIENCY, FRACTURES): VIT D 25 HYDROXY: 19.6 ng/mL — AB (ref 30.0–100.0)

## 2017-05-05 MED ORDER — VITAMIN D (ERGOCALCIFEROL) 1.25 MG (50000 UNIT) PO CAPS: 50000.0000 [IU] | ORAL_CAPSULE | ORAL | 0 refills | Status: DC

## 2017-05-05 MED FILL — VIT D2 1.25 MG (50,000 UNIT: 1.25 MG | 84 days supply | Qty: 12 | Fill #0

## 2017-05-06 NOTE — Telephone Encounter (Signed)
Closing encounter Talked to pt earlier while on phone

## 2017-06-15 ENCOUNTER — Telehealth: Payer: Self-pay | Admitting: Certified Nurse Midwife

## 2017-06-15 NOTE — Telephone Encounter (Signed)
Left message on voicemail to call and reschedule cancelled appointment. °

## 2017-06-24 ENCOUNTER — Telehealth: Payer: Self-pay | Admitting: Nurse Practitioner

## 2017-06-24 NOTE — Telephone Encounter (Signed)
Left detailed message that we do not do toenail removal in the evening clinic

## 2017-06-24 NOTE — Telephone Encounter (Signed)
Yes that is fine

## 2017-06-24 NOTE — Telephone Encounter (Signed)
Are you willing to do this in evening clinic? Please advise

## 2017-06-27 NOTE — Telephone Encounter (Signed)
Patient has an appointment schedule for toenail removal on 06/28/2017.

## 2017-06-28 ENCOUNTER — Ambulatory Visit: Payer: 59 | Admitting: Family

## 2017-07-01 ENCOUNTER — Ambulatory Visit: Payer: 59 | Admitting: Family Medicine

## 2017-07-15 ENCOUNTER — Ambulatory Visit: Payer: 59 | Admitting: Certified Nurse Midwife

## 2017-07-18 ENCOUNTER — Ambulatory Visit (INDEPENDENT_AMBULATORY_CARE_PROVIDER_SITE_OTHER): Payer: 59 | Admitting: Obstetrics and Gynecology

## 2017-07-18 ENCOUNTER — Other Ambulatory Visit: Payer: Self-pay | Admitting: Certified Nurse Midwife

## 2017-07-18 ENCOUNTER — Encounter: Payer: Self-pay | Admitting: Obstetrics and Gynecology

## 2017-07-18 ENCOUNTER — Other Ambulatory Visit: Payer: Self-pay | Admitting: Nurse Practitioner

## 2017-07-18 VITALS — BP 118/62 | HR 68 | Resp 16 | Ht 63.75 in | Wt 173.0 lb

## 2017-07-18 DIAGNOSIS — Z23 Encounter for immunization: Secondary | ICD-10-CM

## 2017-07-18 DIAGNOSIS — Z1231 Encounter for screening mammogram for malignant neoplasm of breast: Secondary | ICD-10-CM

## 2017-07-18 DIAGNOSIS — N8111 Cystocele, midline: Secondary | ICD-10-CM

## 2017-07-18 DIAGNOSIS — Z01419 Encounter for gynecological examination (general) (routine) without abnormal findings: Secondary | ICD-10-CM

## 2017-07-18 DIAGNOSIS — N3941 Urge incontinence: Secondary | ICD-10-CM | POA: Diagnosis not present

## 2017-07-18 NOTE — Patient Instructions (Signed)

## 2017-07-18 NOTE — Progress Notes (Signed)
57 y.o. [id number] MarriedCaucasianF here for annual exam.  No vaginal bleeding. No dyspareunia. She was told she had a cystocele at her last visit. No symptoms of a bulge. She reports occasional urge incontinence. Not using a pad. Occurs if she is over full.  She has osteopenia and low vit d, being managed by primary MD. Exercising and on vit D supplements and calcium.     Patient's last menstrual period was 12/07/2007.          Sexually active: Yes.    The current method of family planning is post menopausal status and tubal ligation.    Exercising: Yes.    walking Smoker:  no  Health Maintenance: Pap:  07/08/16 Pap smear Negative  11-07-13 neg HPV HR neg History of abnormal Pap:  no MMG:  07/08/16 BIRADS 1 negative/density c Colonoscopy: 09/18/2014. Polyp. Repeat 5 years BMD:  04/25/17 - Osteopenia. FRAX 10 year estimate: Total FX risk:  7.3%  (consider medication if >/= 20%) Hip FX risk:  0.6%  (consider medication if >/= 3%) TDaP:  12/06/2005 Gardasil: N/A   reports that she quit smoking about 22 years ago. She has never used smokeless tobacco. She reports that she does not drink alcohol or use drugs. 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 4 grown kids, 2 grand kids, 74 and 2. Family is local.   Past Medical History:  Diagnosis Date  . Herniated disc   . Hyperlipidemia   . Osteopenia   . Vitamin D deficiency     Past Surgical History:  Procedure Laterality Date  . ANTERIOR CERVICAL DECOMP/DISCECTOMY FUSION  08/2006  . CESAREAN SECTION  [id number]  . [facility] [place]   . TUBAL LIGATION  [id number]   BTL  Got pregnant after her first Tubal Ligation, second tubal after the last baby.   Current Outpatient Prescriptions  Medication Sig Dispense Refill  . atorvastatin (LIPITOR) 40 MG tablet Take 1 tablet (40 mg total) by mouth daily. 90 tablet 1  . Calcium Carb-Cholecalciferol (CALCIUM + D3) 600-200 MG-UNIT TABS Take 1 tablet by mouth daily.    . cyclobenzaprine  (FLEXERIL) 10 MG tablet Take 1 tablet (10 mg total) by mouth 3 (three) times daily as needed for muscle spasms. 30 tablet 1  . Multiple Vitamins-Minerals (CENTRUM SILVER PO) Take 1 tablet by mouth daily.    Marland Kitchen terbinafine (LAMISIL) 250 MG tablet Take 1 tablet (250 mg total) by mouth daily. 90 tablet 0  . Vitamin D, Ergocalciferol, (DRISDOL) 50000 units CAPS capsule Take 1 capsule (50,000 Units total) by mouth every 7 (seven) days. 12 capsule 0   No current facility-administered medications for this visit.     Family History  Problem Relation Age of Onset  . Diabetes Mother   . Hypertension Mother   . Cancer Father        throat & lung-smoker  . Stroke Father   . Stroke Brother   . Hypertension Brother   . Cancer Brother        bladder  . Stroke Brother   . Early death Brother 65  . Colon cancer Neg Hx   . Pancreatic cancer Neg Hx   . Stomach cancer Neg Hx   . Esophageal cancer Neg Hx   Brother died this year from bladder cancer. Was paralyzed years earlier after a MVA.   Review of Systems  Constitutional: Negative.   HENT: Negative.   Eyes: Negative.   Respiratory: Negative.  Cardiovascular: Negative.   Gastrointestinal: Negative.   Endocrine: Negative.   Genitourinary: Negative.   Musculoskeletal: Negative.   Skin: Negative.   Allergic/Immunologic: Negative.   Neurological: Negative.   Hematological: Negative.   Psychiatric/Behavioral: Negative.     Exam:   BP 118/62 (BP Location: Right Arm, Patient Position: Sitting, Cuff Size: Normal)   Pulse 68   Resp 16   Ht 5' 3.75" (1.619 m)   Wt 173 lb (78.5 kg)   LMP 12/07/2007   BMI 29.93 kg/m   Weight change: @WEIGHTCHANGE @ Height:   Height: 5' 3.75" (161.9 cm)  Ht Readings from Last 3 Encounters:  07/18/17 5' 3.75" (1.619 m)  05/03/17 5\' 2"  (1.575 m)  04/25/17 5\' 2"  (1.575 m)    General appearance: alert, cooperative and appears stated age Head: Normocephalic, without obvious abnormality, atraumatic Neck: no  adenopathy, supple, symmetrical, trachea midline and thyroid normal to inspection and palpation Lungs: clear to auscultation bilaterally Cardiovascular: regular rate and rhythm Breasts: normal appearance, no masses or tenderness Abdomen: soft, non-tender; bowel sounds normal; no masses,  no organomegaly Extremities: extremities normal, atraumatic, no cyanosis or edema Skin: Skin color, texture, turgor normal. No rashes or lesions Lymph nodes: Cervical, supraclavicular, and axillary nodes normal. No abnormal inguinal nodes palpated Neurologic: Grossly normal   Pelvic: External genitalia:  no lesions              Urethra:  normal appearing urethra with no masses, tenderness or lesions              Bartholins and Skenes: normal                 Vagina: atrophic vagina, small grade 2 cystocele (within one cm of introitus), no discharge              Cervix: no lesions               Bimanual Exam:  Uterus:  normal size, contour, position, consistency, mobility, non-tender              Adnexa: no mass, fullness, tenderness               Rectovaginal: Confirms               Anus:  normal sphincter tone, no lesions  Chaperone was present for exam.  A:  Well Woman with normal exam  Urge incontinence, tolerable  Cystocele, not symptomatic  P:   No pap this year  Mammogram due  TDAP today  Screening labs with primary  Discussed urge incontinence, avoid caffeine, discussed options for treatment  Discussed breast self awareness  Discussed calcium and vit D intake

## 2017-07-22 MED FILL — ATORVASTATIN 40 MG TABLET: 40 | 90 days supply | Qty: 90 | Fill #1

## 2017-07-27 ENCOUNTER — Ambulatory Visit
Admission: RE | Admit: 2017-07-27 | Discharge: 2017-07-27 | Disposition: A | Discharge status: Home / Self Care | Payer: 59 | Source: Ambulatory Visit | Attending: Nurse Practitioner | Admitting: Nurse Practitioner

## 2017-07-27 DIAGNOSIS — Z1231 Encounter for screening mammogram for malignant neoplasm of breast: Secondary | ICD-10-CM

## 2017-08-04 ENCOUNTER — Other Ambulatory Visit: Payer: Self-pay

## 2017-08-17 ENCOUNTER — Encounter: Payer: Self-pay | Admitting: Nurse Practitioner

## 2017-08-17 ENCOUNTER — Ambulatory Visit (INDEPENDENT_AMBULATORY_CARE_PROVIDER_SITE_OTHER): Payer: 59 | Admitting: Nurse Practitioner

## 2017-08-17 VITALS — BP 130/76 | HR 79 | Temp 98.1°F | Ht 63.75 in | Wt 171.8 lb

## 2017-08-17 DIAGNOSIS — J029 Acute pharyngitis, unspecified: Secondary | ICD-10-CM | POA: Diagnosis not present

## 2017-08-17 DIAGNOSIS — M8580 Other specified disorders of bone density and structure, unspecified site: Secondary | ICD-10-CM | POA: Diagnosis not present

## 2017-08-17 LAB — RAPID STREP SCREEN (MED CTR MEBANE ONLY): Strep Gp A Ag, IA W/Reflex: NEGATIVE

## 2017-08-17 LAB — CULTURE, GROUP A STREP

## 2017-08-17 NOTE — Patient Instructions (Signed)
Force fluids °Motrin or tylenol OTC °OTC decongestant °Throat lozenges if help °New toothbrush in 3 days ° °

## 2017-08-17 NOTE — Addendum Note (Signed)
Addended by: Antonietta Barcelona D on: 08/17/2017 05:48 PM   Modules accepted: Orders

## 2017-08-17 NOTE — Progress Notes (Signed)
   Subjective:    Patient ID: Tanya Wilkinson, female    DOB: [birth year], 56 y.o.   MRN: [id number]  HPI Patient comes in today c/o sore throat that started yesterday. Slight congestion. Denies fever and cough    Review of Systems  Constitutional: Negative for appetite change, chills, fatigue and fever.  HENT: Positive for congestion and sore throat. Negative for trouble swallowing.   Respiratory: Negative for cough and shortness of breath.   Cardiovascular: Negative.   Gastrointestinal: Negative.   Genitourinary: Negative.   Neurological: Negative.   Psychiatric/Behavioral: Negative.   All other systems reviewed and are negative.      Objective:   Physical Exam  Constitutional: She is oriented to person, place, and time. She appears well-developed and well-nourished.  HENT:  Right Ear: Hearing, tympanic membrane, external ear and ear canal normal.  Left Ear: Hearing, tympanic membrane, external ear and ear canal normal.  Nose: Mucosal edema present. Right sinus exhibits no maxillary sinus tenderness and no frontal sinus tenderness. Left sinus exhibits no maxillary sinus tenderness and no frontal sinus tenderness.  Mouth/Throat: Uvula is midline and oropharynx is clear and moist. No posterior oropharyngeal edema or posterior oropharyngeal erythema.  Cardiovascular: Normal rate and regular rhythm.   Pulmonary/Chest: Effort normal and breath sounds normal.  Abdominal: Soft.  Neurological: She is alert and oriented to person, place, and time.  Psychiatric: She has a normal mood and affect. Her behavior is normal. Judgment and thought content normal.   BP 130/76   Pulse 79   Temp 98.1 F (36.7 C) (Oral)   Ht 5' 3.75" (1.619 m)   Wt 171 lb 12.8 oz (77.9 kg)   LMP 12/07/2007   BMI 29.72 kg/m       Assessment & Plan:   1. Pharyngitis, unspecified etiology    Force fluids Motrin or tylenol OTC OTC decongestant Throat lozenges if help New toothbrush in 3  days  Mary-Margaret Hassell Done, FNP

## 2017-08-18 LAB — VITAMIN D 25 HYDROXY (VIT D DEFICIENCY, FRACTURES): VIT D 25 HYDROXY: 24.1 ng/mL — AB (ref 30.0–100.0)

## 2017-10-28 ENCOUNTER — Ambulatory Visit: Payer: 59 | Admitting: Nurse Practitioner

## 2017-12-14 ENCOUNTER — Other Ambulatory Visit: Payer: Self-pay | Admitting: Nurse Practitioner

## 2017-12-14 DIAGNOSIS — E785 Hyperlipidemia, unspecified: Secondary | ICD-10-CM

## 2017-12-14 MED FILL — ATORVASTATIN 40 MG TABLET: 40 | 90 days supply | Qty: 90 | Fill #0

## 2017-12-30 ENCOUNTER — Ambulatory Visit (INDEPENDENT_AMBULATORY_CARE_PROVIDER_SITE_OTHER): Payer: No Typology Code available for payment source | Admitting: Nurse Practitioner

## 2017-12-30 ENCOUNTER — Encounter: Payer: Self-pay | Admitting: Nurse Practitioner

## 2017-12-30 VITALS — BP 138/87 | HR 75 | Temp 97.3°F | Ht 63.0 in | Wt 168.0 lb

## 2017-12-30 DIAGNOSIS — Z6828 Body mass index (BMI) 28.0-28.9, adult: Secondary | ICD-10-CM

## 2017-12-30 DIAGNOSIS — E785 Hyperlipidemia, unspecified: Secondary | ICD-10-CM

## 2017-12-30 DIAGNOSIS — Z0000 Encounter for general adult medical examination without abnormal findings: Secondary | ICD-10-CM | POA: Diagnosis not present

## 2017-12-30 DIAGNOSIS — M8580 Other specified disorders of bone density and structure, unspecified site: Secondary | ICD-10-CM

## 2017-12-30 DIAGNOSIS — E8881 Metabolic syndrome: Secondary | ICD-10-CM

## 2017-12-30 DIAGNOSIS — R7989 Other specified abnormal findings of blood chemistry: Secondary | ICD-10-CM

## 2017-12-30 MED ORDER — ATORVASTATIN CALCIUM 40 MG PO TABS: 40.0000 mg | ORAL_TABLET | Freq: Every day | ORAL | 1 refills | Status: DC

## 2017-12-30 NOTE — Progress Notes (Signed)
 Subjective:    Patient ID: Tanya Wilkinson, female    DOB: 10/01/1961, 56 y.o.   MRN: [id number]  HPI   Tanya Wilkinson is here today for annual physical exam and follow up of chronic medical problem.  Outpatient Encounter Medications as of 12/30/2017  Medication Sig  . atorvastatin (LIPITOR) 40 MG tablet TAKE 1 TABLET BY MOUTH DAILY.  . calcium-vitamin D 250-100 MG-UNIT tablet Take 1 tablet by mouth 2 (two) times daily.  . Multiple Vitamins-Minerals (CENTRUM SILVER PO) Take 1 tablet by mouth daily.  . Vitamin D, Ergocalciferol, (DRISDOL) 50000 units CAPS capsule Take 1 capsule (50,000 Units total) by mouth every 7 (seven) days.   1. Annual physical exam   2. Osteopenia, unspecified location  No c/o back pain. Last dexa scan was 04/25/17- unchanged  3. Hyperlipidemia with target LDL less than 100  Tries to watch diet  4. Metabolic syndrome  Does not check blood sugars at home  5. BMI 28.0-28.9,adult  No recent weight changes  6. Low serum vitamin D  Takes vitamin d daily    New complaints: None today  Social history: Patient olds son passed away several months ago- had brain aneurysm   Review of Systems  Constitutional: Negative for activity change and appetite change.  HENT: Negative.   Eyes: Negative for pain.  Respiratory: Negative for shortness of breath.   Cardiovascular: Negative for chest pain, palpitations and leg swelling.  Gastrointestinal: Negative for abdominal pain.  Endocrine: Negative for polydipsia.  Genitourinary: Negative.   Skin: Negative for rash.  Neurological: Negative for dizziness, weakness and headaches.  Hematological: Does not bruise/bleed easily.  Psychiatric/Behavioral: Negative.   All other systems reviewed and are negative.      Objective:   Physical Exam  Constitutional: She is oriented to person, place, and time. She appears well-developed and well-nourished.  HENT:  Nose: Nose normal.  Mouth/Throat: Oropharynx is clear and moist.    Eyes: EOM are normal.  Neck: Trachea normal, normal range of motion and full passive range of motion without pain. Neck supple. No JVD present. Carotid bruit is not present. No thyromegaly present.  Cardiovascular: Normal rate, regular rhythm, normal heart sounds and intact distal pulses. Exam reveals no gallop and no friction rub.  No murmur heard. Pulmonary/Chest: Effort normal and breath sounds normal.  Abdominal: Soft. Bowel sounds are normal. She exhibits no distension and no mass. There is no tenderness.  Musculoskeletal: Normal range of motion.  Lymphadenopathy:    She has no cervical adenopathy.  Neurological: She is alert and oriented to person, place, and time. She has normal reflexes.  Skin: Skin is warm and dry.  Psychiatric: She has a normal mood and affect. Her behavior is normal. Judgment and thought content normal.    BP 138/87   Pulse 75   Temp (!) 97.3 F (36.3 C) (Oral)   Ht 5' 3" (1.6 m)   Wt 168 lb (76.2 kg)   LMP 12/07/2007   BMI 29.76 kg/m        Assessment & Plan:  1. Annual physical exam  2. Osteopenia, unspecified location Weight bearing exercises  3. Hyperlipidemia with target LDL less than 100 Low fat diet - atorvastatin (LIPITOR) 40 MG tablet; Take 1 tablet (40 mg total) by mouth daily.  Dispense: 90 tablet; Refill: 1 - Lipid panel  4. Metabolic syndrome Watch carbsin diet - CMP14+EGFR  5. BMI 28.0-28.9,adult Discussed diet and exercise for person with BMI >25 Will recheck weight   in 3-6 months  6. Low serum vitamin D - VITAMIN D 25 Hydroxy (Vit-D Deficiency, Fractures)    Labs pending Health maintenance reviewed Diet and exercise encouraged Continue all meds Follow up  In 6 months   [place], FNP

## 2017-12-30 NOTE — Patient Instructions (Signed)

## 2017-12-31 LAB — LIPID PANEL
CHOL/HDL RATIO: 4.3 ratio (ref 0.0–4.4)
Cholesterol, Total: 201 mg/dL — ABNORMAL HIGH (ref 100–199)
HDL: 47 mg/dL (ref >39)
LDL CALC: 122 mg/dL — AB (ref 0–99)
Triglycerides: 161 mg/dL — ABNORMAL HIGH (ref 0–149)
VLDL Cholesterol Cal: 32 mg/dL (ref 5–40)

## 2017-12-31 LAB — CMP14+EGFR
ALBUMIN: 4.4 g/dL (ref 3.5–5.5)
ALK PHOS: 107 IU/L (ref 39–117)
ALT: 13 IU/L (ref 0–32)
AST: 17 IU/L (ref 0–40)
Albumin/Globulin Ratio: 1.9 (ref 1.2–2.2)
BUN / CREAT RATIO: 12 (ref 9–23)
BUN: 9 mg/dL (ref 6–24)
Bilirubin Total: 0.4 mg/dL (ref 0.0–1.2)
CO2: 24 mmol/L (ref 20–29)
CREATININE: 0.78 mg/dL (ref 0.57–1.00)
Calcium: 9.9 mg/dL (ref 8.7–10.2)
Chloride: 102 mmol/L (ref 96–106)
GFR calc Af Amer: 97 mL/min/{1.73_m2} (ref >59)
GFR calc non Af Amer: 84 mL/min/{1.73_m2} (ref >59)
GLOBULIN, TOTAL: 2.3 g/dL (ref 1.5–4.5)
Glucose: 105 mg/dL — ABNORMAL HIGH (ref 65–99)
Potassium: 5.1 mmol/L (ref 3.5–5.2)
SODIUM: 141 mmol/L (ref 134–144)
Total Protein: 6.7 g/dL (ref 6.0–8.5)

## 2017-12-31 LAB — VITAMIN D 25 HYDROXY (VIT D DEFICIENCY, FRACTURES): VIT D 25 HYDROXY: 20.5 ng/mL — AB (ref 30.0–100.0)

## 2018-01-03 ENCOUNTER — Encounter: Payer: Self-pay | Admitting: Nurse Practitioner

## 2018-02-20 ENCOUNTER — Encounter: Payer: Self-pay | Admitting: Pediatrics

## 2018-02-20 ENCOUNTER — Ambulatory Visit (INDEPENDENT_AMBULATORY_CARE_PROVIDER_SITE_OTHER): Payer: No Typology Code available for payment source | Admitting: Pediatrics

## 2018-02-20 VITALS — BP 144/80 | HR 70 | Temp 97.1°F | Ht 63.0 in | Wt 174.6 lb

## 2018-02-20 DIAGNOSIS — M5432 Sciatica, left side: Secondary | ICD-10-CM | POA: Diagnosis not present

## 2018-02-20 MED ORDER — PREDNISONE 10 MG (21) PO TBPK: ORAL_TABLET | Freq: Every day | ORAL | 0 refills | Status: DC

## 2018-02-20 NOTE — Patient Instructions (Addendum)
Ibuprofen 600mg  three times a day for the next few days

## 2018-02-20 NOTE — Progress Notes (Signed)
  Subjective:   Patient ID: Tanya Wilkinson, female    DOB: July 14, 1961, 57 y.o.   MRN: [id number] CC: pain in left hip down leg  HPI: Tanya Wilkinson is a 57 y.o. female presenting for pain in left hip down leg  Started about 3 weeks ago.  Worse after she picks up something heavy. Taking 200mg  ibuprofen 1-2 times a day.  Feels a pain going from her low back down into the back of her leg on the left side.  Sometimes it goes all the way down to her foot.  The same side that is been affected in the past.  No personal history of cancer.  No trouble emptying her bladder.  No weakness or numbness in either leg.    Had similar pain about a year and a half ago.  Was seen by neurosurgery after an MRI found a bulging disc.  At that time she did get some steroid injections.  Back pain got better over time and she did not proceed to surgery.  She has been pain-free for the last few months until this recent exacerbation.  Relevant past medical, surgical, family and social history reviewed. Allergies and medications reviewed and updated. Social History   Tobacco Use  Smoking Status Former Smoker  . Last attempt to quit: 12/06/1994  . Years since quitting: 23.2  Smokeless Tobacco Never Used   ROS: Per HPI   Objective:    BP (!) 144/80   Pulse 70   Temp (!) 97.1 F (36.2 C) (Oral)   Ht 5\' 3"  (1.6 m)   Wt 174 lb 9.6 oz (79.2 kg)   LMP 12/07/2007   BMI 30.93 kg/m   Wt Readings from Last 3 Encounters:  02/20/18 174 lb 9.6 oz (79.2 kg)  12/30/17 168 lb (76.2 kg)  08/17/17 171 lb 12.8 oz (77.9 kg)    Gen: NAD, alert, cooperative with exam, NCAT EYES: EOMI, no conjunctival injection, or no icterus CV: NRRR, normal S1/S2, no murmur, distal pulses 2+ b/l Resp: CTABL, no wheezes, normal WOB Ext: No edema, warm Neuro: Alert and oriented, strength equal b/l UE and LE, coordination grossly normal.   2+ patellar reflex bilaterally.  Sensation intact bilaterally. MSK: No point tenderness over spine.    Assessment & Plan:  Tanya Wilkinson was seen today for pain in left hip down leg.  Diagnoses and all orders for this visit:  Sciatica of left side Gentle back exercises, rest, NSAIDs.  Continue gentle walking regularly.  Start below.  If not improving over the next couple weeks rtc.   -     predniSONE (STERAPRED UNI-PAK 21 TAB) 10 MG (21) TBPK tablet; Take by mouth daily. As directed x 6 days  Elevated blood pressure Keep an eye on blood pressures while on prednisone and NSAIDs. If regularly elevated at home let us know.  Follow up plan: As needed. Assunta Found, MD [place]

## 2018-02-21 MED FILL — predniSONE 10 MG (21) TBPK: 10 | 6 days supply | Qty: 21 | Fill #0

## 2018-03-10 ENCOUNTER — Other Ambulatory Visit: Payer: Self-pay | Admitting: Nurse Practitioner

## 2018-03-10 DIAGNOSIS — E785 Hyperlipidemia, unspecified: Secondary | ICD-10-CM

## 2018-03-10 MED FILL — ATORVASTATIN 40 MG TABLET: 40 | 90 days supply | Qty: 90 | Fill #0

## 2018-03-28 ENCOUNTER — Ambulatory Visit (INDEPENDENT_AMBULATORY_CARE_PROVIDER_SITE_OTHER): Payer: No Typology Code available for payment source | Admitting: Nurse Practitioner

## 2018-03-28 ENCOUNTER — Ambulatory Visit: Payer: No Typology Code available for payment source

## 2018-03-28 ENCOUNTER — Encounter: Payer: Self-pay | Admitting: Nurse Practitioner

## 2018-03-28 VITALS — BP 133/79 | HR 79 | Temp 97.4°F | Ht 63.0 in | Wt 174.0 lb

## 2018-03-28 DIAGNOSIS — R7989 Other specified abnormal findings of blood chemistry: Secondary | ICD-10-CM | POA: Diagnosis not present

## 2018-03-28 DIAGNOSIS — M8580 Other specified disorders of bone density and structure, unspecified site: Secondary | ICD-10-CM

## 2018-03-28 DIAGNOSIS — R5383 Other fatigue: Secondary | ICD-10-CM | POA: Diagnosis not present

## 2018-03-28 DIAGNOSIS — E785 Hyperlipidemia, unspecified: Secondary | ICD-10-CM | POA: Diagnosis not present

## 2018-03-28 DIAGNOSIS — Z6828 Body mass index (BMI) 28.0-28.9, adult: Secondary | ICD-10-CM | POA: Diagnosis not present

## 2018-03-28 DIAGNOSIS — E8881 Metabolic syndrome: Secondary | ICD-10-CM | POA: Diagnosis not present

## 2018-03-28 MED ORDER — ATORVASTATIN CALCIUM 40 MG PO TABS: 40.0000 mg | ORAL_TABLET | Freq: Every day | ORAL | 1 refills | Status: DC

## 2018-03-28 NOTE — Patient Instructions (Signed)
Bone Health Bones protect organs, store calcium, and anchor muscles. Good health habits, such as eating nutritious foods and exercising regularly, are important for maintaining healthy bones. They can also help to prevent a condition that causes bones to lose density and become weak and brittle (osteoporosis). Why is bone mass important? Bone mass refers to the amount of bone tissue that you have. The higher your bone mass, the stronger your bones. An important step toward having healthy bones throughout life is to have strong and dense bones during childhood. A young adult who has a high bone mass is more likely to have a high bone mass later in life. Bone mass at its greatest it is called peak bone mass. A large decline in bone mass occurs in older adults. In women, it occurs about the time of menopause. During this time, it is important to practice good health habits, because if more bone is lost than what is replaced, the bones will become less healthy and more likely to break (fracture). If you find that you have a low bone mass, you may be able to prevent osteoporosis or further bone loss by changing your diet and lifestyle. How can I find out if my bone mass is low? Bone mass can be measured with an X-ray test that is called a bone mineral density (BMD) test. This test is recommended for all women who are age 65 or older. It may also be recommended for men who are age 70 or older, or for people who are more likely to develop osteoporosis due to:  Having bones that break easily.  Having a long-term disease that weakens bones, such as kidney disease or rheumatoid arthritis.  Having menopause earlier than normal.  Taking medicine that weakens bones, such as steroids, thyroid hormones, or hormone treatment for breast cancer or prostate cancer.  Smoking.  Drinking three or more alcoholic drinks each day.  What are the nutritional recommendations for healthy bones? To have healthy bones, you  need to get enough of the right minerals and vitamins. Most nutrition experts recommend getting these nutrients from the foods that you eat. Nutritional recommendations vary from person to person. Ask your health care provider what is healthy for you. Here are some general guidelines. Calcium Recommendations Calcium is the most important (essential) mineral for bone health. Most people can get enough calcium from their diet, but supplements may be recommended for people who are at risk for osteoporosis. Good sources of calcium include:  Dairy products, such as low-fat or nonfat milk, cheese, and yogurt.  Dark green leafy vegetables, such as bok choy and broccoli.  Calcium-fortified foods, such as orange juice, cereal, bread, soy beverages, and tofu products.  Nuts, such as almonds.  Follow these recommended amounts for daily calcium intake:  Children, age 1?3: 700 mg.  Children, age 4?8: 1,000 mg.  Children, age 9?13: 1,300 mg.  Teens, age 14?18: 1,300 mg.  Adults, age 19?50: 1,000 mg.  Adults, age 51?70: ? Men: 1,000 mg. ? Women: 1,200 mg.  Adults, age 71 or older: 1,200 mg.  Pregnant and breastfeeding females: ? Teens: 1,300 mg. ? Adults: 1,000 mg.  Vitamin D Recommendations Vitamin D is the most essential vitamin for bone health. It helps the body to absorb calcium. Sunlight stimulates the skin to make vitamin D, so be sure to get enough sunlight. If you live in a cold climate or you do not get outside often, your health care provider may recommend that you take vitamin   D supplements. Good sources of vitamin D in your diet include:  Egg yolks.  Saltwater fish.  Milk and cereal fortified with vitamin D.  Follow these recommended amounts for daily vitamin D intake:  Children and teens, age 1?18: 600 international units.  Adults, age 50 or younger: 400-800 international units.  Adults, age 51 or older: 800-1,000 international units.  Other Nutrients Other nutrients  for bone health include:  Phosphorus. This mineral is found in meat, poultry, dairy foods, nuts, and legumes. The recommended daily intake for adult men and adult women is 700 mg.  Magnesium. This mineral is found in seeds, nuts, dark green vegetables, and legumes. The recommended daily intake for adult men is 400?420 mg. For adult women, it is 310?320 mg.  Vitamin K. This vitamin is found in green leafy vegetables. The recommended daily intake is 120 mg for adult men and 90 mg for adult women.  What type of physical activity is best for building and maintaining healthy bones? Weight-bearing and strength-building activities are important for building and maintaining peak bone mass. Weight-bearing activities cause muscles and bones to work against gravity. Strength-building activities increases muscle strength that supports bones. Weight-bearing and muscle-building activities include:  Walking and hiking.  Jogging and running.  Dancing.  Gym exercises.  Lifting weights.  Tennis and racquetball.  Climbing stairs.  Aerobics.  Adults should get at least 30 minutes of moderate physical activity on most days. Children should get at least 60 minutes of moderate physical activity on most days. Ask your health care provide what type of exercise is best for you. Where can I find more information? For more information, check out the following websites:  National Osteoporosis Foundation: http://nof.org/learn/basics  National Institutes of Health: http://www.niams.nih.gov/Health_Info/Bone/Bone_Health/bone_health_for_life.asp  This information is not intended to replace advice given to you by your health care provider. Make sure you discuss any questions you have with your health care provider. Document Released: 02/12/2004 Document Revised: 06/11/2016 Document Reviewed: 11/27/2014 Elsevier Interactive Patient Education  2018 Elsevier Inc.  

## 2018-03-28 NOTE — Progress Notes (Signed)
Subjective:    Patient ID: Tanya Wilkinson, female    DOB: [birth year], 59 y.o.   MRN: [id number]  HPI   Tanya Wilkinson is here today for follow up of chronic medical problem.  Outpatient Encounter Medications as of 03/28/2018  Medication Sig  . atorvastatin (LIPITOR) 40 MG tablet Take 1 tablet (40 mg total) by mouth daily.  Marland Kitchen atorvastatin (LIPITOR) 40 MG tablet TAKE 1 TABLET BY MOUTH ONCE DAILY  . CALCIUM PO Take by mouth.  . Cholecalciferol (VITAMIN D) 2000 units CAPS Take by mouth.  . Multiple Vitamins-Minerals (CENTRUM SILVER PO) Take 1 tablet by mouth daily.  . predniSONE (STERAPRED UNI-PAK 21 TAB) 10 MG (21) TBPK tablet Take by mouth daily. As directed x 6 days     1. Osteopenia, unspecified location  Last dexascan was done on 09/04/14 with t score of -1.5. Does very little weight bearing exercises  2. Metabolic syndrome  Does not check blood sugars at home and does not really watch diet  3. Hyperlipidemia with target LDL less than 100  Does not watch fat in diets  4. Low serum vitamin D  Takes multivitamin daily that contains vitamin d  5. BMI 28.0-28.9,adult  No recent weight changes    New complaints: Has had a little more fatigue then usual  Social history: Lives with husband- he works second shift   Review of Systems  Constitutional: Negative for activity change and appetite change.  HENT: Negative.   Eyes: Negative for pain.  Respiratory: Negative for shortness of breath.   Cardiovascular: Negative for chest pain, palpitations and leg swelling.  Gastrointestinal: Negative for abdominal pain.  Endocrine: Negative for polydipsia.  Genitourinary: Negative.   Skin: Negative for rash.  Neurological: Negative for dizziness, weakness and headaches.  Hematological: Does not bruise/bleed easily.  Psychiatric/Behavioral: Negative.   All other systems reviewed and are negative.      Objective:   Physical Exam  Constitutional: She is oriented to person, place, and  time. She appears well-developed and well-nourished.  HENT:  Nose: Nose normal.  Mouth/Throat: Oropharynx is clear and moist.  Eyes: EOM are normal.  Neck: Trachea normal, normal range of motion and full passive range of motion without pain. Neck supple. No JVD present. Carotid bruit is not present. No thyromegaly present.  Cardiovascular: Normal rate, regular rhythm, normal heart sounds and intact distal pulses. Exam reveals no gallop and no friction rub.  No murmur heard. Pulmonary/Chest: Effort normal and breath sounds normal.  Abdominal: Soft. Bowel sounds are normal. She exhibits no distension and no mass. There is no tenderness.  Musculoskeletal: Normal range of motion.  Lymphadenopathy:    She has no cervical adenopathy.  Neurological: She is alert and oriented to person, place, and time. She has normal reflexes.  Skin: Skin is warm and dry.  Psychiatric: She has a normal mood and affect. Her behavior is normal. Judgment and thought content normal.   BP 133/79   Pulse 79   Temp (!) 97.4 F (36.3 C) (Oral)   Ht '5\' 3"'  (1.6 m)   Wt 174 lb (78.9 kg)   LMP 12/07/2007   BMI 30.82 kg/m      Assessment & Plan:  1. Osteopenia, unspecified location Weight bearing exercise encouraged - DG [facility] DEXA  2. Metabolic syndrome Watch carbsin diet  3. Hyperlipidemia with target LDL less than 100 Low fat diet - CMP14+EGFR - Lipid panel - atorvastatin (LIPITOR) 40 MG tablet; Take 1 tablet (40 mg  total) by mouth daily.  Dispense: 90 tablet; Refill: 1  4. Low serum vitamin D - VITAMIN D 25 Hydroxy (Vit-D Deficiency, Fractures)  5. BMI 28.0-28.9,adult Discussed diet and exercise for person with BMI >25 Will recheck weight in 3-6 months  6. Other fatigue - CBC with Differential/Platelet - Thyroid Panel With TSH    Labs pending Health maintenance reviewed Diet and exercise encouraged Continue all meds Follow up  In 6 months   [place], FNP

## 2018-03-29 LAB — CBC WITH DIFFERENTIAL/PLATELET
BASOS: 0 %
Basophils Absolute: 0 10*3/uL (ref 0.0–0.2)
EOS (ABSOLUTE): 0.2 10*3/uL (ref 0.0–0.4)
EOS: 2 %
Hematocrit: 37.7 % (ref 34.0–46.6)
Hemoglobin: 12.3 g/dL (ref 11.1–15.9)
Immature Grans (Abs): 0 10*3/uL (ref 0.0–0.1)
Immature Granulocytes: 0 %
LYMPHS ABS: 2.6 10*3/uL (ref 0.7–3.1)
Lymphs: 35 %
MCH: 30.4 pg (ref 26.6–33.0)
MCHC: 32.6 g/dL (ref 31.5–35.7)
MCV: 93 fL (ref 79–97)
MONOCYTES: 9 %
Monocytes Absolute: 0.7 10*3/uL (ref 0.1–0.9)
Neutrophils Absolute: 4 10*3/uL (ref 1.4–7.0)
Neutrophils: 54 %
Platelets: 322 10*3/uL (ref 150–379)
RBC: 4.04 x10E6/uL (ref 3.77–5.28)
RDW: 13.2 % (ref 12.3–15.4)
WBC: 7.5 10*3/uL (ref 3.4–10.8)

## 2018-03-29 LAB — CMP14+EGFR
A/G RATIO: 2.2 (ref 1.2–2.2)
ALT: 18 IU/L (ref 0–32)
AST: 19 IU/L (ref 0–40)
Albumin: 4.6 g/dL (ref 3.5–5.5)
Alkaline Phosphatase: 111 IU/L (ref 39–117)
BUN/Creatinine Ratio: 15 (ref 9–23)
BUN: 11 mg/dL (ref 6–24)
Bilirubin Total: 0.3 mg/dL (ref 0.0–1.2)
CO2: 27 mmol/L (ref 20–29)
Calcium: 9.6 mg/dL (ref 8.7–10.2)
Chloride: 101 mmol/L (ref 96–106)
Creatinine, Ser: 0.74 mg/dL (ref 0.57–1.00)
GFR calc Af Amer: 103 mL/min/{1.73_m2} (ref >59)
GFR calc non Af Amer: 90 mL/min/{1.73_m2} (ref >59)
Globulin, Total: 2.1 g/dL (ref 1.5–4.5)
Glucose: 91 mg/dL (ref 65–99)
POTASSIUM: 4.9 mmol/L (ref 3.5–5.2)
Sodium: 141 mmol/L (ref 134–144)
Total Protein: 6.7 g/dL (ref 6.0–8.5)

## 2018-03-29 LAB — LIPID PANEL
CHOLESTEROL TOTAL: 214 mg/dL — AB (ref 100–199)
Chol/HDL Ratio: 5 ratio — ABNORMAL HIGH (ref 0.0–4.4)
HDL: 43 mg/dL (ref >39)
TRIGLYCERIDES: 409 mg/dL — AB (ref 0–149)

## 2018-03-29 LAB — THYROID PANEL WITH TSH
FREE THYROXINE INDEX: 1.6 (ref 1.2–4.9)
T3 UPTAKE RATIO: 23 % — AB (ref 24–39)
T4 TOTAL: 7.1 ug/dL (ref 4.5–12.0)
TSH: 3.66 u[IU]/mL (ref 0.450–4.500)

## 2018-03-29 LAB — VITAMIN D 25 HYDROXY (VIT D DEFICIENCY, FRACTURES): Vit D, 25-Hydroxy: 21.4 ng/mL — ABNORMAL LOW (ref 30.0–100.0)

## 2018-05-23 ENCOUNTER — Ambulatory Visit (INDEPENDENT_AMBULATORY_CARE_PROVIDER_SITE_OTHER): Payer: No Typology Code available for payment source | Admitting: Nurse Practitioner

## 2018-05-23 ENCOUNTER — Encounter: Payer: Self-pay | Admitting: Nurse Practitioner

## 2018-05-23 ENCOUNTER — Ambulatory Visit: Payer: No Typology Code available for payment source | Admitting: Nurse Practitioner

## 2018-05-23 VITALS — BP 133/83 | HR 97 | Temp 97.4°F | Ht 63.0 in | Wt 174.0 lb

## 2018-05-23 DIAGNOSIS — L600 Ingrowing nail: Secondary | ICD-10-CM | POA: Diagnosis not present

## 2018-05-23 MED ORDER — CIPROFLOXACIN HCL 500 MG PO TABS: 500.0000 mg | ORAL_TABLET | Freq: Two times a day (BID) | ORAL | 0 refills | Status: DC

## 2018-05-23 NOTE — Patient Instructions (Signed)
Ingrown Toenail An ingrown toenail occurs when the corner or sides of your toenail grow into the surrounding skin. The big toe is most commonly affected, but it can happen to any of your toes. If your ingrown toenail is not treated, you will be at risk for infection. What are the causes? This condition may be caused by:  Wearing shoes that are too small or tight.  Injury or trauma, such as stubbing your toe or having your toe stepped on.  Improper cutting or care of your toenails.  Being born with (congenital) nail or foot abnormalities, such as having a nail that is too big for your toe.  What increases the risk? Risk factors for an ingrown toenail include:  Age. Your nails tend to thicken as you get older, so ingrown nails are more common in older people.  Diabetes.  Cutting your toenails incorrectly.  Blood circulation problems.  What are the signs or symptoms? Symptoms may include:  Pain, soreness, or tenderness.  Redness.  Swelling.  Hardening of the skin surrounding the toe.  Your ingrown toenail may be infected if there is fluid, pus, or drainage. How is this diagnosed? An ingrown toenail may be diagnosed by medical history and physical exam. If your toenail is infected, your health care provider may test a sample of the drainage. How is this treated? Treatment depends on the severity of your ingrown toenail. Some ingrown toenails may be treated at home. More severe or infected ingrown toenails may require surgery to remove all or part of the nail. Infected ingrown toenails may also be treated with antibiotic medicines. Follow these instructions at home:  If you were prescribed an antibiotic medicine, finish all of it even if you start to feel better.  Soak your foot in warm soapy water for 20 minutes, 3 times per day or as directed by your health care provider.  Carefully lift the edge of the nail away from the sore skin by wedging a small piece of cotton under  the corner of the nail. This may help with the pain. Be careful not to cause more injury to the area.  Wear shoes that fit well. If your ingrown toenail is causing you pain, try wearing sandals, if possible.  Trim your toenails regularly and carefully. Do not cut them in a curved shape. Cut your toenails straight across. This prevents injury to the skin at the corners of the toenail.  Keep your feet clean and dry.  If you are having trouble walking and are given crutches by your health care provider, use them as directed.  Do not pick at your toenail or try to remove it yourself.  Take medicines only as directed by your health care provider.  Keep all follow-up visits as directed by your health care provider. This is important. Contact a health care provider if:  Your symptoms do not improve with treatment. Get help right away if:  You have red streaks that start at your foot and go up your leg.  You have a fever.  You have increased redness, swelling, or pain.  You have fluid, blood, or pus coming from your toenail. This information is not intended to replace advice given to you by your health care provider. Make sure you discuss any questions you have with your health care provider. Document Released: 11/19/2000 Document Revised: 04/23/2016 Document Reviewed: 10/16/2014 Elsevier Interactive Patient Education  2018 Elsevier Inc.  

## 2018-05-23 NOTE — Progress Notes (Signed)
   Subjective:    Patient ID: Tanya Wilkinson, female    DOB: May 05, 1961, 57 y.o.   MRN: [id number]   Chief Complaint: Right big toe red and painful   HPI Patient come sin c/o of right toe nail pain. She had an ingrown toenail there last year and we removed the entire nail. It started growing in again several months ago and now it is tender to touch and draining.   Review of Systems  Constitutional: Negative.   Respiratory: Negative.   Cardiovascular: Negative.   Gastrointestinal: Negative.   Genitourinary: Negative.   Neurological: Negative.   Psychiatric/Behavioral: Negative.   All other systems reviewed and are negative.      Objective:   Physical Exam  Constitutional: She is oriented to person, place, and time. She appears well-developed and well-nourished. She appears distressed (mild).  Cardiovascular: Normal rate.  Pulmonary/Chest: Effort normal.  Neurological: She is alert and oriented to person, place, and time.  Skin: Skin is warm.  Right great toe nail border erythematous and sore to touch   BP 133/83   Pulse 97   Temp (!) 97.4 F (36.3 C) (Oral)   Ht 5\' 3"  (1.6 m)   Wt 174 lb (78.9 kg)   LMP 12/07/2007   BMI 30.82 kg/m         Assessment & Plan:  JAYD FORREY in today with chief complaint of Right big toe red and painful   1. Ingrown right big toenail Meds ordered this encounter  Medications  . ciprofloxacin (CIPRO) 500 MG tablet    Sig: Take 1 tablet (500 mg total) by mouth 2 (two) times daily.    Dispense:  20 tablet    Refill:  0    Order Specific Question:   Supervising Provider    Answer:   Evette Doffing, CAROL L [[id number]]   Wedging toenail demonstrated Continue epsom salt soaks RTO prn  Mary-Margaret Hassell Done, FNP

## 2018-06-22 MED FILL — ATORVASTATIN 40 MG TABLET: 40 | 90 days supply | Qty: 90 | Fill #0

## 2018-07-06 ENCOUNTER — Other Ambulatory Visit: Payer: Self-pay | Admitting: Nurse Practitioner

## 2018-07-06 ENCOUNTER — Encounter: Payer: Self-pay | Admitting: Nurse Practitioner

## 2018-07-06 ENCOUNTER — Ambulatory Visit (INDEPENDENT_AMBULATORY_CARE_PROVIDER_SITE_OTHER): Payer: No Typology Code available for payment source | Admitting: Nurse Practitioner

## 2018-07-06 VITALS — BP 115/78 | HR 67 | Temp 98.2°F | Ht 63.0 in | Wt 174.0 lb

## 2018-07-06 DIAGNOSIS — E8881 Metabolic syndrome: Secondary | ICD-10-CM | POA: Diagnosis not present

## 2018-07-06 DIAGNOSIS — Z1211 Encounter for screening for malignant neoplasm of colon: Secondary | ICD-10-CM

## 2018-07-06 DIAGNOSIS — E785 Hyperlipidemia, unspecified: Secondary | ICD-10-CM | POA: Diagnosis not present

## 2018-07-06 DIAGNOSIS — M8580 Other specified disorders of bone density and structure, unspecified site: Secondary | ICD-10-CM

## 2018-07-06 DIAGNOSIS — Z1212 Encounter for screening for malignant neoplasm of rectum: Secondary | ICD-10-CM

## 2018-07-06 DIAGNOSIS — Z1231 Encounter for screening mammogram for malignant neoplasm of breast: Secondary | ICD-10-CM

## 2018-07-06 DIAGNOSIS — R7989 Other specified abnormal findings of blood chemistry: Secondary | ICD-10-CM

## 2018-07-06 DIAGNOSIS — Z6828 Body mass index (BMI) 28.0-28.9, adult: Secondary | ICD-10-CM

## 2018-07-06 MED ORDER — ATORVASTATIN CALCIUM 40 MG PO TABS: 40.0000 mg | ORAL_TABLET | Freq: Every day | ORAL | 1 refills | Status: DC

## 2018-07-06 MED ORDER — OMEGA-3-ACID ETHYL ESTERS 1 G PO CAPS: 1.0000 g | ORAL_CAPSULE | Freq: Two times a day (BID) | ORAL | 1 refills | Status: DC

## 2018-07-06 MED FILL — OMEGA-3 ETHYL ESTERS 1 GM C: 1 | 45 days supply | Qty: 90 | Fill #0

## 2018-07-06 NOTE — Addendum Note (Signed)
Addended by: Rolena Infante on: 07/06/2018 10:44 AM   Modules accepted: Orders

## 2018-07-06 NOTE — Patient Instructions (Signed)
Your doctor has prescribed Cologuard, an easy-to-use, noninvasive test for colon cancer screening, based on the latest advances in stool DNA science.   Here's what will happen next:  1. You may receive a call or email from [facility] to confirm your mailing address and insurance information 2. Your kit will be shipped directly to you 3. You collet your stool sample in the privacy of your own home 4. You return the kit via [facility] shipping or pick-up, in the same box it arrived in 5. You doctor will contact you with the results once they are available  Screening for colon cancer is very important to your good health, so if you have any questions at all, please call [facility] Customer Support Specialists at [phone]. They are available 24 hours a day, 6 days a week.

## 2018-07-06 NOTE — Progress Notes (Signed)
Subjective:    Patient ID: Tanya Wilkinson, female    DOB: [birth year], 59 y.o.   MRN: [id number]   Chief Complaint: Medical Management of Chronic Issues   HPI:  1. Osteopenia, unspecified location  Last BMD test was done 04/25/17. t score was -2.3. She does very little exercise. Does take daily vitamin d supplement.  2. Metabolic syndrome  She does not check her blood sugars at home- she tries  To watch catrbs in diet -her husband is diabetic so she tries to avoid buying a lot of sweets  3. Hyperlipidemia with target LDL less than 100  Doe snot eat a lot of fried foods. Does take lipitor daily  4. BMI 28.0-28.9,adult  No recent weight chnages  5. Low serum vitamin D  Takes supplement daily    Outpatient Encounter Medications as of 07/06/2018  Medication Sig  . atorvastatin (LIPITOR) 40 MG tablet Take 1 tablet (40 mg total) by mouth daily.  Marland Kitchen CALCIUM PO Take by mouth.  . Cholecalciferol (VITAMIN D) 2000 units CAPS Take by mouth.  . Multiple Vitamins-Minerals (CENTRUM SILVER PO) Take 1 tablet by mouth daily.   New complaints: None todat  Social history: Works for [employer]    Review of Systems  Constitutional: Negative for activity change and appetite change.  HENT: Negative.   Eyes: Negative for pain.  Respiratory: Negative for shortness of breath.   Cardiovascular: Negative for chest pain, palpitations and leg swelling.  Gastrointestinal: Negative for abdominal pain.  Endocrine: Negative for polydipsia.  Genitourinary: Negative.   Skin: Negative for rash.  Neurological: Negative for dizziness, weakness and headaches.  Hematological: Does not bruise/bleed easily.  Psychiatric/Behavioral: Negative.   All other systems reviewed and are negative.      Objective:   Physical Exam  Constitutional: She is oriented to person, place, and time. She appears well-developed and well-nourished.  HENT:  Head: Normocephalic.  Nose: Nose normal.  Mouth/Throat: Oropharynx is  clear and moist.  Eyes: Pupils are equal, round, and reactive to light. EOM are normal.  Neck: Normal range of motion. Neck supple. No JVD present. Carotid bruit is not present.  Cardiovascular: Normal rate, regular rhythm, normal heart sounds and intact distal pulses.  Pulmonary/Chest: Effort normal and breath sounds normal. No respiratory distress. She has no wheezes. She has no rales. She exhibits no tenderness.  Abdominal: Soft. Normal appearance, normal aorta and bowel sounds are normal. She exhibits no distension, no abdominal bruit, no pulsatile midline mass and no mass. There is no splenomegaly or hepatomegaly. There is no tenderness.  Musculoskeletal: Normal range of motion. She exhibits no edema.  Lymphadenopathy:    She has no cervical adenopathy.  Neurological: She is alert and oriented to person, place, and time. She has normal reflexes.  Skin: Skin is warm and dry.  Psychiatric: She has a normal mood and affect. Her behavior is normal. Judgment and thought content normal.  Nursing note and vitals reviewed.   BP 115/78   Pulse 67   Temp 98.2 F (36.8 C) (Oral)   Ht 5\' 3"  (1.6 m)   Wt 174 lb (78.9 kg)   LMP 12/07/2007   BMI 30.82 kg/m      Assessment & Plan:  AMAND LEMOINE comes in today with chief complaint of Medical Management of Chronic Issues   Diagnosis and orders addressed:  1. Osteopenia, unspecified location Weight bearing exercises  2. Metabolic syndrome Watch carbs in diet  3. Hyperlipidemia with target LDL less  than 100 Low fat diet - omega-3 acid ethyl esters (LOVAZA) 1 g capsule; Take 1 capsule (1 g total) by mouth 2 (two) times daily.  Dispense: 90 capsule; Refill: 1 - atorvastatin (LIPITOR) 40 MG tablet; Take 1 tablet (40 mg total) by mouth daily.  Dispense: 90 tablet; Refill: 1  4. BMI 28.0-28.9,adult Discussed diet and exercise for person with BMI >25 Will recheck weight in 3-6 months  5. Low serum vitamin D Continue vitamin d  supplement  6. Encounter for colorectal cancer screening - Cologuard   Labs pending Health Maintenance reviewed Diet and exercise encouraged  Follow up plan: 3 months   Mary-Margaret Hassell Done, FNP

## 2018-07-07 LAB — CMP14+EGFR
ALT: 13 IU/L (ref 0–32)
AST: 18 IU/L (ref 0–40)
Albumin/Globulin Ratio: 1.9 (ref 1.2–2.2)
Albumin: 4.5 g/dL (ref 3.5–5.5)
Alkaline Phosphatase: 113 IU/L (ref 39–117)
BILIRUBIN TOTAL: 0.5 mg/dL (ref 0.0–1.2)
BUN/Creatinine Ratio: 12 (ref 9–23)
BUN: 9 mg/dL (ref 6–24)
CALCIUM: 10 mg/dL (ref 8.7–10.2)
CHLORIDE: 101 mmol/L (ref 96–106)
CO2: 25 mmol/L (ref 20–29)
Creatinine, Ser: 0.76 mg/dL (ref 0.57–1.00)
GFR calc Af Amer: 99 mL/min/{1.73_m2} (ref >59)
GFR calc non Af Amer: 86 mL/min/{1.73_m2} (ref >59)
GLUCOSE: 101 mg/dL — AB (ref 65–99)
Globulin, Total: 2.4 g/dL (ref 1.5–4.5)
POTASSIUM: 5.2 mmol/L (ref 3.5–5.2)
Sodium: 144 mmol/L (ref 134–144)
Total Protein: 6.9 g/dL (ref 6.0–8.5)

## 2018-07-07 LAB — LIPID PANEL
Chol/HDL Ratio: 4.4 ratio (ref 0.0–4.4)
Cholesterol, Total: 199 mg/dL (ref 100–199)
HDL: 45 mg/dL (ref >39)
LDL Calculated: 120 mg/dL — ABNORMAL HIGH (ref 0–99)
TRIGLYCERIDES: 170 mg/dL — AB (ref 0–149)
VLDL CHOLESTEROL CAL: 34 mg/dL (ref 5–40)

## 2018-07-19 ENCOUNTER — Other Ambulatory Visit: Payer: Self-pay

## 2018-07-19 ENCOUNTER — Other Ambulatory Visit (HOSPITAL_COMMUNITY)
Admission: RE | Admit: 2018-07-19 | Discharge: 2018-07-19 | Disposition: A | Discharge status: Home / Self Care | Payer: No Typology Code available for payment source | Source: Ambulatory Visit | Attending: Certified Nurse Midwife | Admitting: Certified Nurse Midwife

## 2018-07-19 ENCOUNTER — Encounter: Payer: Self-pay | Admitting: Certified Nurse Midwife

## 2018-07-19 ENCOUNTER — Ambulatory Visit (INDEPENDENT_AMBULATORY_CARE_PROVIDER_SITE_OTHER): Payer: No Typology Code available for payment source | Admitting: Certified Nurse Midwife

## 2018-07-19 VITALS — BP 110/70 | HR 68 | Resp 16 | Ht 63.25 in | Wt 175.0 lb

## 2018-07-19 DIAGNOSIS — Z124 Encounter for screening for malignant neoplasm of cervix: Secondary | ICD-10-CM

## 2018-07-19 DIAGNOSIS — N814 Uterovaginal prolapse, unspecified: Secondary | ICD-10-CM

## 2018-07-19 DIAGNOSIS — Z659 Problem related to unspecified psychosocial circumstances: Secondary | ICD-10-CM

## 2018-07-19 DIAGNOSIS — Z01419 Encounter for gynecological examination (general) (routine) without abnormal findings: Secondary | ICD-10-CM | POA: Diagnosis not present

## 2018-07-19 NOTE — Patient Instructions (Signed)

## 2018-07-19 NOTE — Progress Notes (Signed)
57 y.o. [id number] Married  Caucasian Fe here for annual exam. Menopausal no HRT. Denies vaginal bleeding or vaginal dryness. Sees PCP for cholesterol , osteopenia, Vitamin D management, labs and aex. Social stress with death of son with kidney issues and brother's death. Emotionally coping with spouse support. Occasional stress incontinence with holding urine too long. No other health issues today.  Patient's last menstrual period was 12/07/2007.          Sexually active: Yes.    The current method of family planning is tubal ligation & post menopausal.    Exercising: Yes.    walking 2 times weekly Smoker:  no  Review of Systems  Constitutional: Negative.   HENT: Negative.   Eyes: Negative.   Respiratory: Negative.   Cardiovascular: Negative.   Gastrointestinal: Negative.   Genitourinary: Negative.   Musculoskeletal: Negative.   Skin: Negative.   Neurological: Negative.   Endo/Heme/Allergies: Negative.   Psychiatric/Behavioral: Negative.     Health Maintenance: Pap:  07-08-16 neg History of Abnormal Pap: no MMG:  07-27-17 category c density birads 1:neg Self Breast exams: yes Colonoscopy:  2015 polyp f/u 66yrs BMD:   2018 osteopenia TDaP:  2018 Shingles: not done Pneumonia: not done Hep C and HIV: hep c neg 2016 Labs: pcp   reports that she quit smoking about 23 years ago. She has never used smokeless tobacco. She reports that she does not drink alcohol or use drugs.  Past Medical History:  Diagnosis Date  . Herniated disc   . Hyperlipidemia   . Osteopenia   . Vitamin D deficiency     Past Surgical History:  Procedure Laterality Date  . ANTERIOR CERVICAL DECOMP/DISCECTOMY FUSION  08/2006  . CESAREAN SECTION  [id number]  . COLONOSCOPY  [facility] in [place]   . TUBAL LIGATION  [id number]   BTL    Current Outpatient Medications  Medication Sig Dispense Refill  . atorvastatin (LIPITOR) 40 MG tablet Take 1 tablet (40 mg total) by mouth daily. 90 tablet 1  . CALCIUM  PO Take by mouth.    . Cholecalciferol (VITAMIN D) 2000 units CAPS Take by mouth.    . Multiple Vitamins-Minerals (CENTRUM SILVER PO) Take 1 tablet by mouth daily.    Marland Kitchen omega-3 acid ethyl esters (LOVAZA) 1 g capsule Take 1 capsule (1 g total) by mouth 2 (two) times daily. 90 capsule 1   No current facility-administered medications for this visit.     Family History  Problem Relation Age of Onset  . Diabetes Mother   . Hypertension Mother   . Kidney disease Mother   . Cancer Father        throat & lung-smoker  . Stroke Father   . Stroke Brother   . Hypertension Brother   . Cancer Brother 58       bladder  . Stroke Brother   . Early death Brother 10  . Stroke Son   . Hypertension Son   . Colon cancer Neg Hx   . Pancreatic cancer Neg Hx   . Stomach cancer Neg Hx   . Esophageal cancer Neg Hx   . Breast cancer Neg Hx     ROS:  Pertinent items are noted in HPI.  Otherwise, a comprehensive ROS was negative.  Exam:   BP 110/70   Pulse 68   Resp 16   Ht 5' 3.25" (1.607 m)   Wt 175 lb (79.4 kg)   LMP 12/07/2007   BMI 30.76 kg/m  Height: 5' 3.25" (160.7 cm) Ht Readings from Last 3 Encounters:  07/19/18 5' 3.25" (1.607 m)  07/06/18 5\' 3"  (1.6 m)  05/23/18 5\' 3"  (1.6 m)    General appearance: alert, cooperative and appears stated age Head: Normocephalic, without obvious abnormality, atraumatic Neck: no adenopathy, supple, symmetrical, trachea midline and thyroid normal to inspection and palpation Lungs: clear to auscultation bilaterally Breasts: normal appearance, no masses or tenderness, No nipple retraction or dimpling, No nipple discharge or bleeding, No axillary or supraclavicular adenopathy Heart: regular rate and rhythm Abdomen: soft, non-tender; no masses,  no organomegaly Extremities: extremities normal, atraumatic, no cyanosis or edema Skin: Skin color, texture, turgor normal. No rashes or lesions Lymph nodes: Cervical, supraclavicular, and axillary nodes  normal. No abnormal inguinal nodes palpated Neurologic: Grossly normal   Pelvic: External genitalia:  no lesions, normal female              Urethra:  normal appearing urethra with no masses, tenderness or lesions              Bartholin's and Skene's: normal                 Vagina: normal appearing vagina with normal color and discharge, no lesions with grade 1 cystocele present              Cervix: multiparous appearance, no cervical motion tenderness and no lesions              Pap taken: Yes.   Bimanual Exam:  Uterus:  enlarged, first weeks size and prolapsed first degree              Adnexa: normal adnexa and no mass, fullness, tenderness               Rectovaginal: Confirms               Anus:  normal sphincter tone, no lesions  Chaperone present: yes  A:  Well Woman with normal exam  Menopausal no HRT  Cystocele with grade 1 uterine prolapse  Occasional stress incontinence  Social stress with death of son and brother, has good family support  PCP management of cholesterol, vitamin D and Osteopenia per last BMD  P:   Reviewed health and wellness pertinent to exam  Patient aware of need to advise if vaginal bleeding.  Discussed continue to work on kegel exercise and no prolonged holding of urine. Will advise if problems occur.  Continue to seek friends and family support as needed  Pap smear: yes   counseled on breast self exam, mammography screening, feminine hygiene, adequate intake of calcium and vitamin D, diet and exercise, Kegel's exercises  return annually or prn  An After Visit Summary was printed and given to the patient.

## 2018-07-21 LAB — CYTOLOGY - PAP
DIAGNOSIS: NEGATIVE
HPV: NOT DETECTED

## 2018-08-21 ENCOUNTER — Ambulatory Visit
Admission: RE | Admit: 2018-08-21 | Discharge: 2018-08-21 | Disposition: A | Discharge status: Home / Self Care | Payer: No Typology Code available for payment source | Source: Ambulatory Visit | Attending: Nurse Practitioner | Admitting: Nurse Practitioner

## 2018-08-21 DIAGNOSIS — Z1231 Encounter for screening mammogram for malignant neoplasm of breast: Secondary | ICD-10-CM

## 2018-08-30 ENCOUNTER — Ambulatory Visit (INDEPENDENT_AMBULATORY_CARE_PROVIDER_SITE_OTHER): Payer: No Typology Code available for payment source | Admitting: Family

## 2018-08-30 ENCOUNTER — Encounter: Payer: Self-pay | Admitting: Family

## 2018-08-30 VITALS — BP 140/80 | HR 63 | Temp 97.5°F | Ht 63.25 in | Wt 177.6 lb

## 2018-08-30 DIAGNOSIS — L03031 Cellulitis of right toe: Secondary | ICD-10-CM

## 2018-08-30 DIAGNOSIS — L600 Ingrowing nail: Secondary | ICD-10-CM

## 2018-08-30 MED ORDER — CEPHALEXIN 500 MG PO CAPS: 500.0000 mg | ORAL_CAPSULE | Freq: Two times a day (BID) | ORAL | 0 refills | Status: DC

## 2018-08-30 MED ORDER — MUPIROCIN 2 % EX OINT: 1.0000 "application " | TOPICAL_OINTMENT | Freq: Two times a day (BID) | CUTANEOUS | 0 refills | Status: DC

## 2018-08-30 MED FILL — MUPIROCIN 2% OINTMENT: 2 | 14 days supply | Qty: 22 | Fill #0

## 2018-08-30 MED FILL — CEPHALEXIN 500 MG CAPSULE: 500 | 7 days supply | Qty: 14 | Fill #0

## 2018-08-30 NOTE — Patient Instructions (Signed)
Ingrown Toenail An ingrown toenail occurs when the corner or sides of your toenail grow into the surrounding skin. The big toe is most commonly affected, but it can happen to any of your toes. If your ingrown toenail is not treated, you will be at risk for infection. What are the causes? This condition may be caused by:  Wearing shoes that are too small or tight.  Injury or trauma, such as stubbing your toe or having your toe stepped on.  Improper cutting or care of your toenails.  Being born with (congenital) nail or foot abnormalities, such as having a nail that is too big for your toe.  What increases the risk? Risk factors for an ingrown toenail include:  Age. Your nails tend to thicken as you get older, so ingrown nails are more common in older people.  Diabetes.  Cutting your toenails incorrectly.  Blood circulation problems.  What are the signs or symptoms? Symptoms may include:  Pain, soreness, or tenderness.  Redness.  Swelling.  Hardening of the skin surrounding the toe.  Your ingrown toenail may be infected if there is fluid, pus, or drainage. How is this diagnosed? An ingrown toenail may be diagnosed by medical history and physical exam. If your toenail is infected, your health care provider may test a sample of the drainage. How is this treated? Treatment depends on the severity of your ingrown toenail. Some ingrown toenails may be treated at home. More severe or infected ingrown toenails may require surgery to remove all or part of the nail. Infected ingrown toenails may also be treated with antibiotic medicines. Follow these instructions at home:  If you were prescribed an antibiotic medicine, finish all of it even if you start to feel better.  Soak your foot in warm soapy water for 20 minutes, 3 times per day or as directed by your health care provider.  Carefully lift the edge of the nail away from the sore skin by wedging a small piece of cotton under  the corner of the nail. This may help with the pain. Be careful not to cause more injury to the area.  Wear shoes that fit well. If your ingrown toenail is causing you pain, try wearing sandals, if possible.  Trim your toenails regularly and carefully. Do not cut them in a curved shape. Cut your toenails straight across. This prevents injury to the skin at the corners of the toenail.  Keep your feet clean and dry.  If you are having trouble walking and are given crutches by your health care provider, use them as directed.  Do not pick at your toenail or try to remove it yourself.  Take medicines only as directed by your health care provider.  Keep all follow-up visits as directed by your health care provider. This is important. Contact a health care provider if:  Your symptoms do not improve with treatment. Get help right away if:  You have red streaks that start at your foot and go up your leg.  You have a fever.  You have increased redness, swelling, or pain.  You have fluid, blood, or pus coming from your toenail. This information is not intended to replace advice given to you by your health care provider. Make sure you discuss any questions you have with your health care provider. Document Released: 11/19/2000 Document Revised: 04/23/2016 Document Reviewed: 10/16/2014 Elsevier Interactive Patient Education  2018 Elsevier Inc.  

## 2018-08-30 NOTE — Progress Notes (Signed)
   Subjective:    Patient ID: Tanya Wilkinson, female    DOB: February 01, 1961, 57 y.o.   MRN: [id number]  Chief Complaint  Patient presents with  . infection in right great toe    HPI Pt presents to the office today with recurrent right great toenail swelling, tenderness, pain, and discharge that started over the last week and becoming worse. She has tried wedging, getting pedicures, and soaking her feet.   She states she is having intermittent  aching pain of 6 out 10 that is worse when she walks.     Review of Systems  All other systems reviewed and are negative.      Objective:   Physical Exam  Constitutional: She is oriented to person, place, and time. She appears well-developed and well-nourished. No distress.  HENT:  Head: Normocephalic.  Eyes: Pupils are equal, round, and reactive to light.  Neck: Normal range of motion. Neck supple. No thyromegaly present.  Cardiovascular: Normal rate, regular rhythm, normal heart sounds and intact distal pulses.  No murmur heard. Pulmonary/Chest: Effort normal and breath sounds normal. No respiratory distress. She has no wheezes.  Abdominal: Soft. Bowel sounds are normal. She exhibits no distension. There is no tenderness.  Musculoskeletal: Normal range of motion. She exhibits no tenderness.  Neurological: She is alert and oriented to person, place, and time. She has normal reflexes. No cranial nerve deficit.  Skin: Skin is warm and dry.  Right great medial toe erythemas, warm, and yellow dishcarge  Psychiatric: She has a normal mood and affect. Her behavior is normal. Judgment and thought content normal.  Vitals reviewed.     BP 140/80   Pulse 63   Temp (!) 97.5 F (36.4 C) (Oral)   Ht 5' 3.25" (1.607 m)   Wt 177 lb 9.6 oz (80.6 kg)   LMP 12/07/2007   BMI 31.21 kg/m      Assessment & Plan:  Tanya Wilkinson comes in today with chief complaint of infection in right great toe   Diagnosis and orders addressed:  1. Ingrown  toenail with infection - Ambulatory referral to Podiatry - cephALEXin (KEFLEX) 500 MG capsule; Take 1 capsule (500 mg total) by mouth 2 (two) times daily.  Dispense: 14 capsule; Refill: 0 - mupirocin ointment (BACTROBAN) 2 %; Apply 1 application topically 2 (two) times daily.  Dispense: 22 g; Refill: 0  2. Paronychia of great toe, right - Ambulatory referral to Podiatry - cephALEXin (KEFLEX) 500 MG capsule; Take 1 capsule (500 mg total) by mouth 2 (two) times daily.  Dispense: 14 capsule; Refill: 0 - mupirocin ointment (BACTROBAN) 2 %; Apply 1 application topically 2 (two) times daily.  Dispense: 22 g; Refill: 0  Soak foot for 20 mins Continue to Wedge Keep clean and dry Follow up with Podiatry  RTO as needed or if symptoms worsen or do not improve    Evelina Dun, FNP

## 2018-09-11 ENCOUNTER — Ambulatory Visit: Payer: No Typology Code available for payment source | Admitting: Podiatry

## 2018-09-11 ENCOUNTER — Encounter: Payer: Self-pay | Admitting: Podiatry

## 2018-09-11 DIAGNOSIS — L600 Ingrowing nail: Secondary | ICD-10-CM

## 2018-09-11 MED ORDER — NEOMYCIN-POLYMYXIN-HC 3.5-10000-1 OT SOLN: OTIC | 1 refills | Status: DC

## 2018-09-11 MED FILL — NEO/POLYMYXIN/HC EAR SOLN: 3.5-10000-1 | 30 days supply | Qty: 10 | Fill #0

## 2018-09-11 NOTE — Progress Notes (Signed)
   Subjective:    Patient ID: Tanya Wilkinson, female    DOB: 1961-09-22, 57 y.o.   MRN: [id number]  HPI    Review of Systems  All other systems reviewed and are negative.      Objective:   Physical Exam        Assessment & Plan:

## 2018-09-11 NOTE — Patient Instructions (Signed)

## 2018-09-13 NOTE — Progress Notes (Signed)
Subjective:   Patient ID: Tanya Wilkinson, female   DOB: 57 y.o.   MRN: [id number]   HPI Patient presents stating this ingrown toenail has been chronically bothering me and I have had it removed before without success and I have had an infection just finished antibiotics and do not have current drainage.  Patient does not smoke and likes to be active   Review of Systems  All other systems reviewed and are negative.       Objective:  Physical Exam  Constitutional: She appears well-developed and well-nourished.  Cardiovascular: Intact distal pulses.  Pulmonary/Chest: Effort normal.  Musculoskeletal: Normal range of motion.  Neurological: She is alert.  Skin: Skin is warm.  Nursing note and vitals reviewed.   Neurovascular status intact muscle strength is adequate range of motion within normal limits with patient found to have moderate lifting of the hallux nail right with probable long-term abnormal growth pattern with an incurvated medial border of the right hallux that is moderately painful when pressed.  No redness or drainage is noted currently and patient has good digital perfusion     Assessment:  Ingrown toenail deformity right hallux medial border with pain with mild deformity of the nailbed itself     Plan:  H&P condition reviewed and discussed the structure of the nail as part of the problem.  At this point will get a focus on the border and today I did explain permanent procedure and risk and patient signed consent form.  I infiltrated the right hallux 60 mg like Marcaine mixture sterile prep applied to the toe sterile instrumentation I remove the medial border exposed matrix and applied phenol 3 applications 30 seconds followed by alcohol lavage and sterile dressing.  Gave instructions on soaks and reappoint and encouraged to call with any questions that may come up postoperatively

## 2018-09-29 MED FILL — ATORVASTATIN 40 MG TABLET: 40 | 90 days supply | Qty: 90 | Fill #1

## 2018-10-06 ENCOUNTER — Ambulatory Visit (INDEPENDENT_AMBULATORY_CARE_PROVIDER_SITE_OTHER): Payer: No Typology Code available for payment source | Admitting: Nurse Practitioner

## 2018-10-06 ENCOUNTER — Encounter: Payer: Self-pay | Admitting: Nurse Practitioner

## 2018-10-06 ENCOUNTER — Telehealth: Payer: Self-pay | Admitting: Podiatry

## 2018-10-06 VITALS — BP 134/80 | HR 66 | Temp 97.0°F | Ht 63.0 in | Wt 177.0 lb

## 2018-10-06 DIAGNOSIS — R7989 Other specified abnormal findings of blood chemistry: Secondary | ICD-10-CM

## 2018-10-06 DIAGNOSIS — E785 Hyperlipidemia, unspecified: Secondary | ICD-10-CM

## 2018-10-06 DIAGNOSIS — M8580 Other specified disorders of bone density and structure, unspecified site: Secondary | ICD-10-CM

## 2018-10-06 DIAGNOSIS — E8881 Metabolic syndrome: Secondary | ICD-10-CM

## 2018-10-06 DIAGNOSIS — Z6828 Body mass index (BMI) 28.0-28.9, adult: Secondary | ICD-10-CM

## 2018-10-06 MED ORDER — CEPHALEXIN 500 MG PO CAPS: 500.0000 mg | ORAL_CAPSULE | Freq: Three times a day (TID) | ORAL | 0 refills | Status: DC

## 2018-10-06 NOTE — Telephone Encounter (Addendum)
Pt states she had noticed dried pus on the right toe and after she soaked pus oozed out and the toe is red and sore. I told pt to continue the soaks and drops and I would inform Dr. Paulla Dolly.

## 2018-10-06 NOTE — Progress Notes (Signed)
Subjective:    Patient ID: Tanya Wilkinson, female    DOB: [birth year], 57 y.o.   MRN: [id number]   Chief Complaint: medical management of chronic issues  HPI:  1. Osteopenia, unspecified location  Last dexascan was done 04/22/17 with t score of -2.3. She is on vitamin d supplement and calcium supplement. Does not do any weight bearing exercises.  2. Hyperlipidemia with target LDL less than 100  Doe snot realyy watch diet.  3. BMI 28.0-28.9,adult  No recent weight changes  4. Low serum vitamin D  Is on vitamin d supplement  5. Metabolic syndrome  Does not check blood sugars at home. Husband is diabetic she she does try to watch carbs in diet.    Outpatient Encounter Medications as of 10/06/2018  Medication Sig  . atorvastatin (LIPITOR) 40 MG tablet Take 1 tablet (40 mg total) by mouth daily.  Marland Kitchen CALCIUM PO Take by mouth.  . cephALEXin (KEFLEX) 500 MG capsule Take 1 capsule (500 mg total) by mouth 2 (two) times daily.  . Cholecalciferol (VITAMIN D) 2000 units CAPS Take by mouth.  . Multiple Vitamins-Minerals (CENTRUM SILVER PO) Take 1 tablet by mouth daily.  . mupirocin ointment (BACTROBAN) 2 % Apply 1 application topically 2 (two) times daily.  Marland Kitchen neomycin-polymyxin-hydrocortisone (CORTISPORIN) OTIC solution Apply 1-2 drops to toe after soaking BID  . omega-3 acid ethyl esters (LOVAZA) 1 g capsule Take 1 capsule (1 g total) by mouth 2 (two) times daily.      New complaints: None today  Social history: Lives with husband. Works for [employer] in [place]: Negative for activity change and appetite change.  HENT: Negative.   Eyes: Negative for pain.  Respiratory: Negative for shortness of breath.   Cardiovascular: Negative for chest pain, palpitations and leg swelling.  Gastrointestinal: Negative for abdominal pain.  Endocrine: Negative for polydipsia.  Genitourinary: Negative.   Skin: Negative for rash.  Neurological: Negative  for dizziness, weakness and headaches.  Hematological: Does not bruise/bleed easily.  Psychiatric/Behavioral: Negative.   All other systems reviewed and are negative.      Objective:   Physical Exam  Constitutional: She is oriented to person, place, and time. She appears well-developed and well-nourished. No distress.  HENT:  Head: Normocephalic.  Nose: Nose normal.  Mouth/Throat: Oropharynx is clear and moist.  Eyes: Pupils are equal, round, and reactive to light. EOM are normal.  Neck: Normal range of motion. Neck supple. No JVD present. Carotid bruit is not present.  Cardiovascular: Normal rate, regular rhythm, normal heart sounds and intact distal pulses.  Pulmonary/Chest: Effort normal and breath sounds normal. No respiratory distress. She has no wheezes. She has no rales. She exhibits no tenderness.  Abdominal: Soft. Normal appearance, normal aorta and bowel sounds are normal. She exhibits no distension, no abdominal bruit, no pulsatile midline mass and no mass. There is no splenomegaly or hepatomegaly. There is no tenderness.  Musculoskeletal: Normal range of motion. She exhibits no edema.  Lymphadenopathy:    She has no cervical adenopathy.  Neurological: She is alert and oriented to person, place, and time. She has normal reflexes.  Skin: Skin is warm and dry.  Psychiatric: She has a normal mood and affect. Her behavior is normal. Judgment and thought content normal.  Nursing note and vitals reviewed.   BP 134/80   Pulse 66   Temp (!) 97 F (36.1 C) (Oral)   Ht '5\' 3"'  (1.6 m)  Wt 177 lb (80.3 kg)   LMP 12/07/2007   BMI 31.35 kg/m        Assessment & Plan:  .Tanya Wilkinson comes in today with chief complaint of Medical Management of Chronic Issues   Diagnosis and orders addressed:  1. Osteopenia, unspecified location Weight bearing exercises  2. Hyperlipidemia with target LDL less than 100 Low fat diet - CMP14+EGFR - Lipid panel  3. BMI  28.0-28.9,adult Discussed diet and exercise for person with BMI >25 Will recheck weight in 3-6 months  4. Low serum vitamin D Take daily vitamin d  5. Metabolic syndrome Watch carbs in diet   Labs pending Health Maintenance reviewed Diet and exercise encouraged  Follow up plan: 6 months   Mary-Margaret Hassell Done, FNP

## 2018-10-06 NOTE — Telephone Encounter (Signed)
Left message informing pt of DR.Regal's orders.

## 2018-10-06 NOTE — Telephone Encounter (Signed)
I had an ingrown toenail procedure on 07 October with Dr. Paulla Dolly. The right side looks infected and it has some pus. I was wondering if you could call in a antibiotic to the [facility]. My number is [phone].

## 2018-10-06 NOTE — Addendum Note (Signed)
Addended by: Harriett Sine D on: 10/06/2018 02:04 PM   Modules accepted: Orders

## 2018-10-06 NOTE — Patient Instructions (Signed)
Exercising to Lose Weight Exercising can help you to lose weight. In order to lose weight through exercise, you need to do vigorous-intensity exercise. You can tell that you are exercising with vigorous intensity if you are breathing very hard and fast and cannot hold a conversation while exercising. Moderate-intensity exercise helps to maintain your current weight. You can tell that you are exercising at a moderate level if you have a higher heart rate and faster breathing, but you are still able to hold a conversation. How often should I exercise? Choose an activity that you enjoy and set realistic goals. Your health care provider can help you to make an activity plan that works for you. Exercise regularly as directed by your health care provider. This may include:  Doing resistance training twice each week, such as: ? Push-ups. ? Sit-ups. ? Lifting weights. ? Using resistance bands.  Doing a given intensity of exercise for a given amount of time. Choose from these options: ? 150 minutes of moderate-intensity exercise every week. ? 75 minutes of vigorous-intensity exercise every week. ? A mix of moderate-intensity and vigorous-intensity exercise every week.  Children, pregnant women, people who are out of shape, people who are overweight, and older adults may need to consult a health care provider for individual recommendations. If you have any sort of medical condition, be sure to consult your health care provider before starting a new exercise program. What are some activities that can help me to lose weight?  Walking at a rate of at least 4.5 miles an hour.  Jogging or running at a rate of 5 miles per hour.  Biking at a rate of at least 10 miles per hour.  Lap swimming.  Roller-skating or in-line skating.  Cross-country skiing.  Vigorous competitive sports, such as football, basketball, and soccer.  Jumping rope.  Aerobic dancing. How can I be more active in my day-to-day  activities?  Use the stairs instead of the elevator.  Take a walk during your lunch break.  If you drive, park your car farther away from work or school.  If you take public transportation, get off one stop early and walk the rest of the way.  Make all of your phone calls while standing up and walking around.  Get up, stretch, and walk around every 30 minutes throughout the day. What guidelines should I follow while exercising?  Do not exercise so much that you hurt yourself, feel dizzy, or get very short of breath.  Consult your health care provider prior to starting a new exercise program.  Wear comfortable clothes and shoes with good support.  Drink plenty of water while you exercise to prevent dehydration or heat stroke. Body water is lost during exercise and must be replaced.  Work out until you breathe faster and your heart beats faster. This information is not intended to replace advice given to you by your health care provider. Make sure you discuss any questions you have with your health care provider. Document Released: 12/25/2010 Document Revised: 04/29/2016 Document Reviewed: 04/25/2014 Elsevier Interactive Patient Education  2018 [employer]  

## 2018-10-07 LAB — CMP14+EGFR
A/G RATIO: 1.6 (ref 1.2–2.2)
ALK PHOS: 107 IU/L (ref 39–117)
ALT: 15 IU/L (ref 0–32)
AST: 18 IU/L (ref 0–40)
Albumin: 4.2 g/dL (ref 3.5–5.5)
BILIRUBIN TOTAL: 0.4 mg/dL (ref 0.0–1.2)
BUN/Creatinine Ratio: 13 (ref 9–23)
BUN: 9 mg/dL (ref 6–24)
CHLORIDE: 103 mmol/L (ref 96–106)
CO2: 26 mmol/L (ref 20–29)
Calcium: 9.7 mg/dL (ref 8.7–10.2)
Creatinine, Ser: 0.7 mg/dL (ref 0.57–1.00)
GFR calc Af Amer: 110 mL/min/{1.73_m2} (ref >59)
GFR, EST NON AFRICAN AMERICAN: 95 mL/min/{1.73_m2} (ref >59)
GLOBULIN, TOTAL: 2.6 g/dL (ref 1.5–4.5)
Glucose: 94 mg/dL (ref 65–99)
POTASSIUM: 4.8 mmol/L (ref 3.5–5.2)
SODIUM: 142 mmol/L (ref 134–144)
Total Protein: 6.8 g/dL (ref 6.0–8.5)

## 2018-10-07 LAB — LIPID PANEL
CHOL/HDL RATIO: 4.7 ratio — AB (ref 0.0–4.4)
Cholesterol, Total: 186 mg/dL (ref 100–199)
HDL: 40 mg/dL (ref >39)
LDL Calculated: 100 mg/dL — ABNORMAL HIGH (ref 0–99)
TRIGLYCERIDES: 232 mg/dL — AB (ref 0–149)
VLDL Cholesterol Cal: 46 mg/dL — ABNORMAL HIGH (ref 5–40)

## 2019-01-15 MED FILL — OMEGA-3 ETHYL ESTERS 1 GM C: 1 | 45 days supply | Qty: 90 | Fill #1

## 2019-01-26 ENCOUNTER — Encounter: Payer: Self-pay | Admitting: Nurse Practitioner

## 2019-01-26 ENCOUNTER — Ambulatory Visit (INDEPENDENT_AMBULATORY_CARE_PROVIDER_SITE_OTHER): Payer: No Typology Code available for payment source | Admitting: Nurse Practitioner

## 2019-01-26 VITALS — BP 136/73 | HR 64 | Temp 97.4°F | Ht 63.0 in | Wt 177.0 lb

## 2019-01-26 DIAGNOSIS — Z6828 Body mass index (BMI) 28.0-28.9, adult: Secondary | ICD-10-CM

## 2019-01-26 DIAGNOSIS — E785 Hyperlipidemia, unspecified: Secondary | ICD-10-CM

## 2019-01-26 DIAGNOSIS — E8881 Metabolic syndrome: Secondary | ICD-10-CM

## 2019-01-26 DIAGNOSIS — M8580 Other specified disorders of bone density and structure, unspecified site: Secondary | ICD-10-CM

## 2019-01-26 DIAGNOSIS — R7989 Other specified abnormal findings of blood chemistry: Secondary | ICD-10-CM | POA: Diagnosis not present

## 2019-01-26 MED ORDER — OMEGA-3-ACID ETHYL ESTERS 1 G PO CAPS: 1.0000 g | ORAL_CAPSULE | Freq: Two times a day (BID) | ORAL | 1 refills | Status: DC

## 2019-01-26 MED ORDER — ATORVASTATIN CALCIUM 40 MG PO TABS: 40.0000 mg | ORAL_TABLET | Freq: Every day | ORAL | 1 refills | Status: DC

## 2019-01-26 NOTE — Progress Notes (Signed)
Subjective:    Patient ID: Tanya Wilkinson, female    DOB: 1961/08/23, 58 y.o.   MRN: [id number]   Chief Complaint: medical management of chronic issues  HPI:  1. Hyperlipidemia with target LDL less than 100  Doe snot really watch diet and does very little exercise.  2. Metabolic syndrome  Has not had HGBA1c done in awhile. Last fasting blood sugar was 94. Sh ewatches crbs. Her husband is diabetic so she tries not to keep junk in the house.  3. Osteopenia, unspecified location  Last dexascan was done 04/25/17 with t score of -2.3. she does no weight bearing exercise.  4. Low serum vitamin D  Takes a daily vitamin d and calcium supplement  5. BMI 28.0-28.9,adult  No recent weight changes    Outpatient Encounter Medications as of 01/26/2019  Medication Sig  . atorvastatin (LIPITOR) 40 MG tablet Take 1 tablet (40 mg total) by mouth daily.  Marland Kitchen CALCIUM PO Take by mouth.  . cephALEXin (KEFLEX) 500 MG capsule Take 1 capsule (500 mg total) by mouth 3 (three) times daily.  . Cholecalciferol (VITAMIN D) 2000 units CAPS Take by mouth.  . Multiple Vitamins-Minerals (CENTRUM SILVER PO) Take 1 tablet by mouth daily.  . mupirocin ointment (BACTROBAN) 2 % Apply 1 application topically 2 (two) times daily.  Marland Kitchen neomycin-polymyxin-hydrocortisone (CORTISPORIN) OTIC solution Apply 1-2 drops to toe after soaking BID  . omega-3 acid ethyl esters (LOVAZA) 1 g capsule Take 1 capsule (1 g total) by mouth 2 (two) times daily.       New complaints: None today  Social history: Works for [employer].   Review of Systems  Constitutional: Negative for activity change and appetite change.  HENT: Negative.   Eyes: Negative for pain.  Respiratory: Negative for shortness of breath.   Cardiovascular: Negative for chest pain, palpitations and leg swelling.  Gastrointestinal: Negative for abdominal pain.  Endocrine: Negative for polydipsia.  Genitourinary: Negative.   Skin: Negative for rash.    Neurological: Negative for dizziness, weakness and headaches.  Hematological: Does not bruise/bleed easily.  Psychiatric/Behavioral: Negative.   All other systems reviewed and are negative.      Objective:   Physical Exam Vitals signs and nursing note reviewed.  Constitutional:      General: She is not in acute distress.    Appearance: Normal appearance. She is well-developed.  HENT:     Head: Normocephalic.     Nose: Nose normal.  Eyes:     Pupils: Pupils are equal, round, and reactive to light.  Neck:     Musculoskeletal: Normal range of motion and neck supple.     Vascular: No carotid bruit or JVD.  Cardiovascular:     Rate and Rhythm: Normal rate and regular rhythm.     Heart sounds: Normal heart sounds.  Pulmonary:     Effort: Pulmonary effort is normal. No respiratory distress.     Breath sounds: Normal breath sounds. No wheezing or rales.  Chest:     Chest wall: No tenderness.  Abdominal:     General: Bowel sounds are normal. There is no distension or abdominal bruit.     Palpations: Abdomen is soft. There is no hepatomegaly, splenomegaly, mass or pulsatile mass.     Tenderness: There is no abdominal tenderness.  Musculoskeletal: Normal range of motion.  Lymphadenopathy:     Cervical: No cervical adenopathy.  Skin:    General: Skin is warm and dry.  Neurological:  Mental Status: She is alert and oriented to person, place, and time.     Deep Tendon Reflexes: Reflexes are normal and symmetric.  Psychiatric:        Behavior: Behavior normal.        Thought Content: Thought content normal.        Judgment: Judgment normal.    BP 136/73   Pulse 64   Temp (!) 97.4 F (36.3 C) (Oral)   Ht '5\' 3"'  (1.6 m)   Wt 177 lb (80.3 kg)   LMP 12/07/2007   BMI 31.35 kg/m         Assessment & Plan:  Tanya Wilkinson comes in today with chief complaint of Medical Management of Chronic Issues   Diagnosis and orders addressed:  1. Hyperlipidemia with target LDL less  than 100 Low fat diet - atorvastatin (LIPITOR) 40 MG tablet; Take 1 tablet (40 mg total) by mouth daily.  Dispense: 90 tablet; Refill: 1 - omega-3 acid ethyl esters (LOVAZA) 1 g capsule; Take 1 capsule (1 g total) by mouth 2 (two) times daily.  Dispense: 90 capsule; Refill: 1 - CMP14+EGFR - Lipid panel  2. Metabolic syndrome Watch carbs in diet  3. Osteopenia, unspecified location Weight bearing exercises  4. Low serum vitamin D Continue daily vitamin d supplement with calcium  5. BMI 28.0-28.9,adult Discussed diet and exercise for person with BMI >25 Will recheck weight in 3-6 months   Labs pending Health Maintenance reviewed Diet and exercise encouraged  Follow up plan: 6 months   Mary-Margaret Hassell Done, FNP

## 2019-01-26 NOTE — Patient Instructions (Signed)

## 2019-01-27 LAB — CMP14+EGFR
ALK PHOS: 122 IU/L — AB (ref 39–117)
ALT: 19 IU/L (ref 0–32)
AST: 20 IU/L (ref 0–40)
Albumin/Globulin Ratio: 2.2 (ref 1.2–2.2)
Albumin: 4.6 g/dL (ref 3.8–4.9)
BUN/Creatinine Ratio: 11 (ref 9–23)
BUN: 9 mg/dL (ref 6–24)
Bilirubin Total: 0.5 mg/dL (ref 0.0–1.2)
CALCIUM: 9.7 mg/dL (ref 8.7–10.2)
CO2: 25 mmol/L (ref 20–29)
CREATININE: 0.8 mg/dL (ref 0.57–1.00)
Chloride: 102 mmol/L (ref 96–106)
GFR calc Af Amer: 93 mL/min/{1.73_m2} (ref >59)
GFR, EST NON AFRICAN AMERICAN: 81 mL/min/{1.73_m2} (ref >59)
GLUCOSE: 96 mg/dL (ref 65–99)
Globulin, Total: 2.1 g/dL (ref 1.5–4.5)
Potassium: 5 mmol/L (ref 3.5–5.2)
Sodium: 143 mmol/L (ref 134–144)
Total Protein: 6.7 g/dL (ref 6.0–8.5)

## 2019-01-27 LAB — LIPID PANEL
CHOL/HDL RATIO: 3.4 ratio (ref 0.0–4.4)
CHOLESTEROL TOTAL: 159 mg/dL (ref 100–199)
HDL: 47 mg/dL (ref >39)
LDL CALC: 93 mg/dL (ref 0–99)
TRIGLYCERIDES: 95 mg/dL (ref 0–149)
VLDL CHOLESTEROL CAL: 19 mg/dL (ref 5–40)

## 2019-02-03 MED FILL — ATORVASTATIN 40 MG TABLET: 40 | 90 days supply | Qty: 90 | Fill #0

## 2019-05-19 ENCOUNTER — Other Ambulatory Visit: Payer: Self-pay | Admitting: Nurse Practitioner

## 2019-05-19 DIAGNOSIS — E785 Hyperlipidemia, unspecified: Secondary | ICD-10-CM

## 2019-05-19 MED FILL — ATORVASTATIN 40 MG TABLET: 40 | 90 days supply | Qty: 90 | Fill #1

## 2019-05-21 MED FILL — OMEGA-3 ETHYL ESTERS 1 GM C: 1 | 45 days supply | Qty: 90 | Fill #0

## 2019-08-28 ENCOUNTER — Encounter: Payer: Self-pay | Admitting: *Deleted

## 2019-08-28 ENCOUNTER — Encounter: Payer: Self-pay | Admitting: Nurse Practitioner

## 2019-08-28 ENCOUNTER — Ambulatory Visit (INDEPENDENT_AMBULATORY_CARE_PROVIDER_SITE_OTHER): Payer: No Typology Code available for payment source | Admitting: Nurse Practitioner

## 2019-08-28 ENCOUNTER — Other Ambulatory Visit: Payer: Self-pay

## 2019-08-28 VITALS — BP 142/88 | HR 66 | Temp 98.0°F | Ht 63.0 in | Wt 175.0 lb

## 2019-08-28 DIAGNOSIS — Z23 Encounter for immunization: Secondary | ICD-10-CM | POA: Diagnosis not present

## 2019-08-28 DIAGNOSIS — E8881 Metabolic syndrome: Secondary | ICD-10-CM

## 2019-08-28 DIAGNOSIS — Z6828 Body mass index (BMI) 28.0-28.9, adult: Secondary | ICD-10-CM

## 2019-08-28 DIAGNOSIS — M8580 Other specified disorders of bone density and structure, unspecified site: Secondary | ICD-10-CM

## 2019-08-28 DIAGNOSIS — I10 Essential (primary) hypertension: Secondary | ICD-10-CM

## 2019-08-28 DIAGNOSIS — R7989 Other specified abnormal findings of blood chemistry: Secondary | ICD-10-CM | POA: Diagnosis not present

## 2019-08-28 DIAGNOSIS — E785 Hyperlipidemia, unspecified: Secondary | ICD-10-CM | POA: Diagnosis not present

## 2019-08-28 LAB — BAYER DCA HB A1C WAIVED: HB A1C (BAYER DCA - WAIVED): 5.7 % (ref <7.0)

## 2019-08-28 MED ORDER — ATORVASTATIN CALCIUM 40 MG PO TABS: 40.0000 mg | ORAL_TABLET | Freq: Every day | ORAL | 1 refills | Status: DC

## 2019-08-28 MED ORDER — OMEGA-3-ACID ETHYL ESTERS 1 G PO CAPS: 1.0000 g | ORAL_CAPSULE | Freq: Two times a day (BID) | ORAL | 1 refills | Status: DC

## 2019-08-28 MED ORDER — HYDROCHLOROTHIAZIDE 25 MG PO TABS: 25.0000 mg | ORAL_TABLET | Freq: Every day | ORAL | 1 refills | Status: DC

## 2019-08-28 MED FILL — ATORVASTATIN 40 MG TABLET: 40 | 90 days supply | Qty: 90 | Fill #0

## 2019-08-28 MED FILL — HYDROCHLOROTHIAZIDE 25 MG T: 25 | 90 days supply | Qty: 90 | Fill #0

## 2019-08-28 MED FILL — OMEGA-3 ETHYL ESTERS 1 GM C: 1 | 45 days supply | Qty: 90 | Fill #0

## 2019-08-28 NOTE — Addendum Note (Signed)
Addended by: Chevis Pretty on: 08/28/2019 11:37 AM   Modules accepted: Orders

## 2019-08-28 NOTE — Progress Notes (Signed)
Subjective:    Patient ID: Tanya Wilkinson, female    DOB: March 25, 1961, 58 y.o.   MRN: [id number]   Chief Complaint: Medical Management of Chronic Issues    HPI:  1. Hyperlipidemia with target LDL less than 100 Tires to watch diet but does very little exercise. Lab Results  Component Value Date   CHOL 159 01/26/2019   HDL 47 01/26/2019   LDLCALC 93 01/26/2019   TRIG 95 01/26/2019   CHOLHDL 3.4 01/26/2019     2. Metabolic syndrome Tries to watch carbs in diet. Does not check blood sugars at home. Lab Results  Component Value Date   HGBA1C 5.8 (H) 11/09/2013     3. Low serum vitamin D Takes daily vitamin d suplement  4. Osteopenia, unspecified location Last dexascan was done 09/04/14 with t score of -1.9. We will repeat when can  5. BMI 28.0-28.9,adult No recent weight changes Wt Readings from Last 3 Encounters:  08/28/19 175 lb (79.4 kg)  01/26/19 177 lb (80.3 kg)  10/06/18 177 lb (80.3 kg)   BMI Readings from Last 3 Encounters:  08/28/19 31.00 kg/m  01/26/19 31.35 kg/m  10/06/18 31.35 kg/m       Outpatient Encounter Medications as of 08/28/2019  Medication Sig  . atorvastatin (LIPITOR) 40 MG tablet Take 1 tablet (40 mg total) by mouth daily.  Marland Kitchen CALCIUM PO Take by mouth.  . Cholecalciferol (VITAMIN D) 2000 units CAPS Take by mouth.  . Multiple Vitamins-Minerals (CENTRUM SILVER PO) Take 1 tablet by mouth daily.  Marland Kitchen omega-3 acid ethyl esters (LOVAZA) 1 g capsule TAKE 1 CAPSULE (1 G TOTAL) BY MOUTH 2 (TWO) TIMES DAILY.     Past Surgical History:  Procedure Laterality Date  . ANTERIOR CERVICAL DECOMP/DISCECTOMY FUSION  08/2006  . CESAREAN SECTION  [id number]  . COLONOSCOPY  [facility] in [place]   . TUBAL LIGATION  [id number]   BTL    Family History  Problem Relation Age of Onset  . Diabetes Mother   . Hypertension Mother   . Kidney disease Mother   . Cancer Father        throat & lung-smoker  . Stroke Father   . Stroke Brother   .  Hypertension Brother   . Cancer Brother 69       bladder  . Stroke Brother   . Early death Brother 71  . Stroke Son   . Hypertension Son   . Colon cancer Neg Hx   . Pancreatic cancer Neg Hx   . Stomach cancer Neg Hx   . Esophageal cancer Neg Hx   . 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  Controlled substance contract: n/a    Review of Systems  Constitutional: Negative for activity change and appetite change.  HENT: Negative.   Eyes: Negative for pain.  Respiratory: Negative for shortness of breath.   Cardiovascular: Negative for chest pain, palpitations and leg swelling.  Gastrointestinal: Negative for abdominal pain.  Endocrine: Negative for polydipsia.  Genitourinary: Negative.   Skin: Negative for rash.  Neurological: Negative for dizziness, weakness and headaches.  Hematological: Does not bruise/bleed easily.  Psychiatric/Behavioral: Negative.   All other systems reviewed and are negative.      Objective:   Physical Exam Vitals signs and nursing note reviewed.  Constitutional:      General: She is not in acute distress.    Appearance: Normal appearance.  She is well-developed.  HENT:     Head: Normocephalic.     Nose: Nose normal.  Eyes:     Pupils: Pupils are equal, round, and reactive to light.  Neck:     Musculoskeletal: Normal range of motion and neck supple.     Vascular: No carotid bruit or JVD.  Cardiovascular:     Rate and Rhythm: Normal rate and regular rhythm.     Heart sounds: Normal heart sounds.  Pulmonary:     Effort: Pulmonary effort is normal. No respiratory distress.     Breath sounds: Normal breath sounds. No wheezing or rales.  Chest:     Chest wall: No tenderness.  Abdominal:     General: Bowel sounds are normal. There is no distension or abdominal bruit.     Palpations: Abdomen is soft. There is no hepatomegaly, splenomegaly, mass or pulsatile mass.     Tenderness: There is  no abdominal tenderness.  Musculoskeletal: Normal range of motion.  Lymphadenopathy:     Cervical: No cervical adenopathy.  Skin:    General: Skin is warm and dry.  Neurological:     Mental Status: She is alert and oriented to person, place, and time.     Deep Tendon Reflexes: Reflexes are normal and symmetric.  Psychiatric:        Behavior: Behavior normal.        Thought Content: Thought content normal.        Judgment: Judgment normal.     BP (!) 142/88 (BP Location: Left Arm, Cuff Size: Normal)   Pulse 66   Temp 98 F (36.7 C) (Temporal)   Ht 5\' 3"  (1.6 m)   Wt 175 lb (79.4 kg)   LMP 12/07/2007   SpO2 99%   BMI 31.00 kg/m         Assessment & Plan:  Tanya Wilkinson comes in today with chief complaint of Medical Management of Chronic Issues   Diagnosis and orders addressed:  1. Hyperlipidemia with target LDL less than 100 Low fat diet - omega-3 acid ethyl esters (LOVAZA) 1 g capsule; Take 1 capsule (1 g total) by mouth 2 (two) times daily.  Dispense: 90 capsule; Refill: 1 - atorvastatin (LIPITOR) 40 MG tablet; Take 1 tablet (40 mg total) by mouth daily.  Dispense: 90 tablet; Refill: 1  2. Metabolic syndrome Continue to watch carbs in diet  3. Low serum vitamin D Continue daily vitamin d   4. Osteopenia, unspecified location Weight bearing exercise. Will schedule for dexascan  5. BMI 28.0-28.9,adult Discussed diet and exercise for person with BMI >25 Will recheck weight in 3-6 months  6. Essential hypertension Low sodium diet - hydrochlorothiazide (HYDRODIURIL) 25 MG tablet; Take 1 tablet (25 mg total) by mouth daily.  Dispense: 90 tablet; Refill: 1   Labs pending Health Maintenance reviewed Diet and exercise encouraged  Follow up plan: 6 months   Mary-Margaret Hassell Done, FNP

## 2019-08-28 NOTE — Patient Instructions (Signed)

## 2019-08-29 LAB — CMP14+EGFR
ALT: 18 IU/L (ref 0–32)
AST: 22 IU/L (ref 0–40)
Albumin/Globulin Ratio: 1.8 (ref 1.2–2.2)
Albumin: 4.3 g/dL (ref 3.8–4.9)
Alkaline Phosphatase: 132 IU/L — ABNORMAL HIGH (ref 39–117)
BUN/Creatinine Ratio: 11 — ABNORMAL LOW (ref 12–28)
BUN: 8 mg/dL (ref 8–27)
Bilirubin Total: 0.4 mg/dL (ref 0.0–1.2)
CO2: 25 mmol/L (ref 20–29)
Calcium: 9.6 mg/dL (ref 8.7–10.3)
Chloride: 103 mmol/L (ref 96–106)
Creatinine, Ser: 0.74 mg/dL (ref 0.57–1.00)
GFR calc Af Amer: 102 mL/min/{1.73_m2} (ref >59)
GFR calc non Af Amer: 88 mL/min/{1.73_m2} (ref >59)
Globulin, Total: 2.4 g/dL (ref 1.5–4.5)
Glucose: 105 mg/dL — ABNORMAL HIGH (ref 65–99)
Potassium: 4.8 mmol/L (ref 3.5–5.2)
Sodium: 142 mmol/L (ref 134–144)
Total Protein: 6.7 g/dL (ref 6.0–8.5)

## 2019-08-29 LAB — LIPID PANEL
Chol/HDL Ratio: 4.1 ratio (ref 0.0–4.4)
Cholesterol, Total: 168 mg/dL (ref 100–199)
HDL: 41 mg/dL (ref >39)
LDL Chol Calc (NIH): 96 mg/dL (ref 0–99)
Triglycerides: 182 mg/dL — ABNORMAL HIGH (ref 0–149)
VLDL Cholesterol Cal: 31 mg/dL (ref 5–40)

## 2019-09-03 ENCOUNTER — Ambulatory Visit: Payer: No Typology Code available for payment source | Admitting: Nurse Practitioner

## 2019-10-24 ENCOUNTER — Other Ambulatory Visit: Payer: Self-pay

## 2019-10-26 ENCOUNTER — Other Ambulatory Visit (HOSPITAL_COMMUNITY)
Admission: RE | Admit: 2019-10-26 | Discharge: 2019-10-26 | Disposition: A | Discharge status: Home / Self Care | Payer: No Typology Code available for payment source | Source: Ambulatory Visit | Attending: Obstetrics & Gynecology | Admitting: Obstetrics & Gynecology

## 2019-10-26 ENCOUNTER — Encounter: Payer: Self-pay | Admitting: Certified Nurse Midwife

## 2019-10-26 ENCOUNTER — Telehealth: Payer: Self-pay | Admitting: *Deleted

## 2019-10-26 ENCOUNTER — Other Ambulatory Visit: Payer: Self-pay

## 2019-10-26 ENCOUNTER — Ambulatory Visit (INDEPENDENT_AMBULATORY_CARE_PROVIDER_SITE_OTHER): Payer: No Typology Code available for payment source | Admitting: Certified Nurse Midwife

## 2019-10-26 VITALS — BP 120/70 | HR 68 | Temp 97.1°F | Resp 16 | Ht 62.75 in | Wt 168.0 lb

## 2019-10-26 DIAGNOSIS — N8110 Cystocele, unspecified: Secondary | ICD-10-CM

## 2019-10-26 DIAGNOSIS — Z124 Encounter for screening for malignant neoplasm of cervix: Secondary | ICD-10-CM

## 2019-10-26 DIAGNOSIS — N898 Other specified noninflammatory disorders of vagina: Secondary | ICD-10-CM

## 2019-10-26 DIAGNOSIS — N6310 Unspecified lump in the right breast, unspecified quadrant: Secondary | ICD-10-CM | POA: Diagnosis not present

## 2019-10-26 DIAGNOSIS — N951 Menopausal and female climacteric states: Secondary | ICD-10-CM | POA: Diagnosis not present

## 2019-10-26 DIAGNOSIS — Z01411 Encounter for gynecological examination (general) (routine) with abnormal findings: Secondary | ICD-10-CM

## 2019-10-26 NOTE — Telephone Encounter (Signed)
Last screening MMG 08/22/18.   Call placed to [facility], spoke with Benjamine Mola. Bilateral Dx MMG and right breast US, if needed, scheduled for 11/06/19 at 8:40am, arrive at 8:20am.   Spoke with patient, advised of appt date and time. Patient verbalizes understanding and is agreeable.   Patient placed in MMG hold.   Routing to provider for final review. Patient is agreeable to disposition. Will close encounter.

## 2019-10-26 NOTE — Telephone Encounter (Signed)
-----   Message from Regina Eck, CNM sent at 10/26/2019  9:02 AM EST ----- Patient needs diagnostic mammogram and [place] due to mass in right breast, possible enlarged lymph node see note

## 2019-10-26 NOTE — Patient Instructions (Signed)

## 2019-10-26 NOTE — Progress Notes (Signed)
58 y.o. [id number] Married  Caucasian Fe here for annual exam. Menopausal no vaginal bleeding or vaginal dryness. Working in [employer] and now walking daily or work in fitness room. Sees Chevis Pretty for hypertension, cholesterol, Vitamin D and Osteopenia management, all stable per patient. New grandchild in the past 3 weeks, all doing well! No health issues today.  Patient's last menstrual period was 12/07/2007.          Sexually active: Yes.    The current method of family planning is tubal ligation & post menopausal. Exercising: Yes.    walking Smoker:  no  Review of Systems  Constitutional: Negative.   HENT: Negative.   Eyes: Negative.   Respiratory: Negative.   Cardiovascular: Negative.   Gastrointestinal: Negative.   Genitourinary: Negative.   Musculoskeletal: Negative.   Skin: Negative.   Neurological: Negative.   Endo/Heme/Allergies: Negative.   Psychiatric/Behavioral: Negative.     Health Maintenance: Pap:  07-08-16 neg, 07-19-18 neg HPV HR neg History of Abnormal Pap: no MMG:  08-21-18 category c density birads 1:neg Self Breast exams: yes Colonoscopy:  2015 polyp f/u 44yrs BMD:   2018 osteopenia TDaP:  2018 Shingles: no Pneumonia: no Hep C and HIV: hep c neg 2016 Labs: if needed   reports that she quit smoking about 24 years ago. She has never used smokeless tobacco. She reports that she does not drink alcohol or use drugs.  Past Medical History:  Diagnosis Date  . Herniated disc   . Hyperlipidemia   . Osteopenia   . Vitamin D deficiency     Past Surgical History:  Procedure Laterality Date  . ANTERIOR CERVICAL DECOMP/DISCECTOMY FUSION  08/2006  . CESAREAN SECTION  [id number]  . COLONOSCOPY  [facility] in [place]   . TUBAL LIGATION  [id number]   BTL    Current Outpatient Medications  Medication Sig Dispense Refill  . atorvastatin (LIPITOR) 40 MG tablet Take 1 tablet (40 mg total) by mouth daily. 90 tablet 1  . CALCIUM PO Take by mouth.    .  Cholecalciferol (VITAMIN D) 2000 units CAPS Take by mouth.    . hydrochlorothiazide (HYDRODIURIL) 25 MG tablet Take 1 tablet (25 mg total) by mouth daily. 90 tablet 1  . Multiple Vitamins-Minerals (CENTRUM SILVER PO) Take 1 tablet by mouth daily.    Marland Kitchen omega-3 acid ethyl esters (LOVAZA) 1 g capsule Take 1 capsule (1 g total) by mouth 2 (two) times daily. 90 capsule 1   No current facility-administered medications for this visit.     Family History  Problem Relation Age of Onset  . Diabetes Mother   . Hypertension Mother   . Kidney disease Mother   . Cancer Father        throat & lung-smoker  . Stroke Father   . Stroke Brother   . Hypertension Brother   . Cancer Brother 5       bladder  . Stroke Brother   . Early death Brother 42  . Stroke Son   . Hypertension Son   . Colon cancer Neg Hx   . Pancreatic cancer Neg Hx   . Stomach cancer Neg Hx   . Esophageal cancer Neg Hx   . Breast cancer Neg Hx     ROS:  Pertinent items are noted in HPI.  Otherwise, a comprehensive ROS was negative.  Exam:   LMP 12/07/2007    Ht Readings from Last 3 Encounters:  08/28/19 5\' 3"  (1.6 m)  01/26/19 5\' 3"  (1.6 m)  10/06/18 5\' 3"  (1.6 m)    General appearance: alert, cooperative and appears stated age Head: Normocephalic, without obvious abnormality, atraumatic Neck: no adenopathy, supple, symmetrical, trachea midline and thyroid normal to inspection and palpation Lungs: clear to auscultation bilaterally Breasts: normal appearance, no masses or tenderness, No nipple retraction or dimpling, No nipple discharge or bleeding, No axillary or supraclavicular adenopathy, right breast mass at outer edge at 8 o'clock bean shape feel, also in lymph node area, ? lymph node enlargement Non tender, mobile Heart: regular rate and rhythm Abdomen: soft, non-tender; no masses,  no organomegaly Extremities: extremities normal, atraumatic, no cyanosis or edema Skin: Skin color, texture, turgor normal. No rashes  or lesions Lymph nodes: Cervical, supraclavicular, and axillary nodes normal. No abnormal inguinal nodes palpated Neurologic: Grossly normal   Pelvic: External genitalia:  no lesions              Urethra:  normal appearing urethra with no masses, tenderness or lesions              Bartholin's and Skene's: normal                 Vagina: normal appearing vagina with normal color and scant moisture and  discharge, no lesions, cystocele grade 2(known), no change, kegel tone fair              Cervix: no cervical motion tenderness, no lesions and normal appearance              Pap taken: Yes.   Bimanual Exam:  Uterus:  normal size, contour, position, consistency, mobility, non-tender and anteverted              Adnexa: normal adnexa and no mass, fullness, tenderness               Rectovaginal: Confirms               Anus:  normal sphincter tone, no lesions  Chaperone present: yes  A:  Well Woman with normal exam  Post menopausal no HRT  Right breast mass  Vaginal dryness  Cystocele grade 2 with fair kegel tone, not symptomatic  Hypertension, Osteopenia, cholesterol, Vitamin D with PCP management  Mammogram due  Aware colonoscopy is due  P:   Reviewed health and wellness pertinent to exam  Aware of need to advise if vaginal bleeding.  Discussed right breast mass finding ? Possible lymph node and need to evaluate. Mammogram due, but will need diagnostic with [place] to evaluate. She will be called with appointment.  Discussed importance of vaginal moisture for healthy prevention of UTI.  Discussed working on kegel exercises to prevent further change. Patient will advise if she feels this has increased.  Continue follow up with PCP as indicated  Stress follow up with colonoscopy due to previous history of polyp. Patient will schedule.  Pap smear: yes  counseled on breast self exam, mammography screening, feminine hygiene, menopause, osteoporosis, adequate intake of calcium and vitamin D, diet  and exercise  return annually or prn  An After Visit Summary was printed and given to the patient.

## 2019-10-29 LAB — CYTOLOGY - PAP: Diagnosis: NEGATIVE

## 2019-11-06 ENCOUNTER — Other Ambulatory Visit: Payer: Self-pay

## 2019-11-06 ENCOUNTER — Ambulatory Visit
Admission: RE | Admit: 2019-11-06 | Discharge: 2019-11-06 | Disposition: A | Discharge status: Home / Self Care | Payer: No Typology Code available for payment source | Source: Ambulatory Visit | Attending: Certified Nurse Midwife | Admitting: Certified Nurse Midwife

## 2019-11-06 DIAGNOSIS — N6310 Unspecified lump in the right breast, unspecified quadrant: Secondary | ICD-10-CM

## 2019-11-07 ENCOUNTER — Telehealth: Payer: Self-pay | Admitting: *Deleted

## 2019-11-07 NOTE — Telephone Encounter (Signed)
Notes recorded by Burnice Logan, RN on 11/07/2019 at 8:27 AM EST  Left message to call Sharee Pimple, RN at [facility].

## 2019-11-07 NOTE — Telephone Encounter (Signed)
Spoke with patient. Advised as seen below per Melvia Heaps, CNM. Patient declines to schedule f/u at this time, will have to review her work schedule and return call. Patient verbalizes understanding.

## 2019-11-07 NOTE — Telephone Encounter (Signed)
-----   Message from Regina Eck, CNM sent at 11/06/2019 11:55 AM EST ----- Reviewed mammogram and [place] finding, nothing seen, patient needs to come back in for recheck of lymph nodes

## 2019-11-08 NOTE — Telephone Encounter (Signed)
Spoke with patient, OV scheduled for 11/12/19 at 4pm with Melvia Heaps, CNM. Patient verbalizes understanding and is agreeable.   OK to remove from MMG hold?   Routing to [employer], CNM FYI.   Cc: Dr. Sabra Heck

## 2019-11-09 NOTE — Progress Notes (Deleted)
ROS  58 y.o. Married Caucasian female [phone] here for follow up of ****** treated with ******* initiated on {DATE MONTH DAY [id number]. Completed all medication as directed.  Denies any symptoms of**************************.    O: Healthy WD,WN female Affect:  Skin: Abdomen:{Exam; abdomen brief:12273} Pelvic exam:{pe pelvic exam prenatal obgyn:314539}  A:****** Resolved     P: Discussed findings of   Labs   Instructions given regarding:  RV

## 2019-11-09 NOTE — Telephone Encounter (Signed)
Yes, ok to remove from MMG hold.

## 2019-11-09 NOTE — Telephone Encounter (Signed)
Patient removed from MMG hold.   Encounter closed.  

## 2019-11-12 ENCOUNTER — Encounter: Payer: Self-pay | Admitting: Certified Nurse Midwife

## 2019-11-12 ENCOUNTER — Ambulatory Visit (INDEPENDENT_AMBULATORY_CARE_PROVIDER_SITE_OTHER): Payer: No Typology Code available for payment source | Admitting: Certified Nurse Midwife

## 2019-11-12 VITALS — Wt 173.0 lb

## 2019-11-12 DIAGNOSIS — Z0000 Encounter for general adult medical examination without abnormal findings: Secondary | ICD-10-CM | POA: Diagnosis not present

## 2019-11-12 NOTE — Progress Notes (Signed)
   Subjective:   58 y.o. Married Caucasian female presents for follow of questionable right breast mass and enlarged lymph node on right. Diagnostic mammogram and US showed no enlarged right axillary lymph node or right breast  mass. Patient is not able to feel what I had her feel when here for her aex now. She had shaved prior to visit with deodorant use. Patient doing SBE and has noted no other changes.  Review of Systems Pertinent items noted in HPI and remainder of comprehensive ROS otherwise negative.   Objective:   General appearance: alert, cooperative, appears stated age and no distress Breasts: normal appearance, no masses or tenderness, No nipple retraction or dimpling, No nipple discharge or bleeding, No axillary or supraclavicular adenopathy, noted in the area of previous concern today.   Assessment:   ASSESSMENT:Patient is diagnosed with normal breast exam and fibroglandular tissue. Enlarged lymph previously found due to shaving and deodorant use., has resolved.   Plan:   PLAN: Discussed finding with patient and she also felt area and agreed. Continue SBE and yearly mammogram. Questions addressed.  Rv prn

## 2019-11-12 NOTE — Patient Instructions (Signed)

## 2019-12-22 ENCOUNTER — Encounter: Payer: Self-pay | Admitting: Nurse Practitioner

## 2019-12-27 MED FILL — ATORVASTATIN 40 MG TABLET: 40 | 90 days supply | Qty: 90 | Fill #1

## 2019-12-27 MED FILL — HYDROCHLOROTHIAZIDE 25 MG T: 25 | 90 days supply | Qty: 90 | Fill #1

## 2019-12-27 MED FILL — OMEGA-3 ETHYL ESTERS 1 GM C: 1 | 45 days supply | Qty: 90 | Fill #1

## 2020-01-08 ENCOUNTER — Other Ambulatory Visit: Payer: Self-pay

## 2020-01-08 DIAGNOSIS — Z780 Asymptomatic menopausal state: Secondary | ICD-10-CM

## 2020-01-08 DIAGNOSIS — Z0000 Encounter for general adult medical examination without abnormal findings: Secondary | ICD-10-CM

## 2020-01-08 DIAGNOSIS — R7989 Other specified abnormal findings of blood chemistry: Secondary | ICD-10-CM

## 2020-01-08 DIAGNOSIS — E559 Vitamin D deficiency, unspecified: Secondary | ICD-10-CM

## 2020-01-08 DIAGNOSIS — M8580 Other specified disorders of bone density and structure, unspecified site: Secondary | ICD-10-CM

## 2020-01-10 ENCOUNTER — Other Ambulatory Visit: Payer: Self-pay

## 2020-01-10 ENCOUNTER — Ambulatory Visit (INDEPENDENT_AMBULATORY_CARE_PROVIDER_SITE_OTHER): Payer: No Typology Code available for payment source

## 2020-01-10 DIAGNOSIS — M8580 Other specified disorders of bone density and structure, unspecified site: Secondary | ICD-10-CM | POA: Diagnosis not present

## 2020-01-10 DIAGNOSIS — Z0000 Encounter for general adult medical examination without abnormal findings: Secondary | ICD-10-CM

## 2020-01-10 DIAGNOSIS — Z780 Asymptomatic menopausal state: Secondary | ICD-10-CM

## 2020-01-10 DIAGNOSIS — E559 Vitamin D deficiency, unspecified: Secondary | ICD-10-CM

## 2020-01-10 DIAGNOSIS — R7989 Other specified abnormal findings of blood chemistry: Secondary | ICD-10-CM | POA: Diagnosis not present

## 2020-01-23 ENCOUNTER — Other Ambulatory Visit: Payer: No Typology Code available for payment source

## 2020-02-06 ENCOUNTER — Encounter: Payer: Self-pay | Admitting: Nurse Practitioner

## 2020-02-06 MED ORDER — BLOOD PRESSURE KIT DEVI: 1.0000 | Freq: Every day | 0 refills | Status: DC

## 2020-02-06 MED FILL — OMRON 3 SERIES BP MONITOR D: 30 days supply | Qty: 1 | Fill #0

## 2020-02-26 ENCOUNTER — Other Ambulatory Visit: Payer: Self-pay

## 2020-02-26 ENCOUNTER — Ambulatory Visit (INDEPENDENT_AMBULATORY_CARE_PROVIDER_SITE_OTHER): Payer: No Typology Code available for payment source | Admitting: Nurse Practitioner

## 2020-02-26 ENCOUNTER — Other Ambulatory Visit: Payer: Self-pay | Admitting: Nurse Practitioner

## 2020-02-26 ENCOUNTER — Encounter: Payer: Self-pay | Admitting: Certified Nurse Midwife

## 2020-02-26 ENCOUNTER — Encounter: Payer: Self-pay | Admitting: Nurse Practitioner

## 2020-02-26 VITALS — BP 135/79 | HR 87 | Temp 97.8°F | Resp 20 | Ht 62.0 in | Wt 172.0 lb

## 2020-02-26 DIAGNOSIS — E8881 Metabolic syndrome: Secondary | ICD-10-CM

## 2020-02-26 DIAGNOSIS — R7989 Other specified abnormal findings of blood chemistry: Secondary | ICD-10-CM

## 2020-02-26 DIAGNOSIS — E785 Hyperlipidemia, unspecified: Secondary | ICD-10-CM

## 2020-02-26 DIAGNOSIS — M8580 Other specified disorders of bone density and structure, unspecified site: Secondary | ICD-10-CM | POA: Diagnosis not present

## 2020-02-26 DIAGNOSIS — I10 Essential (primary) hypertension: Secondary | ICD-10-CM

## 2020-02-26 DIAGNOSIS — Z6828 Body mass index (BMI) 28.0-28.9, adult: Secondary | ICD-10-CM

## 2020-02-26 MED ORDER — ATORVASTATIN CALCIUM 40 MG PO TABS: 40.0000 mg | ORAL_TABLET | Freq: Every day | ORAL | 1 refills | Status: DC

## 2020-02-26 MED ORDER — HYDROCHLOROTHIAZIDE 25 MG PO TABS: 25.0000 mg | ORAL_TABLET | Freq: Every day | ORAL | 1 refills | Status: DC

## 2020-02-26 MED ORDER — OMEGA-3-ACID ETHYL ESTERS 1 G PO CAPS: 1.0000 g | ORAL_CAPSULE | Freq: Two times a day (BID) | ORAL | 1 refills | Status: DC

## 2020-02-26 MED FILL — OMEGA-3 ETHYL ESTERS 1 GM C: 1 | 45 days supply | Qty: 90 | Fill #0

## 2020-02-26 NOTE — Progress Notes (Signed)
Subjective:    Patient ID: Tanya Wilkinson, female    DOB: 1961/07/26, 59 y.o.   MRN: [id number]   Chief Complaint: Medical Management of Chronic Issues    HPI:  1. Hyperlipidemia with target LDL less than 100 Watching diet and tries to exercise. BP Readings from Last 3 Encounters:  02/26/20 135/79  10/26/19 120/70  08/28/19 (!) 142/88     2. Metabolic syndrome Doe snot check blood sugars at home. Lab Results  Component Value Date   HGBA1C 5.7 08/28/2019     3. Low serum vitamin D Takes vitamin supplement daily  4. Osteopenia, unspecified location Had dexascan 1 month ago and had osteopenia- no change from previous  5. BMI 28.0-28.9,adult No recent weight changes Wt Readings from Last 3 Encounters:  02/26/20 172 lb (78 kg)  11/12/19 173 lb (78.5 kg)  10/26/19 168 lb (76.2 kg)   BMI Readings from Last 3 Encounters:  02/26/20 31.46 kg/m  11/12/19 30.89 kg/m  10/26/19 30.00 kg/m   6. hypertension HCTz 45m daily- deneis any side effects. No chest pain, sob or headache. Does check blood pressure at home and s always normal. BP Readings from Last 3 Encounters:  02/26/20 135/79  10/26/19 120/70  08/28/19 (!) 142/88      Outpatient Encounter Medications as of 02/26/2020  Medication Sig  . atorvastatin (LIPITOR) 40 MG tablet Take 1 tablet (40 mg total) by mouth daily.  . Blood Pressure Monitoring (BLOOD PRESSURE KIT) DEVI 1 Device by Does not apply route daily. Check Bp daily and as needed DX I10  . CALCIUM PO Take by mouth.  . Cholecalciferol (VITAMIN D) 2000 units CAPS Take by mouth.  . hydrochlorothiazide (HYDRODIURIL) 25 MG tablet Take 1 tablet (25 mg total) by mouth daily.  . Multiple Vitamins-Minerals (CENTRUM SILVER PO) Take 1 tablet by mouth daily.  .Marland Kitchenomega-3 acid ethyl esters (LOVAZA) 1 g capsule Take 1 capsule (1 g total) by mouth 2 (two) times daily.     Past Surgical History:  Procedure Laterality Date  . ANTERIOR CERVICAL DECOMP/DISCECTOMY  FUSION  08/2006  . CESAREAN SECTION  [id number] . [facility] [place]  . TUBAL LIGATION  14098,[id number]  BTL    Family History  Problem Relation Age of Onset  . Diabetes Mother   . Hypertension Mother   . Kidney disease Mother   . Cancer Father        throat & lung-smoker  . Stroke Father   . Stroke Brother   . Hypertension Brother   . Cancer Brother 518      bladder  . Stroke Brother   . Early death Brother 925 . Stroke Son   . Hypertension Son   . Colon cancer Neg Hx   . Pancreatic cancer Neg Hx   . Stomach cancer Neg Hx   . Esophageal cancer Neg Hx   . Breast cancer Neg Hx     New complaints: None today  Social history: Lives with husband  Controlled substance contract: n/a    Review of Systems  Constitutional: Negative for diaphoresis.  Eyes: Negative for pain.  Respiratory: Negative for shortness of breath.   Cardiovascular: Negative for chest pain, palpitations and leg swelling.  Gastrointestinal: Negative for abdominal pain.  Endocrine: Negative for polydipsia.  Skin: Negative for rash.  Neurological: Negative for dizziness, weakness and headaches.  Hematological: Does not bruise/bleed easily.  All other systems reviewed and are negative.  Objective:   Physical Exam Vitals and nursing note reviewed.  Constitutional:      General: She is not in acute distress.    Appearance: Normal appearance. She is well-developed.  HENT:     Head: Normocephalic.     Nose: Nose normal.  Eyes:     Pupils: Pupils are equal, round, and reactive to light.  Neck:     Vascular: No carotid bruit or JVD.  Cardiovascular:     Rate and Rhythm: Normal rate and regular rhythm.     Heart sounds: Normal heart sounds.  Pulmonary:     Effort: Pulmonary effort is normal. No respiratory distress.     Breath sounds: Normal breath sounds. No wheezing or rales.  Chest:     Chest wall: No tenderness.  Abdominal:     General: Bowel sounds are normal.  There is no distension or abdominal bruit.     Palpations: Abdomen is soft. There is no hepatomegaly, splenomegaly, mass or pulsatile mass.     Tenderness: There is no abdominal tenderness.  Musculoskeletal:        General: Normal range of motion.     Cervical back: Normal range of motion and neck supple.  Lymphadenopathy:     Cervical: No cervical adenopathy.  Skin:    General: Skin is warm and dry.  Neurological:     Mental Status: She is alert and oriented to person, place, and time.     Deep Tendon Reflexes: Reflexes are normal and symmetric.  Psychiatric:        Behavior: Behavior normal.        Thought Content: Thought content normal.        Judgment: Judgment normal.     BP 135/79   Pulse 87   Temp 97.8 F (36.6 C) (Temporal)   Resp 20   Ht 5' 2" (1.575 m)   Wt 172 lb (78 kg)   LMP 12/07/2007   SpO2 97%   BMI 31.46 kg/m        Assessment & Plan:  Sweetie M Bueso comes in today with chief complaint of Medical Management of Chronic Issues   Diagnosis and orders addressed:  1. Hyperlipidemia with target LDL less than 100 Low fat diet - atorvastatin (LIPITOR) 40 MG tablet; Take 1 tablet (40 mg total) by mouth daily.  Dispense: 90 tablet; Refill: 1 - omega-3 acid ethyl esters (LOVAZA) 1 g capsule; Take 1 capsule (1 g total) by mouth 2 (two) times daily.  Dispense: 90 capsule; Refill: 1  2. Metabolic syndrome Watch carbsin diet  3. Low serum vitamin D Continue daily vitamin d supplement  4. Osteopenia, unspecified location Weight bearing exercise  5. BMI 28.0-28.9,adult Discussed diet and exercise for person with BMI >25 Will recheck weight in 3-6 months  6. Essential hypertension Low sodum diet - hydrochlorothiazide (HYDRODIURIL) 25 MG tablet; Take 1 tablet (25 mg total) by mouth daily.  Dispense: 90 tablet; Refill: 1   Labs pending Health Maintenance reviewed Diet and exercise encouraged  Follow up plan: 6 months   Mary-Margaret Martin, FNP  

## 2020-02-26 NOTE — Patient Instructions (Signed)
Exercising to Stay Healthy To become healthy and stay healthy, it is recommended that you do moderate-intensity and vigorous-intensity exercise. You can tell that you are exercising at a moderate intensity if your heart starts beating faster and you start breathing faster but can still hold a conversation. You can tell that you are exercising at a vigorous intensity if you are breathing much harder and faster and cannot hold a conversation while exercising. Exercising regularly is important. It has many health benefits, such as:  Improving overall fitness, flexibility, and endurance.  Increasing bone density.  Helping with weight control.  Decreasing body fat.  Increasing muscle strength.  Reducing stress and tension.  Improving overall health. How often should I exercise? Choose an activity that you enjoy, and set realistic goals. Your health care provider can help you make an activity plan that works for you. Exercise regularly as told by your health care provider. This may include:  Doing strength training two times a week, such as: ? Lifting weights. ? Using resistance bands. ? Push-ups. ? Sit-ups. ? Yoga.  Doing a certain intensity of exercise for a given amount of time. Choose from these options: ? A total of 150 minutes of moderate-intensity exercise every week. ? A total of 75 minutes of vigorous-intensity exercise every week. ? A mix of moderate-intensity and vigorous-intensity exercise every week. Children, pregnant women, people who have not exercised regularly, people who are overweight, and older adults may need to talk with a health care provider about what activities are safe to do. If you have a medical condition, be sure to talk with your health care provider before you start a new exercise program. What are some exercise ideas? Moderate-intensity exercise ideas include:  Walking 1 mile (1.6 km) in about 15  minutes.  Biking.  Hiking.  Golfing.  Dancing.  Water aerobics. Vigorous-intensity exercise ideas include:  Walking 4.5 miles (7.2 km) or more in about 1 hour.  Jogging or running 5 miles (8 km) in about 1 hour.  Biking 10 miles (16.1 km) or more in about 1 hour.  Lap swimming.  Roller-skating or in-line skating.  Cross-country skiing.  Vigorous competitive sports, such as football, basketball, and soccer.  Jumping rope.  Aerobic dancing. What are some everyday activities that can help me to get exercise?  Yard work, such as: ? Pushing a lawn mower. ? Raking and bagging leaves.  Washing your car.  Pushing a stroller.  Shoveling snow.  Gardening.  Washing windows or floors. How can I be more active in my day-to-day activities?  Use stairs instead of an elevator.  Take a walk during your lunch break.  If you drive, park your car farther away from your work or school.  If you take public transportation, get off one stop early and walk the rest of the way.  Stand up or walk around during all of your indoor phone calls.  Get up, stretch, and walk around every 30 minutes throughout the day.  Enjoy exercise with a friend. Support to continue exercising will help you keep a regular routine of activity. What guidelines can I follow while exercising?  Before you start a new exercise program, talk with your health care provider.  Do not exercise so much that you hurt yourself, feel dizzy, or get very short of breath.  Wear comfortable clothes and wear shoes with good support.  Drink plenty of water while you exercise to prevent dehydration or heat stroke.  Work out until your breathing   and your heartbeat get faster. Where to find more information  [place] Department of Health and Human Services: www.hhs.gov  Centers for Disease Control and Prevention (CDC): www.cdc.gov Summary  Exercising regularly is important. It will improve your overall fitness,  flexibility, and endurance.  Regular exercise also will improve your overall health. It can help you control your weight, reduce stress, and improve your bone density.  Do not exercise so much that you hurt yourself, feel dizzy, or get very short of breath.  Before you start a new exercise program, talk with your health care provider. This information is not intended to replace advice given to you by your health care provider. Make sure you discuss any questions you have with your health care provider. Document Revised: 11/04/2017 Document Reviewed: 10/13/2017 Elsevier Patient Education  2020 Elsevier Inc.  

## 2020-02-27 LAB — CMP14+EGFR
ALT: 16 IU/L (ref 0–32)
AST: 17 IU/L (ref 0–40)
Albumin/Globulin Ratio: 1.8 (ref 1.2–2.2)
Albumin: 4.6 g/dL (ref 3.8–4.9)
Alkaline Phosphatase: 118 IU/L — ABNORMAL HIGH (ref 39–117)
BUN/Creatinine Ratio: 14 (ref 12–28)
BUN: 11 mg/dL (ref 8–27)
Bilirubin Total: 0.3 mg/dL (ref 0.0–1.2)
CO2: 27 mmol/L (ref 20–29)
Calcium: 10 mg/dL (ref 8.7–10.3)
Chloride: 101 mmol/L (ref 96–106)
Creatinine, Ser: 0.81 mg/dL (ref 0.57–1.00)
GFR calc Af Amer: 91 mL/min/{1.73_m2} (ref >59)
GFR calc non Af Amer: 79 mL/min/{1.73_m2} (ref >59)
Globulin, Total: 2.5 g/dL (ref 1.5–4.5)
Glucose: 102 mg/dL — ABNORMAL HIGH (ref 65–99)
Potassium: 4.8 mmol/L (ref 3.5–5.2)
Sodium: 143 mmol/L (ref 134–144)
Total Protein: 7.1 g/dL (ref 6.0–8.5)

## 2020-02-27 LAB — LIPID PANEL
Chol/HDL Ratio: 4.2 ratio (ref 0.0–4.4)
Cholesterol, Total: 193 mg/dL (ref 100–199)
HDL: 46 mg/dL (ref >39)
LDL Chol Calc (NIH): 103 mg/dL — ABNORMAL HIGH (ref 0–99)
Triglycerides: 258 mg/dL — ABNORMAL HIGH (ref 0–149)
VLDL Cholesterol Cal: 44 mg/dL — ABNORMAL HIGH (ref 5–40)

## 2020-02-27 LAB — CBC WITH DIFFERENTIAL/PLATELET
Basophils Absolute: 0.1 10*3/uL (ref 0.0–0.2)
Basos: 1 %
EOS (ABSOLUTE): 0.1 10*3/uL (ref 0.0–0.4)
Eos: 2 %
Hematocrit: 38.4 % (ref 34.0–46.6)
Hemoglobin: 13.1 g/dL (ref 11.1–15.9)
Immature Grans (Abs): 0 10*3/uL (ref 0.0–0.1)
Immature Granulocytes: 0 %
Lymphocytes Absolute: 2.2 10*3/uL (ref 0.7–3.1)
Lymphs: 34 %
MCH: 30.9 pg (ref 26.6–33.0)
MCHC: 34.1 g/dL (ref 31.5–35.7)
MCV: 91 fL (ref 79–97)
Monocytes Absolute: 0.5 10*3/uL (ref 0.1–0.9)
Monocytes: 7 %
Neutrophils Absolute: 3.8 10*3/uL (ref 1.4–7.0)
Neutrophils: 56 %
Platelets: 299 10*3/uL (ref 150–450)
RBC: 4.24 x10E6/uL (ref 3.77–5.28)
RDW: 12 % (ref 11.7–15.4)
WBC: 6.6 10*3/uL (ref 3.4–10.8)

## 2020-05-19 ENCOUNTER — Other Ambulatory Visit: Payer: Self-pay | Admitting: *Deleted

## 2020-05-19 DIAGNOSIS — I10 Essential (primary) hypertension: Secondary | ICD-10-CM

## 2020-05-19 MED ORDER — HYDROCHLOROTHIAZIDE 25 MG PO TABS: 25.0000 mg | ORAL_TABLET | Freq: Every day | ORAL | 0 refills | Status: DC

## 2020-07-18 MED FILL — OMEGA-3-ACID ETHYL ESTERS 1: 1 | 45 days supply | Qty: 90 | Fill #1

## 2020-07-18 MED FILL — ATORVASTATIN CALCIUM 40 MG: 40 | 90 days supply | Qty: 90 | Fill #0

## 2020-08-14 ENCOUNTER — Ambulatory Visit (INDEPENDENT_AMBULATORY_CARE_PROVIDER_SITE_OTHER): Payer: No Typology Code available for payment source | Admitting: Nurse Practitioner

## 2020-08-14 ENCOUNTER — Other Ambulatory Visit: Payer: Self-pay | Admitting: Nurse Practitioner

## 2020-08-14 ENCOUNTER — Other Ambulatory Visit: Payer: Self-pay

## 2020-08-14 ENCOUNTER — Encounter: Payer: Self-pay | Admitting: Nurse Practitioner

## 2020-08-14 VITALS — BP 135/79 | HR 67 | Temp 98.1°F | Resp 20 | Ht 62.0 in | Wt 170.0 lb

## 2020-08-14 DIAGNOSIS — Z6828 Body mass index (BMI) 28.0-28.9, adult: Secondary | ICD-10-CM | POA: Diagnosis not present

## 2020-08-14 DIAGNOSIS — E785 Hyperlipidemia, unspecified: Secondary | ICD-10-CM

## 2020-08-14 DIAGNOSIS — I10 Essential (primary) hypertension: Secondary | ICD-10-CM

## 2020-08-14 MED ORDER — HYDROCHLOROTHIAZIDE 25 MG PO TABS: 25.0000 mg | ORAL_TABLET | Freq: Every day | ORAL | 1 refills | Status: DC

## 2020-08-14 MED ORDER — ATORVASTATIN CALCIUM 40 MG PO TABS: 40.0000 mg | ORAL_TABLET | Freq: Every day | ORAL | 1 refills | Status: DC

## 2020-08-14 MED FILL — HYDROCHLOROTHIAZIDE 25 MG T: 25 | 90 days supply | Qty: 90 | Fill #0

## 2020-08-14 NOTE — Patient Instructions (Signed)
DASH Eating Plan DASH stands for "Dietary Approaches to Stop Hypertension." The DASH eating plan is a healthy eating plan that has been shown to reduce high blood pressure (hypertension). It may also reduce your risk for type 2 diabetes, heart disease, and stroke. The DASH eating plan may also help with weight loss. What are tips for following this plan?  General guidelines  Avoid eating more than 2,300 mg (milligrams) of salt (sodium) a day. If you have hypertension, you may need to reduce your sodium intake to 1,500 mg a day.  Limit alcohol intake to no more than 1 drink a day for nonpregnant women and 2 drinks a day for men. One drink equals 12 oz of beer, 5 oz of wine, or 1 oz of hard liquor.  Work with your health care provider to maintain a healthy body weight or to lose weight. Ask what an ideal weight is for you.  Get at least 30 minutes of exercise that causes your heart to beat faster (aerobic exercise) most days of the week. Activities may include walking, swimming, or biking.  Work with your health care provider or diet and nutrition specialist (dietitian) to adjust your eating plan to your individual calorie needs. Reading food labels   Check food labels for the amount of sodium per serving. Choose foods with less than 5 percent of the Daily Value of sodium. Generally, foods with less than 300 mg of sodium per serving fit into this eating plan.  To find whole grains, look for the word "whole" as the first word in the ingredient list. Shopping  Buy products labeled as "low-sodium" or "no salt added."  Buy fresh foods. Avoid canned foods and premade or frozen meals. Cooking  Avoid adding salt when cooking. Use salt-free seasonings or herbs instead of table salt or sea salt. Check with your health care provider or pharmacist before using salt substitutes.  Do not fry foods. Cook foods using healthy methods such as baking, boiling, grilling, and broiling instead.  Cook with  heart-healthy oils, such as olive, canola, soybean, or sunflower oil. Meal planning  Eat a balanced diet that includes: ? 5 or more servings of fruits and vegetables each day. At each meal, try to fill half of your plate with fruits and vegetables. ? Up to 6-8 servings of whole grains each day. ? Less than 6 oz of lean meat, poultry, or fish each day. A 3-oz serving of meat is about the same size as a deck of cards. One egg equals 1 oz. ? 2 servings of low-fat dairy each day. ? A serving of nuts, seeds, or beans 5 times each week. ? Heart-healthy fats. Healthy fats called Omega-3 fatty acids are found in foods such as flaxseeds and coldwater fish, like sardines, salmon, and mackerel.  Limit how much you eat of the following: ? Canned or prepackaged foods. ? Food that is high in trans fat, such as fried foods. ? Food that is high in saturated fat, such as fatty meat. ? Sweets, desserts, sugary drinks, and other foods with added sugar. ? Full-fat dairy products.  Do not salt foods before eating.  Try to eat at least 2 vegetarian meals each week.  Eat more home-cooked food and less restaurant, buffet, and fast food.  When eating at a restaurant, ask that your food be prepared with less salt or no salt, if possible. What foods are recommended? The items listed may not be a complete list. Talk with your dietitian about   what dietary choices are best for you. Grains Whole-grain or whole-wheat bread. Whole-grain or whole-wheat pasta. Brown rice. Oatmeal. Quinoa. Bulgur. Whole-grain and low-sodium cereals. Pita bread. Low-fat, low-sodium crackers. Whole-wheat flour tortillas. Vegetables Fresh or frozen vegetables (raw, steamed, roasted, or grilled). Low-sodium or reduced-sodium tomato and vegetable juice. Low-sodium or reduced-sodium tomato sauce and tomato paste. Low-sodium or reduced-sodium canned vegetables. Fruits All fresh, dried, or frozen fruit. Canned fruit in natural juice (without  added sugar). Meat and other protein foods Skinless chicken or turkey. Ground chicken or turkey. Pork with fat trimmed off. Fish and seafood. Egg whites. Dried beans, peas, or lentils. Unsalted nuts, nut butters, and seeds. Unsalted canned beans. Lean cuts of beef with fat trimmed off. Low-sodium, lean deli meat. Dairy Low-fat (1%) or fat-free (skim) milk. Fat-free, low-fat, or reduced-fat cheeses. Nonfat, low-sodium ricotta or cottage cheese. Low-fat or nonfat yogurt. Low-fat, low-sodium cheese. Fats and oils Soft margarine without trans fats. Vegetable oil. Low-fat, reduced-fat, or light mayonnaise and salad dressings (reduced-sodium). Canola, safflower, olive, soybean, and sunflower oils. Avocado. Seasoning and other foods Herbs. Spices. Seasoning mixes without salt. Unsalted popcorn and pretzels. Fat-free sweets. What foods are not recommended? The items listed may not be a complete list. Talk with your dietitian about what dietary choices are best for you. Grains Baked goods made with fat, such as croissants, muffins, or some breads. Dry pasta or rice meal packs. Vegetables Creamed or fried vegetables. Vegetables in a cheese sauce. Regular canned vegetables (not low-sodium or reduced-sodium). Regular canned tomato sauce and paste (not low-sodium or reduced-sodium). Regular tomato and vegetable juice (not low-sodium or reduced-sodium). Pickles. Olives. Fruits Canned fruit in a light or heavy syrup. Fried fruit. Fruit in cream or butter sauce. Meat and other protein foods Fatty cuts of meat. Ribs. Fried meat. Bacon. Sausage. Bologna and other processed lunch meats. Salami. Fatback. Hotdogs. Bratwurst. Salted nuts and seeds. Canned beans with added salt. Canned or smoked fish. Whole eggs or egg yolks. Chicken or turkey with skin. Dairy Whole or 2% milk, cream, and half-and-half. Whole or full-fat cream cheese. Whole-fat or sweetened yogurt. Full-fat cheese. Nondairy creamers. Whipped toppings.  Processed cheese and cheese spreads. Fats and oils Butter. Stick margarine. Lard. Shortening. Ghee. Bacon fat. Tropical oils, such as coconut, palm kernel, or palm oil. Seasoning and other foods Salted popcorn and pretzels. Onion salt, garlic salt, seasoned salt, table salt, and sea salt. Worcestershire sauce. Tartar sauce. Barbecue sauce. Teriyaki sauce. Soy sauce, including reduced-sodium. Steak sauce. Canned and packaged gravies. Fish sauce. Oyster sauce. Cocktail sauce. Horseradish that you find on the shelf. Ketchup. Mustard. Meat flavorings and tenderizers. Bouillon cubes. Hot sauce and Tabasco sauce. Premade or packaged marinades. Premade or packaged taco seasonings. Relishes. Regular salad dressings. Where to find more information:  National Heart, Lung, and Blood Institute: www.nhlbi.nih.gov  American Heart Association: www.heart.org Summary  The DASH eating plan is a healthy eating plan that has been shown to reduce high blood pressure (hypertension). It may also reduce your risk for type 2 diabetes, heart disease, and stroke.  With the DASH eating plan, you should limit salt (sodium) intake to 2,300 mg a day. If you have hypertension, you may need to reduce your sodium intake to 1,500 mg a day.  When on the DASH eating plan, aim to eat more fresh fruits and vegetables, whole grains, lean proteins, low-fat dairy, and heart-healthy fats.  Work with your health care provider or diet and nutrition specialist (dietitian) to adjust your eating plan to your   individual calorie needs. This information is not intended to replace advice given to you by your health care provider. Make sure you discuss any questions you have with your health care provider. Document Revised: 11/04/2017 Document Reviewed: 11/15/2016 Elsevier Patient Education  2020 Elsevier Inc.  

## 2020-08-14 NOTE — Progress Notes (Signed)
Subjective:    Patient ID: Tanya Wilkinson, female    DOB: [birth year], 59 y.o.   MRN: [id number]   Chief Complaint: Medical Management of Chronic Issues    HPI:  1. Hyperlipidemia with target LDL less than 100 Lab Results  Component Value Date   CHOL 193 02/26/2020   HDL 46 02/26/2020   LDLCALC 103 (H) 02/26/2020   TRIG 258 (H) 02/26/2020   CHOLHDL 4.2 02/26/2020  Takes medication as prescribed. States increased vegetable intake. Avoids fried and fast foods.  The 10-year ASCVD risk score Mikey Bussing DC Brooke Bonito., et al., 2013) is: 5.9%   Values used to calculate the score:     Age: 30 years     Sex: Female     Is Non-Hispanic African American: No     Diabetic: No     Tobacco smoker: No     Systolic Blood Pressure: 729 mmHg     Is BP treated: Yes     HDL Cholesterol: 46 mg/dL     Total Cholesterol: 193 mg/dL   2. BMI 28.0-28.9,adult BMI Readings from Last 3 Encounters:  08/14/20 31.09 kg/m  02/26/20 31.46 kg/m  11/12/19 30.89 kg/m   Wt Readings from Last 3 Encounters:  08/14/20 170 lb (77.1 kg)  02/26/20 172 lb (78 kg)  11/12/19 173 lb (78.5 kg)   Walks for exercise.   3. Essential hypertension BP Readings from Last 3 Encounters:  08/14/20 135/79  02/26/20 135/79  10/26/19 120/70   Takes BP at home irregularly. Takes medication as prescribed. Walks daily. Avoids foods high in salt. No chest pain, SOB, or headaches.     Outpatient Encounter Medications as of 08/14/2020  Medication Sig  . atorvastatin (LIPITOR) 40 MG tablet Take 1 tablet (40 mg total) by mouth daily.  Marland Kitchen CALCIUM PO Take by mouth.  . Cholecalciferol (VITAMIN D) 2000 units CAPS Take by mouth.  . hydrochlorothiazide (HYDRODIURIL) 25 MG tablet Take 1 tablet (25 mg total) by mouth daily.  . Multiple Vitamins-Minerals (CENTRUM SILVER PO) Take 1 tablet by mouth daily.  Marland Kitchen omega-3 acid ethyl esters (LOVAZA) 1 g capsule Take 1 capsule (1 g total) by mouth 2 (two) times daily.  . [DISCONTINUED] Blood Pressure  Monitoring (BLOOD PRESSURE KIT) DEVI 1 Device by Does not apply route daily. Check Bp daily and as needed DX I10   No facility-administered encounter medications on file as of 08/14/2020.    Past Surgical History:  Procedure Laterality Date  . ANTERIOR CERVICAL DECOMP/DISCECTOMY FUSION  08/2006  . CESAREAN SECTION  [id number]  . COLONOSCOPY  [facility] in [place]   . TUBAL LIGATION  0211,1552   BTL    Family History  Problem Relation Age of Onset  . Diabetes Mother   . Hypertension Mother   . Kidney disease Mother   . Cancer Father        throat & lung-smoker  . Stroke Father   . Stroke Brother   . Hypertension Brother   . Cancer Brother 45       bladder  . Stroke Brother   . Early death Brother 77  . Stroke Son   . Hypertension Son   . Colon cancer Neg Hx   . Pancreatic cancer Neg Hx   . Stomach cancer Neg Hx   . Esophageal cancer Neg Hx   . Breast cancer Neg Hx     New complaints: No new complaints today.  Social history: Lives at home with husband,  gardens, spends time with family/grandchildren. Travels.   Controlled substance contract: n/a    Review of Systems  Constitutional: Negative.   HENT: Negative.   Eyes: Negative.   Respiratory: Negative.   Cardiovascular: Negative.   Gastrointestinal: Negative.   Endocrine: Negative.   Genitourinary: Negative.   Musculoskeletal: Negative.   Skin: Negative.   Allergic/Immunologic: Negative.   Neurological: Negative.   Hematological: Negative.   Psychiatric/Behavioral: Negative.   All other systems reviewed and are negative.      Objective:   Physical Exam Vitals and nursing note reviewed.  Constitutional:      Appearance: Normal appearance.  HENT:     Head: Normocephalic and atraumatic.     Right Ear: Tympanic membrane, ear canal and external ear normal.     Left Ear: Tympanic membrane, ear canal and external ear normal.     Nose: Nose normal.     Mouth/Throat:     Mouth: Mucous membranes are  moist.     Pharynx: Oropharynx is clear.  Eyes:     Extraocular Movements: Extraocular movements intact.     Conjunctiva/sclera: Conjunctivae normal.     Pupils: Pupils are equal, round, and reactive to light.  Cardiovascular:     Rate and Rhythm: Normal rate and regular rhythm.     Pulses: Normal pulses.     Heart sounds: Normal heart sounds.  Abdominal:     General: Bowel sounds are normal.  Musculoskeletal:        General: Normal range of motion.     Cervical back: Normal range of motion.  Skin:    General: Skin is warm and dry.     Capillary Refill: Capillary refill takes less than 2 seconds.  Neurological:     General: No focal deficit present.     Mental Status: She is alert and oriented to person, place, and time. Mental status is at baseline.  Psychiatric:        Mood and Affect: Mood normal.        Behavior: Behavior normal.        Thought Content: Thought content normal.        Judgment: Judgment normal.     BP 135/79   Pulse 67   Temp 98.1 F (36.7 C) (Temporal)   Resp 20   Ht _0  (1.575 m)   Wt 170 lb (77.1 kg)   LMP 12/07/2007   SpO2 97%   BMI 31.09 kg/m      Assessment & Plan:  Tanya Wilkinson comes in today with chief complaint of Medical Management of Chronic Issues   Diagnosis and orders addressed:  1. Hyperlipidemia with target LDL less than 100 Avoid foods that are high in fat or fried. Take medication as prescribed. Stay active.   2. BMI 28.0-28.9,adult Stay active, walking or swimming are great cardiovascular exercises that help lower blood pressure, cholesterol levels, and weight.   3. Essential hypertension Take medication as prescribed. Eat a heart healthy diet and avoid foods that are high in salt.    Labs pending Health Maintenance reviewed Diet and exercise encouraged  Follow up plan: Follow up in 6 months.   Mary-Margaret Hassell Done, FNP

## 2020-08-15 LAB — CBC WITH DIFFERENTIAL/PLATELET
Basophils Absolute: 0 10*3/uL (ref 0.0–0.2)
Basos: 1 %
EOS (ABSOLUTE): 0.1 10*3/uL (ref 0.0–0.4)
Eos: 2 %
Hematocrit: 39.1 % (ref 34.0–46.6)
Hemoglobin: 12.8 g/dL (ref 11.1–15.9)
Immature Grans (Abs): 0 10*3/uL (ref 0.0–0.1)
Immature Granulocytes: 0 %
Lymphocytes Absolute: 1.8 10*3/uL (ref 0.7–3.1)
Lymphs: 27 %
MCH: 29.7 pg (ref 26.6–33.0)
MCHC: 32.7 g/dL (ref 31.5–35.7)
MCV: 91 fL (ref 79–97)
Monocytes Absolute: 0.5 10*3/uL (ref 0.1–0.9)
Monocytes: 7 %
Neutrophils Absolute: 4.4 10*3/uL (ref 1.4–7.0)
Neutrophils: 63 %
Platelets: 289 10*3/uL (ref 150–450)
RBC: 4.31 x10E6/uL (ref 3.77–5.28)
RDW: 11.9 % (ref 11.7–15.4)
WBC: 6.9 10*3/uL (ref 3.4–10.8)

## 2020-08-15 LAB — CMP14+EGFR
ALT: 17 IU/L (ref 0–32)
AST: 20 IU/L (ref 0–40)
Albumin/Globulin Ratio: 2.1 (ref 1.2–2.2)
Albumin: 4.6 g/dL (ref 3.8–4.8)
Alkaline Phosphatase: 124 IU/L — ABNORMAL HIGH (ref 48–121)
BUN/Creatinine Ratio: 14 (ref 12–28)
BUN: 11 mg/dL (ref 8–27)
Bilirubin Total: 0.6 mg/dL (ref 0.0–1.2)
CO2: 28 mmol/L (ref 20–29)
Calcium: 9.7 mg/dL (ref 8.7–10.3)
Chloride: 98 mmol/L (ref 96–106)
Creatinine, Ser: 0.77 mg/dL (ref 0.57–1.00)
GFR calc Af Amer: 96 mL/min/{1.73_m2} (ref >59)
GFR calc non Af Amer: 84 mL/min/{1.73_m2} (ref >59)
Globulin, Total: 2.2 g/dL (ref 1.5–4.5)
Glucose: 95 mg/dL (ref 65–99)
Potassium: 4.1 mmol/L (ref 3.5–5.2)
Sodium: 140 mmol/L (ref 134–144)
Total Protein: 6.8 g/dL (ref 6.0–8.5)

## 2020-08-15 LAB — LIPID PANEL
Chol/HDL Ratio: 4.2 ratio (ref 0.0–4.4)
Cholesterol, Total: 190 mg/dL (ref 100–199)
HDL: 45 mg/dL (ref >39)
LDL Chol Calc (NIH): 121 mg/dL — ABNORMAL HIGH (ref 0–99)
Triglycerides: 135 mg/dL (ref 0–149)
VLDL Cholesterol Cal: 24 mg/dL (ref 5–40)

## 2020-08-29 ENCOUNTER — Ambulatory Visit: Payer: Self-pay | Admitting: Nurse Practitioner

## 2020-11-22 IMAGING — MG DIGITAL DIAGNOSTIC BILAT W/ TOMO W/ CAD
6 of 10 series · 6 of 30 positions shown · non-contrast
Comparison: Previous exam(s).

CLINICAL DATA: 58-year-old with a possible palpable lump involving
the OUTER RIGHT breast at far POSTERIOR depth or in the low RIGHT
axilla on recent clinical examination. The patient is unable to feel
a lump in this location. Annual evaluation, LEFT breast.

EXAM:
DIGITAL DIAGNOSTIC BILATERAL MAMMOGRAM WITH CAD AND TOMO
ULTRASOUND RIGHT BREAST

[R CC synth-2D]
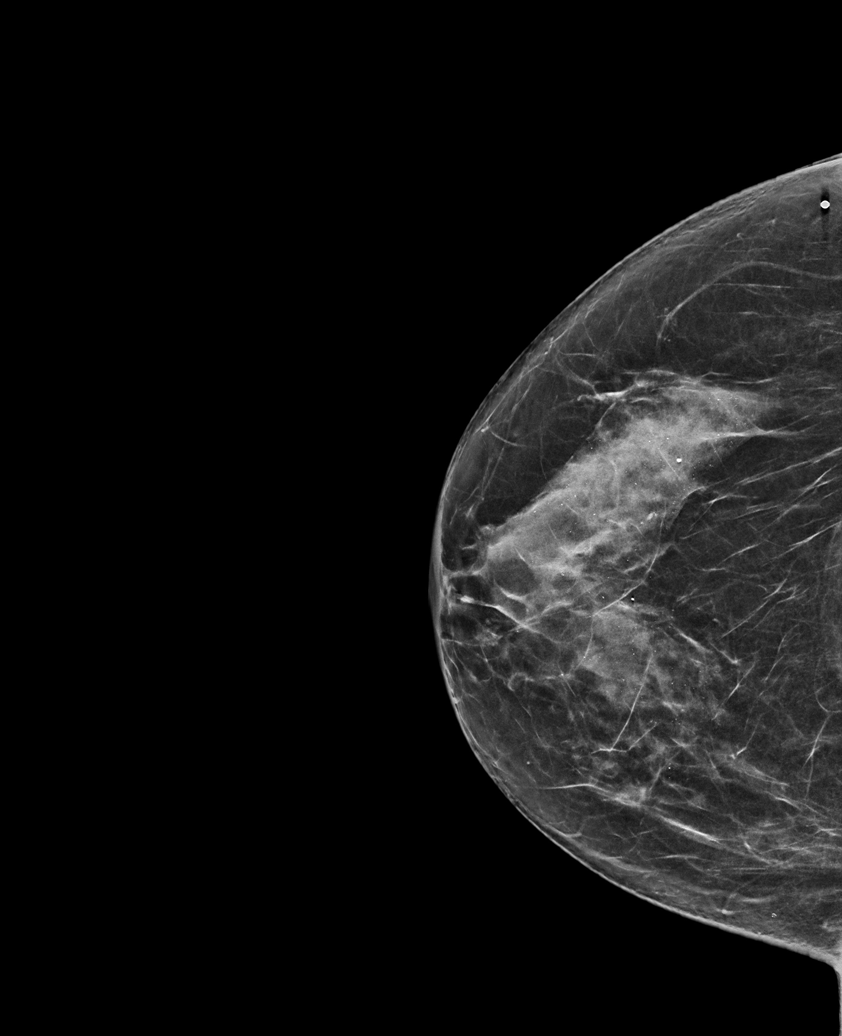

[R TAN synth-2D]
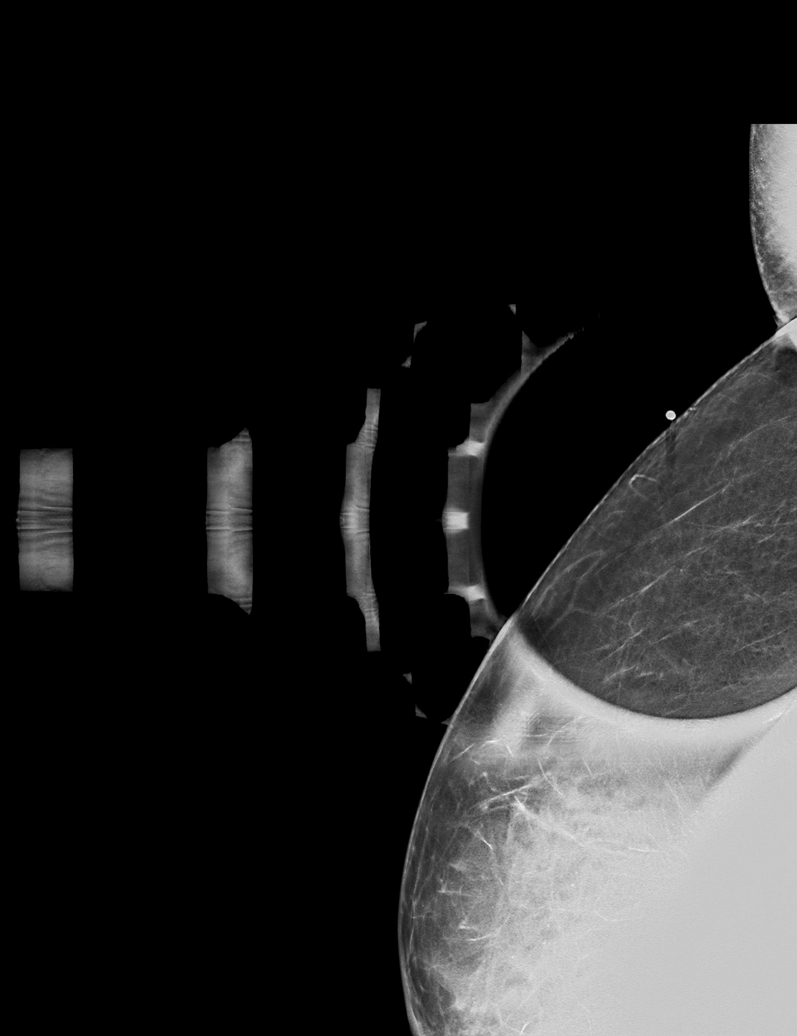

[L CC synth-2D]
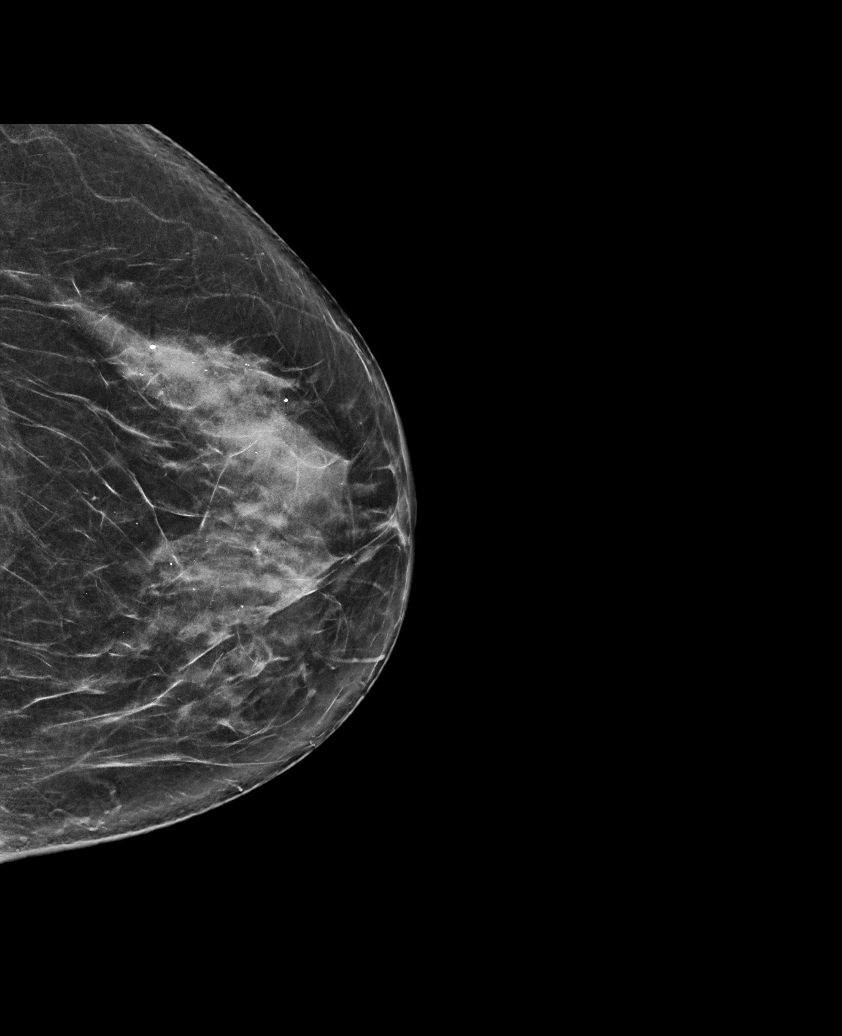

[L MLO synth-2D]
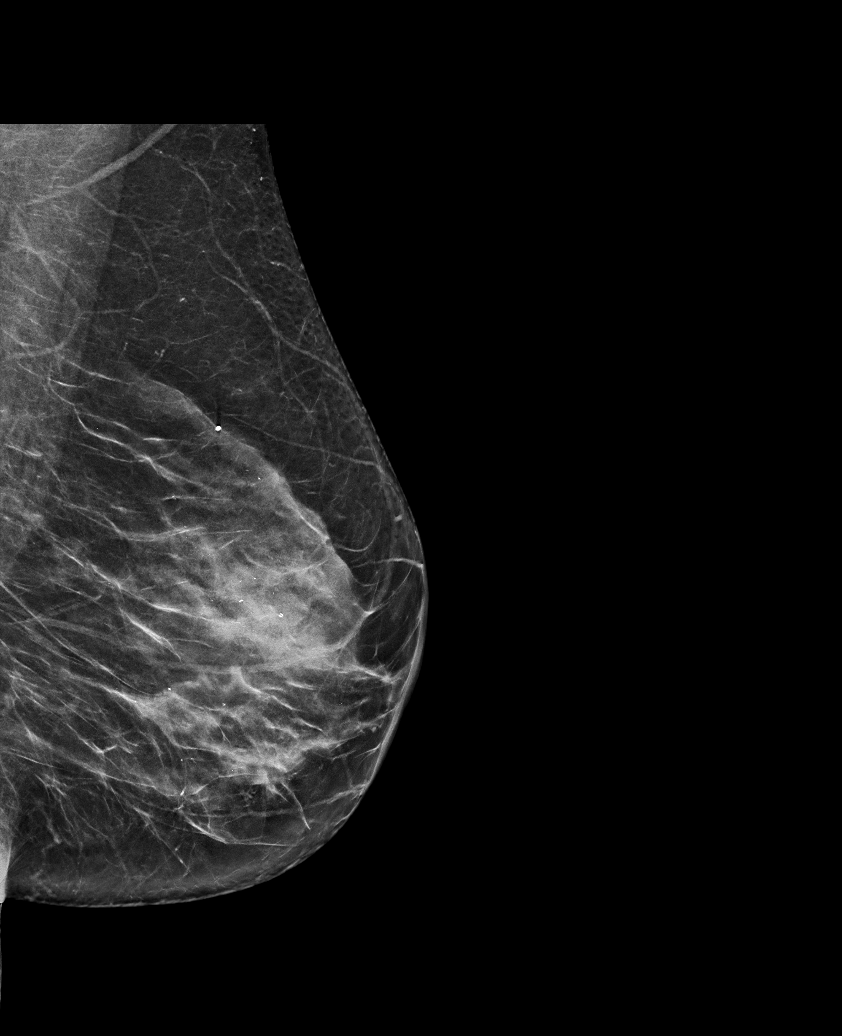

[R MLO synth-2D]
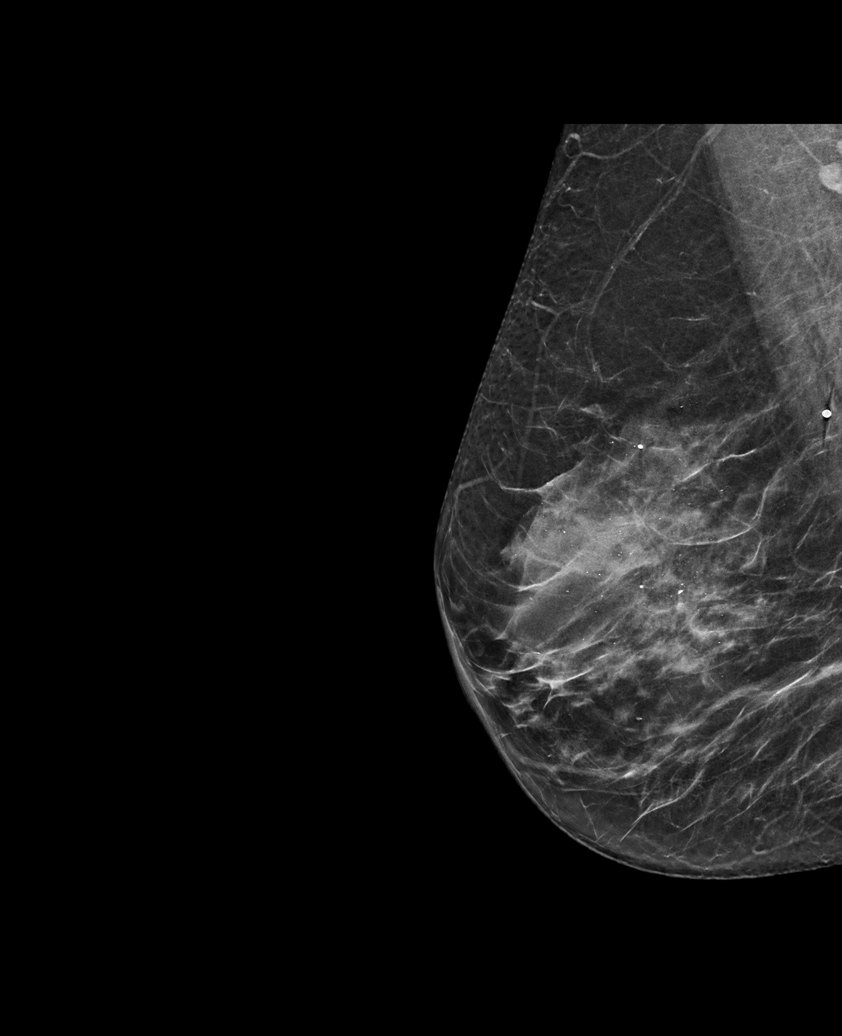

[L MLO tomo · tomo slice 35/70.0]
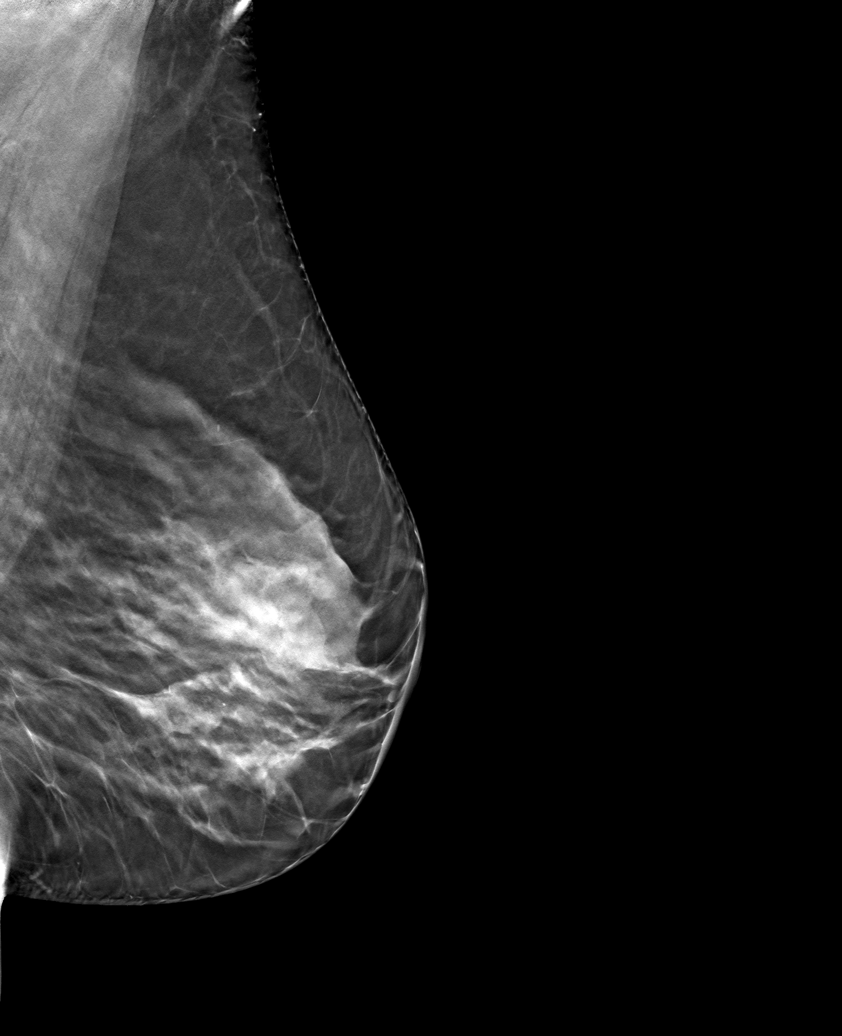

[6 of 30 positions shown; findings below may reference images not displayed]

ACR Breast Density Category c: The breast tissue is heterogeneously
dense, which may obscure small masses.
FINDINGS: Tomosynthesis and synthesized full field CC and MLO views of both
breasts were obtained. Tomosynthesis and synthesized spot
compression tangential view of the area of concern in the RIGHT
breast was also obtained.

No mammographic abnormality in the area of palpable concern in the
OUTER RIGHT breast at far POSTERIOR depth.

No findings suspicious for malignancy in either breast.

Mammographic images were processed with CAD.

On correlative physical exam, I do not palpate a discrete mass in
the OUTER RIGHT breast or in the RIGHT axilla.

Targeted RIGHT breast ultrasound is performed, showing normal
scattered fibroglandular tissue throughout the OUTER breast. No
cyst, solid mass or abnormal acoustic shadowing is identified.

Sonographic evaluation of the RIGHT axilla demonstrates no
pathologic lymphadenopathy.
IMPRESSION: 1. No mammographic or sonographic evidence of malignancy involving
the RIGHT breast.
2. No mammographic evidence of malignancy involving the LEFT breast.
3. No pathologic RIGHT axillary lymphadenopathy.

RECOMMENDATION:
Screening mammogram in one year.(Code:EF-[id number])

I have discussed the findings and recommendations with the patient.
If applicable, a reminder letter will be sent to the patient
regarding the next appointment.

BI-RADS CATEGORY  1: Negative.

## 2020-12-06 MED FILL — HYDROCHLOROTHIAZIDE 25 MG T: 25 | 90 days supply | Qty: 90 | Fill #1

## 2020-12-08 ENCOUNTER — Telehealth: Payer: Self-pay

## 2020-12-08 ENCOUNTER — Other Ambulatory Visit: Payer: Self-pay | Admitting: Nurse Practitioner

## 2020-12-08 DIAGNOSIS — E785 Hyperlipidemia, unspecified: Secondary | ICD-10-CM

## 2020-12-08 MED FILL — ATORVASTATIN 40 MG TABLET: 40 | 90 days supply | Qty: 90 | Fill #1

## 2020-12-08 MED FILL — OMEGA-3-ACID ETHYL ESTERS 1: 1 | 90 days supply | Qty: 180 | Fill #0

## 2020-12-08 NOTE — Telephone Encounter (Signed)
Error

## 2020-12-31 ENCOUNTER — Other Ambulatory Visit: Payer: Self-pay | Admitting: Certified Nurse Midwife

## 2020-12-31 ENCOUNTER — Other Ambulatory Visit: Payer: Self-pay | Admitting: Nurse Practitioner

## 2020-12-31 DIAGNOSIS — Z1231 Encounter for screening mammogram for malignant neoplasm of breast: Secondary | ICD-10-CM

## 2021-01-09 NOTE — Progress Notes (Deleted)
60 y.o. [id number] Married White or Caucasian female here for annual exam.      Patient's last menstrual period was 12/07/2007.          Sexually active: {yes no:314532}  The current method of family planning is tubal ligation.    Exercising: {yes no:314532}  {types:19826} Smoker:  {YES NO:22349}  Health Maintenance: Pap:  07-19-18 neg HPV HR neg, 10-26-2019 neg History of abnormal Pap:  no MMG:  11-06-2019 bilateral & rt breast u/s category c density birads 1:neg Colonoscopy:  2015 polyp f/u 35yrs BMD:   2018 osteopenia TDaP:  2018 Gardasil:   n/a Covid-19: pfizer Hep C testing: hep c neg 2016 Screening Labs: ***   reports that she quit smoking about 26 years ago. She has never used smokeless tobacco. She reports that she does not drink alcohol and does not use drugs.  Past Medical History:  Diagnosis Date  . Herniated disc   . Hyperlipidemia   . Osteopenia   . Vitamin D deficiency     Past Surgical History:  Procedure Laterality Date  . ANTERIOR CERVICAL DECOMP/DISCECTOMY FUSION  08/2006  . CESAREAN SECTION  [id number]  . COLONOSCOPY  [facility] in [place]   . TUBAL LIGATION  [id number]   BTL    Current Outpatient Medications  Medication Sig Dispense Refill  . atorvastatin (LIPITOR) 40 MG tablet Take 1 tablet (40 mg total) by mouth daily. 90 tablet 1  . CALCIUM PO Take by mouth.    . Cholecalciferol (VITAMIN D) 2000 units CAPS Take by mouth.    . hydrochlorothiazide (HYDRODIURIL) 25 MG tablet Take 1 tablet (25 mg total) by mouth daily. 90 tablet 1  . Multiple Vitamins-Minerals (CENTRUM SILVER PO) Take 1 tablet by mouth daily.    Marland Kitchen omega-3 acid ethyl esters (LOVAZA) 1 g capsule TAKE 1 CAPSULE BY MOUTH TWICE DAILY 180 capsule 0   No current facility-administered medications for this visit.    Family History  Problem Relation Age of Onset  . Diabetes Mother   . Hypertension Mother   . Kidney disease Mother   . Cancer Father        throat & lung-smoker  . Stroke  Father   . Stroke Brother   . Hypertension Brother   . Cancer Brother 58       bladder  . Stroke Brother   . Early death Brother 39  . Stroke Son   . Hypertension Son   . Colon cancer Neg Hx   . Pancreatic cancer Neg Hx   . Stomach cancer Neg Hx   . Esophageal cancer Neg Hx   . Breast cancer Neg Hx     Review of Systems  Exam:   LMP 12/07/2007      General appearance: alert, cooperative and appears stated age, no acute distress Head: Normocephalic, without obvious abnormality Neck: no adenopathy, thyroid {EXAM; THYROID:18604} Lungs: clear to auscultation bilaterally Breasts: {Exam; breast:13139::"normal appearance, no masses or tenderness"} Heart: regular rate and rhythm Abdomen: soft, non-tender; no masses,  no organomegaly Extremities: extremities normal, no edema Skin: No rashes or lesions Lymph nodes: Cervical, supraclavicular, and axillary nodes normal. No abnormal inguinal nodes palpated Neurologic: Grossly normal   Pelvic: External genitalia:  no lesions              Urethra:  normal appearing urethra with no masses, tenderness or lesions              Bartholins and Skenes: normal  Vagina: normal appearing vagina, appropriate for age, normal appearing discharge, no lesions              Cervix: neg cervical motion tenderness, no visible lesions             Bimanual Exam:   Uterus:  {exam; uterus:12215}              Adnexa: {exam; adnexa:12223}                 ***, CMA Chaperone was present for exam.  A:  Well Woman with normal exam  P:   Pap :  Mammogram:  Labs:  Medications:

## 2021-01-12 ENCOUNTER — Ambulatory Visit: Payer: No Typology Code available for payment source | Admitting: Nurse Practitioner

## 2021-01-20 NOTE — Progress Notes (Signed)
60 y.o. [id number] Married White or Caucasian female here for annual exam.      Patient's last menstrual period was 12/07/2007.          Sexually active: Yes.    The current method of family planning is tubal ligation and post menopausal status.  Exercising: Yes.    walking Smoker:  no  Health Maintenance: Pap:  07-19-18 neg HPV HR neg, 10-26-2019 neg History of abnormal Pap:  no MMG:  11-06-2019 bilateral & right breast u/s category c density birads 1:neg, scheduled 02-17-2021 Colonoscopy:  2015 polyp f/u 47yrs BMD:   01-10-2020 osteopenia TDaP:  2018 Gardasil:   n/a Covid-19: pfizer Hep C testing: neg 2016 Screening Labs: with PCP   reports that she quit smoking about 26 years ago. She has never used smokeless tobacco. She reports that she does not drink alcohol and does not use drugs.  Past Medical History:  Diagnosis Date  . Herniated disc   . Hyperlipidemia   . Osteopenia   . Vitamin D deficiency     Past Surgical History:  Procedure Laterality Date  . ANTERIOR CERVICAL DECOMP/DISCECTOMY FUSION  08/2006  . CESAREAN SECTION  [id number]  . COLONOSCOPY  [facility] in [place]   . TUBAL LIGATION  [id number]   BTL    Current Outpatient Medications  Medication Sig Dispense Refill  . atorvastatin (LIPITOR) 40 MG tablet Take 1 tablet (40 mg total) by mouth daily. 90 tablet 1  . CALCIUM PO Take by mouth.    . Cholecalciferol (VITAMIN D) 2000 units CAPS Take by mouth.    . ELDERBERRY PO Take by mouth. + zinc & vitamin c    . hydrochlorothiazide (HYDRODIURIL) 25 MG tablet Take 1 tablet (25 mg total) by mouth daily. 90 tablet 1  . Multiple Vitamins-Minerals (CENTRUM SILVER PO) Take 1 tablet by mouth daily.    Marland Kitchen omega-3 acid ethyl esters (LOVAZA) 1 g capsule TAKE 1 CAPSULE BY MOUTH TWICE DAILY 180 capsule 0   No current facility-administered medications for this visit.    Family History  Problem Relation Age of Onset  . Diabetes Mother   . Hypertension Mother   . Kidney  disease Mother   . Cancer Father        throat & lung-smoker  . Stroke Father   . Stroke Brother   . Hypertension Brother   . Cancer Brother 27       bladder  . Stroke Brother   . Early death Brother 55  . Stroke Son   . Hypertension Son   . Colon cancer Neg Hx   . Pancreatic cancer Neg Hx   . Stomach cancer Neg Hx   . Esophageal cancer Neg Hx   . Breast cancer Neg Hx     Physical Exam HENT:     Head:       Review of Systems  Constitutional: Negative.   HENT: Negative.   Eyes: Negative.   Respiratory: Negative.   Cardiovascular: Negative.   Gastrointestinal: Negative.   Endocrine: Negative.   Genitourinary: Negative.   Musculoskeletal: Negative.   Skin: Negative.   Allergic/Immunologic: Negative.   Neurological: Negative.   Hematological: Negative.   Psychiatric/Behavioral: Negative.     Exam:   BP 114/70   Pulse 76   Resp 16   Ht 5' 2.75" (1.594 m)   Wt 171 lb (77.6 kg)   LMP 12/07/2007   BMI 30.53 kg/m   Height: 5' 2.75" (159.4 cm)  General appearance: alert, cooperative and appears stated age, no acute distress Head: Normocephalic, without obvious abnormality Neck: no adenopathy, thyroid normal to inspection and palpation Lungs: clear to auscultation bilaterally Breasts: no palpable mass, no axillary nodes palpable Heart: regular rate and rhythm Abdomen: soft, non-tender; no masses,  no organomegaly Extremities: extremities normal, no edema Skin:see diagram above Lymph nodes: Cervical, supraclavicular, and axillary nodes normal. No abnormal inguinal nodes palpated Neurologic: Grossly normal   Pelvic: External genitalia:  no lesions              Urethra:  normal appearing urethra with no masses, tenderness or lesions              Bartholins and Skenes: normal                 Vagina: normal appearing vagina, appropriate for age, normal appearing discharge, no lesions, cystocele              Cervix: neg cervical motion tenderness, no visible  lesions             Bimanual Exam:   Uterus:  normal size, contour, position, consistency, mobility, non-tender              Adnexa: no mass, fullness, tenderness                 Joy, CMA Chaperone was present for exam.  A:  Well Woman with normal exam  Skin lesion of the face  P:   Pap : next pap due 2023  Mammogram: scheduled 02/17/2021  Labs:with Primary care  Medications: no new today  Referral : Dermatology for skin lesion that hasn't healed in 8 months  Colonoscopy: due now

## 2021-01-22 ENCOUNTER — Ambulatory Visit (INDEPENDENT_AMBULATORY_CARE_PROVIDER_SITE_OTHER): Payer: No Typology Code available for payment source | Admitting: Nurse Practitioner

## 2021-01-22 ENCOUNTER — Other Ambulatory Visit: Payer: Self-pay

## 2021-01-22 ENCOUNTER — Encounter: Payer: Self-pay | Admitting: Nurse Practitioner

## 2021-01-22 VITALS — BP 114/70 | HR 76 | Resp 16 | Ht 62.75 in | Wt 171.0 lb

## 2021-01-22 DIAGNOSIS — Z01419 Encounter for gynecological examination (general) (routine) without abnormal findings: Secondary | ICD-10-CM | POA: Diagnosis not present

## 2021-01-22 DIAGNOSIS — L989 Disorder of the skin and subcutaneous tissue, unspecified: Secondary | ICD-10-CM

## 2021-01-22 NOTE — Patient Instructions (Addendum)
Pap: next pap due 2023  Mammogram: scheduled 02/17/2021  Labs:with Primary care  Medications: no new today  Referral : Dermatology for skin lesion that hasn't healed in 8 months  Colonoscopy: due now  Dexa: [place] for assistance with Kegels exercises  Health Maintenance for Postmenopausal Women Menopause is a normal process in which your ability to get pregnant comes to an end. This process happens slowly over many months or years, usually between the ages of 37 and 79. Menopause is complete when you have missed your menstrual periods for 12 months. It is important to talk with your health care provider about some of the most common conditions that affect women after menopause (postmenopausal women). These include heart disease, cancer, and bone loss (osteoporosis). Adopting a healthy lifestyle and getting preventive care can help to promote your health and wellness. The actions you take can also lower your chances of developing some of these common conditions. What should I know about menopause? During menopause, you may get a number of symptoms, such as:  Hot flashes. These can be moderate or severe.  Night sweats.  Decrease in sex drive.  Mood swings.  Headaches.  Tiredness.  Irritability.  Memory problems.  Insomnia. Choosing to treat or not to treat these symptoms is a decision that you make with your health care provider. Do I need hormone replacement therapy?  Hormone replacement therapy is effective in treating symptoms that are caused by menopause, such as hot flashes and night sweats.  Hormone replacement carries certain risks, especially as you become older. If you are thinking about using estrogen or estrogen with progestin, discuss the benefits and risks with your health care provider. What is my risk for heart disease and stroke? The risk of heart disease, heart attack, and stroke increases as you age. One of the causes may be a change in the  body's hormones during menopause. This can affect how your body uses dietary fats, triglycerides, and cholesterol. Heart attack and stroke are medical emergencies. There are many things that you can do to help prevent heart disease and stroke. Watch your blood pressure  High blood pressure causes heart disease and increases the risk of stroke. This is more likely to develop in people who have high blood pressure readings, are of African descent, or are overweight.  Have your blood pressure checked: ? Every 3-5 years if you are 40-10 years of age. ? Every year if you are 45 years old or older. Eat a healthy diet  Eat a diet that includes plenty of vegetables, fruits, low-fat dairy products, and lean protein.  Do not eat a lot of foods that are high in solid fats, added sugars, or sodium.   Get regular exercise Get regular exercise. This is one of the most important things you can do for your health. Most adults should:  Try to exercise for at least 150 minutes each week. The exercise should increase your heart rate and make you sweat (moderate-intensity exercise).  Try to do strengthening exercises at least twice each week. Do these in addition to the moderate-intensity exercise.  Spend less time sitting. Even light physical activity can be beneficial. Other tips  Work with your health care provider to achieve or maintain a healthy weight.  Do not use any products that contain nicotine or tobacco, such as cigarettes, e-cigarettes, and chewing tobacco. If you need help quitting, ask your health care provider.  Know your numbers. Ask your health care provider  to check your cholesterol and your blood sugar (glucose). Continue to have your blood tested as directed by your health care provider. Do I need screening for cancer? Depending on your health history and family history, you may need to have cancer screening at different stages of your life. This may include screening for:  Breast  cancer.  Cervical cancer.  Lung cancer.  Colorectal cancer. What is my risk for osteoporosis? After menopause, you may be at increased risk for osteoporosis. Osteoporosis is a condition in which bone destruction happens more quickly than new bone creation. To help prevent osteoporosis or the bone fractures that can happen because of osteoporosis, you may take the following actions:  If you are 22-72 years old, get at least 1,000 mg of calcium and at least 600 mg of vitamin D per day.  If you are older than age 14 but younger than age 36, get at least 1,200 mg of calcium and at least 600 mg of vitamin D per day.  If you are older than age 46, get at least 1,200 mg of calcium and at least 800 mg of vitamin D per day. Smoking and drinking excessive alcohol increase the risk of osteoporosis. Eat foods that are rich in calcium and vitamin D, and do weight-bearing exercises several times each week as directed by your health care provider. How does menopause affect my mental health? Depression may occur at any age, but it is more common as you become older. Common symptoms of depression include:  Low or sad mood.  Changes in sleep patterns.  Changes in appetite or eating patterns.  Feeling an overall lack of motivation or enjoyment of activities that you previously enjoyed.  Frequent crying spells. Talk with your health care provider if you think that you are experiencing depression. General instructions See your health care provider for regular wellness exams and vaccines. This may include:  Scheduling regular health, dental, and eye exams.  Getting and maintaining your vaccines. These include: ? Influenza vaccine. Get this vaccine each year before the flu season begins. ? Pneumonia vaccine. ? Shingles vaccine. ? Tetanus, diphtheria, and pertussis (Tdap) booster vaccine. Your health care provider may also recommend other immunizations. Tell your health care provider if you have ever  been abused or do not feel safe at home. Summary  Menopause is a normal process in which your ability to get pregnant comes to an end.  This condition causes hot flashes, night sweats, decreased interest in sex, mood swings, headaches, or lack of sleep.  Treatment for this condition may include hormone replacement therapy.  Take actions to keep yourself healthy, including exercising regularly, eating a healthy diet, watching your weight, and checking your blood pressure and blood sugar levels.  Get screened for cancer and depression. Make sure that you are up to date with all your vaccines. This information is not intended to replace advice given to you by your health care provider. Make sure you discuss any questions you have with your health care provider. Document Revised: 11/15/2018 Document Reviewed: 11/15/2018 Elsevier Patient Education  2021 [employer].

## 2021-01-27 ENCOUNTER — Telehealth: Payer: Self-pay | Admitting: *Deleted

## 2021-01-27 DIAGNOSIS — L989 Disorder of the skin and subcutaneous tissue, unspecified: Secondary | ICD-10-CM

## 2021-01-27 NOTE — Telephone Encounter (Signed)
-----   Message from Karma Ganja, NP sent at 01/22/2021 12:22 PM EST ----- Can you make a referral for Dermatology in [place] for a skin lesion on the right side of nose that has not healed in 8 months Thanks Claiborne Billings

## 2021-01-27 NOTE — Telephone Encounter (Signed)
Patient scheduled at [facility] on [phone] patient scheduled on 06/18/21 @ 1:45pm. Patient informed with time and date.

## 2021-02-12 ENCOUNTER — Ambulatory Visit: Payer: Self-pay | Admitting: Nurse Practitioner

## 2021-02-17 ENCOUNTER — Ambulatory Visit
Admission: RE | Admit: 2021-02-17 | Discharge: 2021-02-17 | Disposition: A | Discharge status: Home / Self Care | Payer: No Typology Code available for payment source | Source: Ambulatory Visit | Attending: Nurse Practitioner | Admitting: Nurse Practitioner

## 2021-02-17 ENCOUNTER — Other Ambulatory Visit: Payer: Self-pay

## 2021-02-17 DIAGNOSIS — Z1231 Encounter for screening mammogram for malignant neoplasm of breast: Secondary | ICD-10-CM

## 2021-02-22 ENCOUNTER — Telehealth: Payer: No Typology Code available for payment source | Admitting: Family

## 2021-02-22 DIAGNOSIS — J069 Acute upper respiratory infection, unspecified: Secondary | ICD-10-CM

## 2021-02-22 MED ORDER — FLUTICASONE PROPIONATE 50 MCG/ACT NA SUSP: 2.0000 | Freq: Every day | NASAL | 6 refills | Status: DC

## 2021-02-22 MED ORDER — BENZONATATE 100 MG PO CAPS: 100.0000 mg | ORAL_CAPSULE | Freq: Three times a day (TID) | ORAL | 0 refills | Status: DC | PRN

## 2021-02-22 NOTE — Progress Notes (Signed)

## 2021-02-23 ENCOUNTER — Ambulatory Visit: Payer: No Typology Code available for payment source | Admitting: Nurse Practitioner

## 2021-02-24 ENCOUNTER — Other Ambulatory Visit (HOSPITAL_BASED_OUTPATIENT_CLINIC_OR_DEPARTMENT_OTHER): Payer: Self-pay

## 2021-02-24 ENCOUNTER — Encounter: Payer: Self-pay | Admitting: Nurse Practitioner

## 2021-02-28 ENCOUNTER — Telehealth: Payer: No Typology Code available for payment source | Admitting: Nurse Practitioner

## 2021-02-28 DIAGNOSIS — J0100 Acute maxillary sinusitis, unspecified: Secondary | ICD-10-CM

## 2021-02-28 MED ORDER — AMOXICILLIN-POT CLAVULANATE 875-125 MG PO TABS: 1.0000 | ORAL_TABLET | Freq: Two times a day (BID) | ORAL | 0 refills | Status: DC

## 2021-02-28 NOTE — Progress Notes (Signed)

## 2021-03-06 ENCOUNTER — Other Ambulatory Visit: Payer: Self-pay

## 2021-03-06 ENCOUNTER — Encounter: Payer: Self-pay | Admitting: Nurse Practitioner

## 2021-03-06 ENCOUNTER — Other Ambulatory Visit: Payer: Self-pay | Admitting: Nurse Practitioner

## 2021-03-06 ENCOUNTER — Ambulatory Visit (INDEPENDENT_AMBULATORY_CARE_PROVIDER_SITE_OTHER): Payer: No Typology Code available for payment source | Admitting: Nurse Practitioner

## 2021-03-06 VITALS — BP 142/90 | HR 84 | Temp 98.0°F | Resp 20 | Ht 62.0 in | Wt 173.0 lb

## 2021-03-06 DIAGNOSIS — I10 Essential (primary) hypertension: Secondary | ICD-10-CM

## 2021-03-06 DIAGNOSIS — E785 Hyperlipidemia, unspecified: Secondary | ICD-10-CM | POA: Diagnosis not present

## 2021-03-06 DIAGNOSIS — M8580 Other specified disorders of bone density and structure, unspecified site: Secondary | ICD-10-CM

## 2021-03-06 DIAGNOSIS — L989 Disorder of the skin and subcutaneous tissue, unspecified: Secondary | ICD-10-CM

## 2021-03-06 DIAGNOSIS — E8881 Metabolic syndrome: Secondary | ICD-10-CM

## 2021-03-06 DIAGNOSIS — Z6828 Body mass index (BMI) 28.0-28.9, adult: Secondary | ICD-10-CM

## 2021-03-06 DIAGNOSIS — R7989 Other specified abnormal findings of blood chemistry: Secondary | ICD-10-CM

## 2021-03-06 MED ORDER — HYDROCHLOROTHIAZIDE 25 MG PO TABS
25.0000 mg | ORAL_TABLET | Freq: Every day | ORAL | 1 refills | Status: DC
Start: 2021-03-06 — End: 2021-03-06

## 2021-03-06 MED ORDER — ATORVASTATIN CALCIUM 40 MG PO TABS: 40.0000 mg | ORAL_TABLET | Freq: Every day | ORAL | 1 refills | Status: DC

## 2021-03-06 MED FILL — ATORVASTATIN 40 MG TABLET: 40 | 90 days supply | Qty: 90 | Fill #0

## 2021-03-06 MED FILL — HYDROCHLOROTHIAZIDE 25 MG T: 25 | 90 days supply | Qty: 90 | Fill #0

## 2021-03-06 NOTE — Patient Instructions (Signed)

## 2021-03-06 NOTE — Progress Notes (Signed)
Subjective:    Patient ID: Tanya Wilkinson, female    DOB: [birth year], 60 y.o.   MRN: [id number]   Chief Complaint: medical management of chronic issues     HPI:  1. Essential hypertension No c/o chest pain, sob or headache. Does not check blood pressure at home. BP Readings from Last 3 Encounters:  01/22/21 114/70  08/14/20 135/79  02/26/20 135/79     2. Hyperlipidemia with target LDL less than 100 Does try to watch diet but does not do much exercise. Is on lipitor daily Lab Results  Component Value Date   CHOL 190 08/14/2020   HDL 45 08/14/2020   LDLCALC 121 (H) 08/14/2020   TRIG 135 08/14/2020   CHOLHDL 4.2 08/14/2020   The 10-year ASCVD risk score Tanya Wilkinson., et al., 2013) is: 6.6%   3. Metabolic syndrome Does not check blood sugars at home. Lab Results  Component Value Date   HGBA1C 5.7 08/28/2019     4. Osteopenia, unspecified location Last dexascan was done 01/10/20 with t score of -2.1. She is on dialy vitamin d and calcium supplement.  5. Low serum vitamin D On daily supplement  6. BMI 28.0-28.9,adult No recent weight changes Wt Readings from Last 3 Encounters:  03/06/21 173 lb (78.5 kg)  01/22/21 171 lb (77.6 kg)  08/14/20 170 lb (77.1 kg)   BMI Readings from Last 3 Encounters:  03/06/21 31.64 kg/m  01/22/21 30.53 kg/m  08/14/20 31.09 kg/m       Outpatient Encounter Medications as of 03/06/2021  Medication Sig  . amoxicillin-clavulanate (AUGMENTIN) 875-125 MG tablet Take 1 tablet by mouth 2 (two) times daily.  Marland Kitchen atorvastatin (LIPITOR) 40 MG tablet Take 1 tablet (40 mg total) by mouth daily.  . benzonatate (TESSALON PERLES) 100 MG capsule Take 1 capsule (100 mg total) by mouth 3 (three) times daily as needed.  Marland Kitchen CALCIUM PO Take by mouth.  . Cholecalciferol (VITAMIN D) 2000 units CAPS Take by mouth.  . ELDERBERRY PO Take by mouth. + zinc & vitamin c  . fluticasone (FLONASE) 50 MCG/ACT nasal spray Place 2 sprays into both nostrils daily.   . hydrochlorothiazide (HYDRODIURIL) 25 MG tablet Take 1 tablet (25 mg total) by mouth daily.  . Multiple Vitamins-Minerals (CENTRUM SILVER PO) Take 1 tablet by mouth daily.  Marland Kitchen omega-3 acid ethyl esters (LOVAZA) 1 g capsule TAKE 1 CAPSULE BY MOUTH TWICE DAILY   No facility-administered encounter medications on file as of 03/06/2021.    Past Surgical History:  Procedure Laterality Date  . ANTERIOR CERVICAL DECOMP/DISCECTOMY FUSION  08/2006  . CESAREAN SECTION  [id number]  . COLONOSCOPY  Tanya Wilkinson in [place]   . TUBAL LIGATION  [id number]   BTL    Family History  Problem Relation Age of Onset  . Diabetes Mother   . Hypertension Mother   . Kidney disease Mother   . Cancer Father        throat & lung-smoker  . Stroke Father   . Stroke Brother   . Hypertension Brother   . Cancer Brother 72       bladder  . Stroke Brother   . Early death Brother 82  . Stroke Son   . Hypertension Son   . Colon cancer Neg Hx   . Pancreatic cancer Neg Hx   . Stomach cancer Neg Hx   . Esophageal cancer Neg Hx   . Breast cancer Neg Hx     New complaints: Has  skin lesion on bridge of her nose- wants referral to dermatology   Social history: Lives with her husband  Controlled substance contract: n/a    Review of Systems  Constitutional: Negative for diaphoresis.  Eyes: Negative for pain.  Respiratory: Negative for shortness of breath.   Cardiovascular: Negative for chest pain, palpitations and leg swelling.  Gastrointestinal: Negative for abdominal pain.  Endocrine: Negative for polydipsia.  Skin: Negative for rash.       Skin lesion on bridge of nose  Neurological: Negative for dizziness, weakness and headaches.  Hematological: Does not bruise/bleed easily.  All other systems reviewed and are negative.      Objective:   Physical Exam Vitals and nursing note reviewed.  Constitutional:      General: She is not in acute distress.    Appearance: Normal appearance. She is  well-developed.  HENT:     Head: Normocephalic.     Nose: Nose normal.  Eyes:     Pupils: Pupils are equal, round, and reactive to light.  Neck:     Vascular: No carotid bruit or JVD.  Cardiovascular:     Rate and Rhythm: Normal rate and regular rhythm.     Heart sounds: Normal heart sounds.  Pulmonary:     Effort: Pulmonary effort is normal. No respiratory distress.     Breath sounds: Normal breath sounds. No wheezing or rales.  Chest:     Chest wall: No tenderness.  Abdominal:     General: Bowel sounds are normal. There is no distension or abdominal bruit.     Palpations: Abdomen is soft. There is no hepatomegaly, splenomegaly, mass or pulsatile mass.     Tenderness: There is no abdominal tenderness.  Musculoskeletal:        General: Normal range of motion.     Cervical back: Normal range of motion and neck supple.  Lymphadenopathy:     Cervical: No cervical adenopathy.  Skin:    General: Skin is warm and dry.  Neurological:     Mental Status: She is alert and oriented to person, place, and time.     Deep Tendon Reflexes: Reflexes are normal and symmetric.  Psychiatric:        Behavior: Behavior normal.        Thought Content: Thought content normal.        Judgment: Judgment normal.    BP (!) 142/90   Pulse 84   Temp 98 F (36.7 C) (Temporal)   Resp 20   Ht _0  (1.575 m)   Wt 173 lb (78.5 kg)   LMP 12/07/2007   SpO2 96%   BMI 31.64 kg/m          Assessment & Plan:  Tanya Wilkinson comes in today with chief complaint of Medical Management of Chronic Issues   Diagnosis and orders addressed:  1. Essential hypertension Low sodium diet - hydrochlorothiazide (HYDRODIURIL) 25 MG tablet; Take 1 tablet (25 mg total) by mouth daily.  Dispense: 90 tablet; Refill: 1 - CBC with Differential/Platelet - CMP14+EGFR  2. Hyperlipidemia with target LDL less than 100 Low fat diet - atorvastatin (LIPITOR) 40 MG tablet; Take 1 tablet (40 mg total) by mouth daily.   Dispense: 90 tablet; Refill: 1 - Lipid panel  3. Metabolic syndrome Watch carbs in diet  4. Osteopenia, unspecified location Weight bearing exercises Continue vitamin d and calcium   5. Low serum vitamin D again continue vitamin d supplement  6. BMI 28.0-28.9,adult Discussed diet and  exercise for person with BMI >25 Will recheck weight in 3-6 months  7. Lesion of skin of nose Do not pick at lesion - Ambulatory referral to Dermatology   Labs pending Health Maintenance reviewed Diet and exercise encouraged  Follow up plan: 6 months   Mary-Margaret Hassell Done, FNP

## 2021-03-07 LAB — CMP14+EGFR
ALT: 53 IU/L — ABNORMAL HIGH (ref 0–32)
AST: 34 IU/L (ref 0–40)
Albumin/Globulin Ratio: 1.9 (ref 1.2–2.2)
Albumin: 4.8 g/dL (ref 3.8–4.8)
Alkaline Phosphatase: 123 IU/L — ABNORMAL HIGH (ref 44–121)
BUN/Creatinine Ratio: 16 (ref 12–28)
BUN: 12 mg/dL (ref 8–27)
Bilirubin Total: 0.4 mg/dL (ref 0.0–1.2)
CO2: 25 mmol/L (ref 20–29)
Calcium: 10 mg/dL (ref 8.7–10.3)
Chloride: 100 mmol/L (ref 96–106)
Creatinine, Ser: 0.74 mg/dL (ref 0.57–1.00)
Globulin, Total: 2.5 g/dL (ref 1.5–4.5)
Glucose: 111 mg/dL — ABNORMAL HIGH (ref 65–99)
Potassium: 5 mmol/L (ref 3.5–5.2)
Sodium: 142 mmol/L (ref 134–144)
Total Protein: 7.3 g/dL (ref 6.0–8.5)
eGFR: 92 mL/min/{1.73_m2} (ref >59)

## 2021-03-07 LAB — CBC WITH DIFFERENTIAL/PLATELET
Basophils Absolute: 0 10*3/uL (ref 0.0–0.2)
Basos: 1 %
EOS (ABSOLUTE): 0.1 10*3/uL (ref 0.0–0.4)
Eos: 2 %
Hematocrit: 38.7 % (ref 34.0–46.6)
Hemoglobin: 13.1 g/dL (ref 11.1–15.9)
Immature Grans (Abs): 0 10*3/uL (ref 0.0–0.1)
Immature Granulocytes: 0 %
Lymphocytes Absolute: 1.5 10*3/uL (ref 0.7–3.1)
Lymphs: 22 %
MCH: 30.8 pg (ref 26.6–33.0)
MCHC: 33.9 g/dL (ref 31.5–35.7)
MCV: 91 fL (ref 79–97)
Monocytes Absolute: 0.5 10*3/uL (ref 0.1–0.9)
Monocytes: 7 %
Neutrophils Absolute: 4.7 10*3/uL (ref 1.4–7.0)
Neutrophils: 68 %
Platelets: 300 10*3/uL (ref 150–450)
RBC: 4.25 x10E6/uL (ref 3.77–5.28)
RDW: 11.8 % (ref 11.7–15.4)
WBC: 6.9 10*3/uL (ref 3.4–10.8)

## 2021-03-07 LAB — LIPID PANEL
Chol/HDL Ratio: 3.7 ratio (ref 0.0–4.4)
Cholesterol, Total: 183 mg/dL (ref 100–199)
HDL: 50 mg/dL (ref >39)
LDL Chol Calc (NIH): 111 mg/dL — ABNORMAL HIGH (ref 0–99)
Triglycerides: 126 mg/dL (ref 0–149)
VLDL Cholesterol Cal: 22 mg/dL (ref 5–40)

## 2021-03-08 ENCOUNTER — Other Ambulatory Visit: Payer: Self-pay

## 2021-04-03 ENCOUNTER — Other Ambulatory Visit: Payer: Self-pay

## 2021-04-03 ENCOUNTER — Encounter (HOSPITAL_COMMUNITY): Payer: Self-pay

## 2021-04-03 ENCOUNTER — Ambulatory Visit (HOSPITAL_COMMUNITY)
Admission: EM | Admit: 2021-04-03 | Discharge: 2021-04-03 | Disposition: A | Discharge status: Home / Self Care | Payer: No Typology Code available for payment source | Attending: Urgent Care | Admitting: Urgent Care

## 2021-04-03 DIAGNOSIS — I10 Essential (primary) hypertension: Secondary | ICD-10-CM | POA: Diagnosis not present

## 2021-04-03 DIAGNOSIS — W57XXXA Bitten or stung by nonvenomous insect and other nonvenomous arthropods, initial encounter: Secondary | ICD-10-CM | POA: Diagnosis not present

## 2021-04-03 DIAGNOSIS — S20461A Insect bite (nonvenomous) of right back wall of thorax, initial encounter: Secondary | ICD-10-CM | POA: Diagnosis not present

## 2021-04-03 DIAGNOSIS — L299 Pruritus, unspecified: Secondary | ICD-10-CM

## 2021-04-03 MED ORDER — TRIAMCINOLONE ACETONIDE 0.1 % EX CREA: 1.0000 "application " | TOPICAL_CREAM | Freq: Two times a day (BID) | CUTANEOUS | 0 refills | Status: DC

## 2021-04-03 MED ORDER — HYDROXYZINE HCL 25 MG PO TABS: 12.5000 mg | ORAL_TABLET | Freq: Three times a day (TID) | ORAL | 0 refills | Status: DC | PRN

## 2021-04-03 NOTE — ED Provider Notes (Signed)
[place]   MRN: [id number] DOB: Sep 08, 1961  Subjective:   Tanya Wilkinson is a 60 y.o. female presenting for 2-day history of suffering a tick bite to her thoracic lateral right back.  Patient reports that her husband worked on getting the tick off and picked at it until he got most of it off.  Wants to make sure that the head of the tick is in left behind.  Reports that she has had some slight itching.  Denies fever, headache, confusion, dizziness, sore throat, cough, chest pain, belly pain.  No current facility-administered medications for this encounter.  Current Outpatient Medications:  .  atorvastatin (LIPITOR) 40 MG tablet, TAKE 1 TABLET BY MOUTH ONCE A DAY, Disp: 90 tablet, Rfl: 1 .  CALCIUM PO, Take by mouth., Disp: , Rfl:  .  Cholecalciferol (VITAMIN D) 2000 units CAPS, Take by mouth., Disp: , Rfl:  .  ELDERBERRY PO, Take by mouth. + zinc & vitamin c, Disp: , Rfl:  .  hydrochlorothiazide (HYDRODIURIL) 25 MG tablet, TAKE 1 TABLET BY MOUTH ONCE A DAY, Disp: 90 tablet, Rfl: 1 .  Multiple Vitamins-Minerals (CENTRUM SILVER PO), Take 1 tablet by mouth daily., Disp: , Rfl:  .  omega-3 acid ethyl esters (LOVAZA) 1 g capsule, TAKE 1 CAPSULE BY MOUTH TWICE DAILY, Disp: 180 capsule, Rfl: 0 .  amoxicillin-clavulanate (AUGMENTIN) 875-125 MG tablet, Take 1 tablet by mouth 2 (two) times daily., Disp: 14 tablet, Rfl: 0 .  benzonatate (TESSALON PERLES) 100 MG capsule, Take 1 capsule (100 mg total) by mouth 3 (three) times daily as needed., Disp: 20 capsule, Rfl: 0 .  fluticasone (FLONASE) 50 MCG/ACT nasal spray, Place 2 sprays into both nostrils daily., Disp: 16 g, Rfl: 6   Allergies  Allergen Reactions  . Morphine And Related Itching    Past Medical History:  Diagnosis Date  . Herniated disc   . Hyperlipidemia   . Osteopenia   . Vitamin D deficiency      Past Surgical History:  Procedure Laterality Date  . ANTERIOR CERVICAL DECOMP/DISCECTOMY FUSION  08/2006  .  CESAREAN SECTION  [id number]  . COLONOSCOPY  [facility] in [place]   . TUBAL LIGATION  [id number]   BTL    Family History  Problem Relation Age of Onset  . Diabetes Mother   . Hypertension Mother   . Kidney disease Mother   . Cancer Father        throat & lung-smoker  . Stroke Father   . Stroke Brother   . Hypertension Brother   . Cancer Brother 3       bladder  . Stroke Brother   . Early death Brother 84  . Stroke Son   . Hypertension Son   . Colon cancer Neg Hx   . Pancreatic cancer Neg Hx   . Stomach cancer Neg Hx   . Esophageal cancer Neg Hx   . Breast cancer Neg Hx     Social History   Tobacco Use  . Smoking status: Former Smoker    Quit date: 12/06/1994    Years since quitting: 26.3  . Smokeless tobacco: Never Used  Vaping Use  . Vaping Use: Never used  Substance Use Topics  . Alcohol use: No  . Drug use: No    ROS   Objective:   Vitals: BP (!) 171/92   Pulse 81   Temp 98.6 F (37 C)   Resp 18   LMP 12/07/2007  SpO2 95%   BP Readings from Last 3 Encounters:  04/03/21 (!) 171/92  03/06/21 (!) 142/90  01/22/21 114/70   Physical Exam Constitutional:      General: She is not in acute distress.    Appearance: Normal appearance. She is well-developed. She is not ill-appearing, toxic-appearing or diaphoretic.  HENT:     Head: Normocephalic and atraumatic.     Nose: Nose normal.     Mouth/Throat:     Mouth: Mucous membranes are moist.     Pharynx: Oropharynx is clear.  Eyes:     General: No scleral icterus.    Extraocular Movements: Extraocular movements intact.     Pupils: Pupils are equal, round, and reactive to light.  Cardiovascular:     Rate and Rhythm: Normal rate.  Pulmonary:     Effort: Pulmonary effort is normal.  Skin:    General: Skin is warm and dry.     Findings: Rash (Resolving tick bite wound over area outlined.  There is slight induration around the perimeter of the wound extending 1 cm circumferentially but no warmth,  tenderness, drainage of pus or bleeding) present.  Neurological:     General: No focal deficit present.     Mental Status: She is alert and oriented to person, place, and time.     Motor: No weakness.     Coordination: Coordination normal.     Gait: Gait normal.     Deep Tendon Reflexes: Reflexes normal.  Psychiatric:        Mood and Affect: Mood normal.        Behavior: Behavior normal.        Thought Content: Thought content normal.        Judgment: Judgment normal.     Assessment and Plan :   PDMP not reviewed this encounter.  1. Itching   2. Tick bite of right back wall of thorax, initial encounter     Recommended conservative management for irritant dermatitis secondary to the tick bite.  Start triamcinolone cream, hydroxyzine as needed.  Regarding her blood pressure, patient states that she forgot her fluid pill today.  She also admits eating salty food today.  Recommended better medical compliance.  Follow-up with PCP. Counseled patient on potential for adverse effects with medications prescribed/recommended today, ER and return-to-clinic precautions discussed, patient verbalized understanding.    Jaynee Eagles, [place] 04/03/21 1752

## 2021-04-03 NOTE — ED Triage Notes (Signed)
Pt's husband found a tick biting her on her upper back two days ago. The spot has been itching and red. Pt's husband removed tick using a tweezers but stated that he thought the tick's head was left behind. Small black spot noted to area with redness and swelling.

## 2021-04-21 ENCOUNTER — Other Ambulatory Visit (HOSPITAL_COMMUNITY): Payer: Self-pay

## 2021-04-21 ENCOUNTER — Telehealth: Payer: No Typology Code available for payment source | Admitting: Physician Assistant

## 2021-04-21 ENCOUNTER — Other Ambulatory Visit: Payer: Self-pay | Admitting: Physician Assistant

## 2021-04-21 DIAGNOSIS — H00019 Hordeolum externum unspecified eye, unspecified eyelid: Secondary | ICD-10-CM

## 2021-04-21 MED ORDER — NEOMYCIN-POLYMYXIN-DEXAMETH 3.5-10000-0.1 OP SUSP
OPHTHALMIC | 0 refills | Status: DC
  Filled 2021-04-21: qty 5, 7d supply, fill #0

## 2021-04-21 MED ORDER — NEOMYCIN-POLYMYXIN-HC 3.5-10000-1 OP SUSP
OPHTHALMIC | 0 refills | Status: DC
  Filled 2021-04-21: qty 7.5, 7d supply, fill #0

## 2021-04-21 NOTE — Progress Notes (Signed)
  E-Visit for Stye   We are sorry that you are not feeling well. Here is how we plan to help!  Based on what you have shared with me it looks like you have a stye.  A stye is an inflammation of the eyelid.  It is often a red, painful lump near the edge of the eyelid that may look like a boil or a pimple.  A stye develops when an infection occurs at the base of an eyelash.   We have made appropriate suggestions for you based upon your presentation: Your symptoms may indicate an infection in addition to a simple stye.  The use of anti-inflammatory and antibiotic eye drops for a week will help resolve this condition.  I have sent in neomycin-polymyxin HC opthalmic suspension, two to three drops in the affected eye every 4 hours.  If your symptoms do not improve over the next two to three days you should be seen in your doctor's office.  HOME CARE:   Wash your hands often!  Let the stye open on its own. Don't squeeze or open it.  Don't rub your eyes. This can irritate your eyes and let in bacteria.  If you need to touch your eyes, wash your hands first.  Don't wear eye makeup or contact lenses until the area has healed.  GET HELP RIGHT AWAY IF:   Your symptoms do not improve.  You develop blurred or loss of vision.  Your symptoms worsen (increased discharge, pain or redness).  Thank you for choosing an e-visit.  Your e-visit answers were reviewed by a board certified advanced clinical practitioner to complete your personal care plan.  Depending upon the condition, your plan could have included both over the counter or prescription medications.  Please review your pharmacy choice.  Make sure the pharmacy is open so you can pick up prescription now.  If there is a problem, you may contact your provider through [employer] and have the prescription routed to another pharmacy.    Your safety is important to [place].  If you have drug allergies check your prescription carefully.  For  the next 24 hours you can use MyChart to ask questions about today's visit, request a non-urgent call back, or ask for a work or school excuse.  You will get an email in the next two days asking about your experience.  I hope you that your e-visit has been valuable and will speed your recovery.

## 2021-04-21 NOTE — Progress Notes (Signed)
I have spent 5 minutes in review of e-visit questionnaire, review and updating patient chart, medical decision making and response to patient.   Terre Hanneman Cody Juan Olthoff, PA-C    

## 2021-05-06 ENCOUNTER — Telehealth: Payer: No Typology Code available for payment source | Admitting: Physician Assistant

## 2021-05-06 DIAGNOSIS — J069 Acute upper respiratory infection, unspecified: Secondary | ICD-10-CM | POA: Diagnosis not present

## 2021-05-06 MED ORDER — BENZONATATE 100 MG PO CAPS: 100.0000 mg | ORAL_CAPSULE | Freq: Three times a day (TID) | ORAL | 0 refills | Status: DC | PRN

## 2021-05-06 MED ORDER — PREDNISONE 10 MG (21) PO TBPK
ORAL_TABLET | ORAL | 0 refills | Status: DC
Start: 2021-05-06 — End: 2021-08-07

## 2021-05-06 NOTE — Progress Notes (Signed)
We are sorry that you are not feeling well.  Here is how we plan to help!  Based on your presentation I believe you most likely have A cough due to a virus.  This is called viral bronchitis and is best treated by rest, plenty of fluids and control of the cough.  You may use Ibuprofen or Tylenol as directed to help your symptoms.     In addition you may use A prescription cough medication called Tessalon Perles 100mg. You may take 1-2 capsules every 8 hours as needed for your cough.  Prednisone 10 mg daily for 6 days (see taper instructions below)  Directions for 6 day taper: Day 1: 2 tablets before breakfast, 1 after both lunch & dinner and 2 at bedtime Day 2: 1 tab before breakfast, 1 after both lunch & dinner and 2 at bedtime Day 3: 1 tab at each meal & 1 at bedtime Day 4: 1 tab at breakfast, 1 at lunch, 1 at bedtime Day 5: 1 tab at breakfast & 1 tab at bedtime Day 6: 1 tab at breakfast   From your responses in the eVisit questionnaire you describe inflammation in the upper respiratory tract which is causing a significant cough.  This is commonly called Bronchitis and has four common causes:    Allergies  Viral Infections  Acid Reflux  Bacterial Infection Allergies, viruses and acid reflux are treated by controlling symptoms or eliminating the cause. An example might be a cough caused by taking certain blood pressure medications. You stop the cough by changing the medication. Another example might be a cough caused by acid reflux. Controlling the reflux helps control the cough.  USE OF BRONCHODILATOR ("RESCUE") INHALERS: There is a risk from using your bronchodilator too frequently.  The risk is that over-reliance on a medication which only relaxes the muscles surrounding the breathing tubes can reduce the effectiveness of medications prescribed to reduce swelling and congestion of the tubes themselves.  Although you feel brief relief from the bronchodilator inhaler, your asthma may  actually be worsening with the tubes becoming more swollen and filled with mucus.  This can delay other crucial treatments, such as oral steroid medications. If you need to use a bronchodilator inhaler daily, several times per day, you should discuss this with your provider.  There are probably better treatments that could be used to keep your asthma under control.     HOME CARE . Only take medications as instructed by your medical team. . Complete the entire course of an antibiotic. . Drink plenty of fluids and get plenty of rest. . Avoid close contacts especially the very young and the elderly . Cover your mouth if you cough or cough into your sleeve. . Always remember to wash your hands . A steam or ultrasonic humidifier can help congestion.   GET HELP RIGHT AWAY IF: . You develop worsening fever. . You become short of breath . You cough up blood. . Your symptoms persist after you have completed your treatment plan MAKE SURE YOU   Understand these instructions.  Will watch your condition.  Will get help right away if you are not doing well or get worse.  Your e-visit answers were reviewed by a board certified advanced clinical practitioner to complete your personal care plan.  Depending on the condition, your plan could have included both over the counter or prescription medications. If there is a problem please reply  once you have received a response from your provider. Your   safety is important to [place].  If you have drug allergies check your prescription carefully.    You can use MyChart to ask questions about today's visit, request a non-urgent call back, or ask for a work or school excuse for 24 hours related to this e-Visit. If it has been greater than 24 hours you will need to follow up with your provider, or enter a new e-Visit to address those concerns. You will get an e-mail in the next two days asking about your experience.  I hope that your e-visit has been valuable and will  speed your recovery. Thank you for using e-visits.  I provided 6 minutes of non face-to-face time during this encounter for chart review and documentation.

## 2021-06-08 ENCOUNTER — Other Ambulatory Visit (HOSPITAL_COMMUNITY): Payer: Self-pay

## 2021-06-08 MED FILL — Atorvastatin Calcium Tab 40 MG (Base Equivalent): ORAL | 90 days supply | Qty: 90 | Fill #0 | Status: AC

## 2021-06-08 MED FILL — Hydrochlorothiazide Tab 25 MG: ORAL | 90 days supply | Qty: 90 | Fill #0 | Status: AC

## 2021-06-09 ENCOUNTER — Other Ambulatory Visit (HOSPITAL_COMMUNITY): Payer: Self-pay

## 2021-06-18 ENCOUNTER — Ambulatory Visit: Payer: No Typology Code available for payment source | Admitting: Physician Assistant

## 2021-06-22 ENCOUNTER — Other Ambulatory Visit (HOSPITAL_COMMUNITY): Payer: Self-pay

## 2021-06-22 MED ORDER — CARESTART COVID-19 HOME TEST VI KIT
PACK | 0 refills | Status: DC
  Filled 2021-06-22: qty 4, 4d supply, fill #0

## 2021-06-24 ENCOUNTER — Encounter: Payer: Self-pay | Admitting: Family Medicine

## 2021-06-24 ENCOUNTER — Ambulatory Visit (INDEPENDENT_AMBULATORY_CARE_PROVIDER_SITE_OTHER): Payer: No Typology Code available for payment source | Admitting: Family Medicine

## 2021-06-24 DIAGNOSIS — U071 COVID-19: Secondary | ICD-10-CM | POA: Diagnosis not present

## 2021-06-24 MED ORDER — MOLNUPIRAVIR EUA 200MG CAPSULE: 4.0000 | ORAL_CAPSULE | Freq: Two times a day (BID) | ORAL | 0 refills | Status: AC

## 2021-06-24 MED ORDER — BENZONATATE 200 MG PO CAPS: 200.0000 mg | ORAL_CAPSULE | Freq: Three times a day (TID) | ORAL | 0 refills | Status: DC | PRN

## 2021-06-24 NOTE — Progress Notes (Signed)
Subjective:    Patient ID: Tanya Wilkinson, female    DOB: [birth year], 60 y.o.   MRN: [id number]   HPI: [name] is a 60 y.o. female presenting for positive CoVID test. Onset cough 2 days ago. Congested. Bad HA. Laying around, no energy. Napping a lot. Decreased appetite. Trying to drink a lot of fluids. Denies fever. No dyspnea. Cough is dry.    Depression screen [facility] 2/9 03/06/2021 08/14/2020 02/26/2020 08/28/2019 01/26/2019  Decreased Interest 0 0 0 0 0  Down, Depressed, Hopeless 0 0 0 0 0  PHQ - 2 Score 0 0 0 0 0  Altered sleeping - - - 0 -  Tired, decreased energy - - - 0 -  Change in appetite - - - 0 -  Feeling bad or failure about yourself  - - - 0 -  Trouble concentrating - - - 0 -  Moving slowly or fidgety/restless - - - 0 -  Suicidal thoughts - - - 0 -  PHQ-9 Score - - - 0 -     Relevant past medical, surgical, family and social history reviewed and updated as indicated.  Interim medical history since our last visit reviewed. Allergies and medications reviewed and updated.  ROS:  Review of Systems  Constitutional:  Positive for activity change, appetite change and fatigue. Negative for chills, diaphoresis and fever.  HENT:  Negative for congestion, ear pain, postnasal drip, rhinorrhea and trouble swallowing.   Respiratory:  Positive for cough. Negative for shortness of breath.   Gastrointestinal:  Negative for abdominal pain.  Musculoskeletal:  Negative for arthralgias.  Skin:  Negative for rash.  Neurological:  Positive for headaches.    Social History   Tobacco Use  Smoking Status Former   Types: Cigarettes   Quit date: 12/06/1994   Years since quitting: 26.5  Smokeless Tobacco Never       Objective:     Wt Readings from Last 3 Encounters:  03/06/21 173 lb (78.5 kg)  01/22/21 171 lb (77.6 kg)  08/14/20 170 lb (77.1 kg)     Exam deferred. Pt. Harboring due to COVID 19. Phone visit performed.   Assessment & Plan:   1. COVID     Meds ordered this  encounter  Medications   benzonatate (TESSALON) 200 MG capsule    Sig: Take 1 capsule (200 mg total) by mouth 3 (three) times daily as needed for cough.    Dispense:  20 capsule    Refill:  0   molnupiravir EUA 200 mg CAPS    Sig: Take 4 capsules (800 mg total) by mouth 2 (two) times daily for 5 days.    Dispense:  40 capsule    Refill:  0    No orders of the defined types were placed in this encounter.     Diagnoses and all orders for this visit:  COVID  Other orders -     benzonatate (TESSALON) 200 MG capsule; Take 1 capsule (200 mg total) by mouth 3 (three) times daily as needed for cough. -     molnupiravir EUA 200 mg CAPS; Take 4 capsules (800 mg total) by mouth 2 (two) times daily for 5 days.   Virtual Visit via telephone Note  I discussed the limitations, risks, security and privacy concerns of performing an evaluation and management service by telephone and the availability of in person appointments. The patient was identified with two identifiers. Pt.expressed understanding and agreed to proceed. Pt. Is at  home. Dr. Livia Snellen is in his office.  Follow Up Instructions:   I discussed the assessment and treatment plan with the patient. The patient was provided an opportunity to ask questions and all were answered. The patient agreed with the plan and demonstrated an understanding of the instructions.   The patient was advised to call back or seek an in-person evaluation if the symptoms worsen or if the condition fails to improve as anticipated.   Total minutes including chart review and phone contact time: 11   Follow up plan: Return if symptoms worsen or fail to improve.  Claretta Fraise, MD [place]

## 2021-08-07 ENCOUNTER — Telehealth: Payer: No Typology Code available for payment source | Admitting: Physician Assistant

## 2021-08-07 DIAGNOSIS — M5442 Lumbago with sciatica, left side: Secondary | ICD-10-CM

## 2021-08-07 DIAGNOSIS — M5441 Lumbago with sciatica, right side: Secondary | ICD-10-CM | POA: Diagnosis not present

## 2021-08-07 MED ORDER — TIZANIDINE HCL 2 MG PO TABS: 4.0000 mg | ORAL_TABLET | Freq: Three times a day (TID) | ORAL | 0 refills | Status: DC | PRN

## 2021-08-07 MED ORDER — NAPROXEN 500 MG PO TABS: 500.0000 mg | ORAL_TABLET | Freq: Two times a day (BID) | ORAL | 0 refills | Status: DC

## 2021-08-07 MED ORDER — PREDNISONE 10 MG PO TABS: ORAL_TABLET | ORAL | 0 refills | Status: DC

## 2021-08-07 NOTE — Progress Notes (Signed)
We are sorry that you are not feeling well.  Here is how we plan to help!  Based on what you have shared with me it looks like you mostly have an acute flare of your chronic back pain. You should schedule an appointment with your doctor in the next week or so to discuss and have a further evaluation.  Acute back pain is defined as musculoskeletal pain that can resolve in 1-3 weeks with conservative treatment.  I have prescribed Naprosyn 500 mg take one by mouth twice a day non-steroid anti-inflammatory (NSAID) as well as Tizanidine 2 mg every eight hours as needed which is a muscle relaxer  Some patients experience stomach irritation or in increased heartburn with anti-inflammatory drugs.  Please keep in mind that muscle relaxer's can cause fatigue and should not be taken while at work or driving.  I will also prescribe a burst of prednisone.  Back pain is very common.  The pain often gets better over time.  The cause of back pain is usually not dangerous.  Most people can learn to manage their back pain on their own.  Home Care Stay active.  Start with short walks on flat ground if you can.  Try to walk farther each day. Do not sit, drive or stand in one place for more than 30 minutes.  Do not stay in bed. Do not avoid exercise or work.  Activity can help your back heal faster. Be careful when you bend or lift an object.  Bend at your knees, keep the object close to you, and do not twist. Sleep on a firm mattress.  Lie on your side, and bend your knees.  If you lie on your back, put a pillow under your knees. Only take medicines as told by your doctor. Put ice on the injured area. Put ice in a plastic bag Place a towel between your skin and the bag Leave the ice on for 15-20 minutes, 3-4 times a day for the first 2-3 days. 210 After that, you can switch between ice and heat packs. Ask your doctor about back exercises or massage. Avoid feeling anxious or stressed.  Find good ways to deal with  stress, such as exercise.  Get Help Right Way If: Your pain does not go away with rest or medicine. Your pain does not go away in 1 week. You have new problems. You do not feel well. The pain spreads into your legs. You cannot control when you poop (bowel movement) or pee (urinate) You feel sick to your stomach (nauseous) or throw up (vomit) You have belly (abdominal) pain. You feel like you may pass out (faint). If you develop a fever.  Make Sure you: Understand these instructions. Will watch your condition Will get help right away if you are not doing well or get worse.  Your e-visit answers were reviewed by a board certified advanced clinical practitioner to complete your personal care plan.  Depending on the condition, your plan could have included both over the counter or prescription medications.  If there is a problem please reply  once you have received a response from your provider.  Your safety is important to [place].  If you have drug allergies check your prescription carefully.    You can use MyChart to ask questions about today's visit, request a non-urgent call back, or ask for a work or school excuse for 24 hours related to this e-Visit. If it has been greater than 24 hours you will  need to follow up with your provider, or enter a new e-Visit to address those concerns.  You will get an e-mail in the next two days asking about your experience.  I hope that your e-visit has been valuable and will speed your recovery. Thank you for using e-visits.  Greater than 5 minutes, yet less than 10 minutes of time have been spent researching, coordinating, and implementing care for this patient today

## 2021-09-08 ENCOUNTER — Other Ambulatory Visit (HOSPITAL_COMMUNITY): Payer: Self-pay

## 2021-09-08 ENCOUNTER — Other Ambulatory Visit: Payer: Self-pay

## 2021-09-08 ENCOUNTER — Ambulatory Visit (INDEPENDENT_AMBULATORY_CARE_PROVIDER_SITE_OTHER): Payer: No Typology Code available for payment source | Admitting: Nurse Practitioner

## 2021-09-08 ENCOUNTER — Encounter: Payer: Self-pay | Admitting: Nurse Practitioner

## 2021-09-08 VITALS — BP 135/80 | HR 87 | Temp 97.6°F | Resp 20 | Ht 62.0 in | Wt 167.0 lb

## 2021-09-08 DIAGNOSIS — M8580 Other specified disorders of bone density and structure, unspecified site: Secondary | ICD-10-CM | POA: Diagnosis not present

## 2021-09-08 DIAGNOSIS — M545 Low back pain, unspecified: Secondary | ICD-10-CM

## 2021-09-08 DIAGNOSIS — Z23 Encounter for immunization: Secondary | ICD-10-CM

## 2021-09-08 DIAGNOSIS — E785 Hyperlipidemia, unspecified: Secondary | ICD-10-CM | POA: Diagnosis not present

## 2021-09-08 DIAGNOSIS — R7989 Other specified abnormal findings of blood chemistry: Secondary | ICD-10-CM

## 2021-09-08 DIAGNOSIS — E8881 Metabolic syndrome: Secondary | ICD-10-CM | POA: Diagnosis not present

## 2021-09-08 DIAGNOSIS — I10 Essential (primary) hypertension: Secondary | ICD-10-CM | POA: Diagnosis not present

## 2021-09-08 DIAGNOSIS — Z6830 Body mass index (BMI) 30.0-30.9, adult: Secondary | ICD-10-CM

## 2021-09-08 LAB — BAYER DCA HB A1C WAIVED: HB A1C (BAYER DCA - WAIVED): 5.6 % (ref 4.8–5.6)

## 2021-09-08 MED ORDER — HYDROCHLOROTHIAZIDE 25 MG PO TABS
ORAL_TABLET | Freq: Every day | ORAL | 1 refills | Status: DC
  Filled 2021-09-08: qty 90, 90d supply, fill #0
  Filled 2022-01-16: qty 90, 90d supply, fill #1

## 2021-09-08 MED ORDER — PREDNISONE 10 MG (21) PO TBPK
ORAL_TABLET | ORAL | 0 refills | Status: DC
  Filled 2021-09-08: qty 21, 6d supply, fill #0

## 2021-09-08 MED ORDER — CYCLOBENZAPRINE HCL 10 MG PO TABS
10.0000 mg | ORAL_TABLET | Freq: Three times a day (TID) | ORAL | 1 refills | Status: DC | PRN
  Filled 2021-09-08: qty 30, 10d supply, fill #0

## 2021-09-08 MED ORDER — OMEGA-3-ACID ETHYL ESTERS 1 G PO CAPS
1.0000 | ORAL_CAPSULE | Freq: Two times a day (BID) | ORAL | 1 refills | Status: DC
  Filled 2021-09-08: qty 180, 90d supply, fill #0

## 2021-09-08 MED ORDER — FENOFIBRATE 145 MG PO TABS
145.0000 mg | ORAL_TABLET | Freq: Every day | ORAL | 1 refills | Status: DC
  Filled 2021-09-08: qty 90, 90d supply, fill #0
  Filled 2022-01-16: qty 90, 90d supply, fill #1

## 2021-09-08 MED ORDER — ATORVASTATIN CALCIUM 40 MG PO TABS
ORAL_TABLET | Freq: Every day | ORAL | 1 refills | Status: DC
  Filled 2021-09-08: qty 90, 90d supply, fill #0
  Filled 2022-01-16: qty 90, 90d supply, fill #1

## 2021-09-08 NOTE — Patient Instructions (Signed)

## 2021-09-08 NOTE — Progress Notes (Signed)
Subjective:    Patient ID: Tanya Wilkinson, female    DOB: June 22, 1961, 60 y.o.   MRN: [id number]   Chief Complaint: medical management of chronic issues     HPI:  1. Essential hypertension No c/o chest pain, sob or headache. Does  check blood pressure at home. At home has been running 543-606 systolic. BP Readings from Last 3 Encounters:  09/08/21 135/80  04/03/21 (!) 171/92  03/06/21 (!) 142/90      2. Hyperlipidemia with target LDL less than 100 Does ot really wtahc diet and does o dedicated exercise. Lab Results  Component Value Date   CHOL 183 03/06/2021   HDL 50 03/06/2021   LDLCALC 111 (H) 03/06/2021   TRIG 126 03/06/2021   CHOLHDL 3.7 03/06/2021   The 10-year ASCVD risk score (Arnett DK, et al., 2019) is: 6%   Values used to calculate the score:     Age: 32 years     Sex: Female     Is Non-Hispanic African American: No     Diabetic: No     Tobacco smoker: No     Systolic Blood Pressure: 770 mmHg     Is BP treated: Yes     HDL Cholesterol: 50 mg/dL     Total Cholesterol: 183 mg/dL   3. Metabolic syndrome Does not check blood sugars at home. Lab Results  Component Value Date   HGBA1C 5.7 08/28/2019     4. Osteopenia, unspecified location Does no weight bearing exercise. Last dexascan was done  01/06/21 with tscore of -2.1.  5. Low serum vitamin D She is on a daily vitamin d supplement  6. BMI 31.0-31.9,adult Weight is down 6lbs Wt Readings from Last 3 Encounters:  09/08/21 167 lb (75.8 kg)  03/06/21 173 lb (78.5 kg)  01/22/21 171 lb (77.6 kg)   BMI Readings from Last 3 Encounters:  09/08/21 30.54 kg/m  03/06/21 31.64 kg/m  01/22/21 30.53 kg/m      Outpatient Encounter Medications as of 09/08/2021  Medication Sig   atorvastatin (LIPITOR) 40 MG tablet TAKE 1 TABLET BY MOUTH ONCE A DAY   benzonatate (TESSALON) 200 MG capsule Take 1 capsule (200 mg total) by mouth 3 (three) times daily as needed for cough.   CALCIUM PO Take by mouth.    Cholecalciferol (VITAMIN D) 2000 units CAPS Take by mouth.   COVID-19 At Home Antigen Test [facility] COVID-19 HOME TEST) KIT Use as directed   ELDERBERRY PO Take by mouth. + zinc & vitamin c   fluticasone (FLONASE) 50 MCG/ACT nasal spray Place 2 sprays into both nostrils daily.   hydrochlorothiazide (HYDRODIURIL) 25 MG tablet TAKE 1 TABLET BY MOUTH ONCE A DAY   hydrOXYzine (ATARAX/VISTARIL) 25 MG tablet Take 0.5-1 tablets (12.5-25 mg total) by mouth every 8 (eight) hours as needed for itching.   Multiple Vitamins-Minerals (CENTRUM SILVER PO) Take 1 tablet by mouth daily.   naproxen (NAPROSYN) 500 MG tablet Take 1 tablet (500 mg total) by mouth 2 (two) times daily with a meal.   omega-3 acid ethyl esters (LOVAZA) 1 g capsule TAKE 1 CAPSULE BY MOUTH TWICE DAILY   predniSONE (DELTASONE) 10 MG tablet Day 1: 2 tablets before breakfast, 1 after both lunch & dinner and 2 at bedtime; Day 2: 1 tab before breakfast, 1 after both lunch & dinner and 2 at bedtime; Day 3: 1 tab at each meal & 1 at bedtime; Day 4: 1 tab at breakfast, 1 at lunch, 1 at bedtime;  Day 5: 1 tab at breakfast & 1 tab at bedtime; Day 6: 1 tab at breakfast   tiZANidine (ZANAFLEX) 2 MG tablet Take 2 tablets (4 mg total) by mouth every 8 (eight) hours as needed for muscle spasms.   triamcinolone cream (KENALOG) 0.1 % Apply 1 application topically 2 (two) times daily.   No facility-administered encounter medications on file as of 09/08/2021.    Past Surgical History:  Procedure Laterality Date   ANTERIOR CERVICAL DECOMP/DISCECTOMY FUSION  08/2006   CESAREAN SECTION  [id number]   COLONOSCOPY  2002   [facility] in [place]  [id number]   BTL    Family History  Problem Relation Age of Onset   Diabetes Mother    Hypertension Mother    Kidney disease Mother    Cancer Father        throat & lung-smoker   Stroke Father    Stroke Brother    Hypertension Brother    Cancer Brother 71       bladder   Stroke Brother     Early death Brother 20   Stroke Son    Hypertension Son    Colon cancer Neg Hx    Pancreatic cancer Neg Hx    Stomach cancer Neg Hx    Esophageal cancer Neg Hx    Breast cancer Neg Hx     New complaints: -Had covid in June. Is doing well. - did evisit for back pain in September and was given steroids and muscle relaxer. Is better but still bothering her some. Rates pain 6/10 this morning. Sitting to long may increase pain. Social history: Lives with husband. Works for [employer]  substance contract: n/a     Review of Systems  Constitutional:  Negative for diaphoresis.  Eyes:  Negative for pain.  Respiratory:  Negative for shortness of breath.   Cardiovascular:  Negative for chest pain, palpitations and leg swelling.  Gastrointestinal:  Negative for abdominal pain.  Endocrine: Negative for polydipsia.  Skin:  Negative for rash.  Neurological:  Negative for dizziness, weakness and headaches.  Hematological:  Does not bruise/bleed easily.  All other systems reviewed and are negative.     Objective:   Physical Exam Vitals and nursing note reviewed.  Constitutional:      General: She is not in acute distress.    Appearance: Normal appearance. She is well-developed.  HENT:     Head: Normocephalic.     Right Ear: Tympanic membrane normal.     Left Ear: Tympanic membrane normal.     Nose: Nose normal.     Mouth/Throat:     Mouth: Mucous membranes are moist.  Eyes:     Pupils: Pupils are equal, round, and reactive to light.  Neck:     Vascular: No carotid bruit or JVD.  Cardiovascular:     Rate and Rhythm: Normal rate and regular rhythm.     Heart sounds: Normal heart sounds.  Pulmonary:     Effort: Pulmonary effort is normal. No respiratory distress.     Breath sounds: Normal breath sounds. No wheezing or rales.  Chest:     Chest wall: No tenderness.  Abdominal:     General: Bowel sounds are normal. There is no distension or abdominal bruit.      Palpations: Abdomen is soft. There is no hepatomegaly, splenomegaly, mass or pulsatile mass.     Tenderness: There is no abdominal tenderness.  Musculoskeletal:  General: Normal range of motion.     Cervical back: Normal range of motion and neck supple.  Lymphadenopathy:     Cervical: No cervical adenopathy.  Skin:    General: Skin is warm and dry.  Neurological:     Mental Status: She is alert and oriented to person, place, and time.     Deep Tendon Reflexes: Reflexes are normal and symmetric.  Psychiatric:        Behavior: Behavior normal.        Thought Content: Thought content normal.        Judgment: Judgment normal.   BP 135/80 Comment: home  Pulse 87   Temp 97.6 F (36.4 C) (Temporal)   Resp 20   Ht 5' 2" (1.575 m)   Wt 167 lb (75.8 kg)   LMP 12/07/2007   SpO2 97%   BMI 30.54 kg/m         Assessment & Plan:  Tanya Wilkinson comes in today with chief complaint of Medical Management of Chronic Issues   Diagnosis and orders addressed:  1. Essential hypertension Low sodium diet - hydrochlorothiazide (HYDRODIURIL) 25 MG tablet; TAKE 1 TABLET BY MOUTH ONCE A DAY  Dispense: 90 tablet; Refill: 1 - CBC with Differential/Platelet - CMP14+EGFR  2. Hyperlipidemia with target LDL less than 100 Low fat diet - atorvastatin (LIPITOR) 40 MG tablet; TAKE 1 TABLET BY MOUTH ONCE A DAY  Dispense: 90 tablet; Refill: 1 - omega-3 acid ethyl esters (LOVAZA) 1 g capsule; TAKE 1 CAPSULE BY MOUTH TWICE DAILY  Dispense: 180 capsule; Refill: 1 - Lipid panel Lovaza changed to fenofibrate 128m due to insurance 3. Metabolic syndrome Watch carbs in diet - Bayer DCA Hb A1c Waived  4. Osteopenia, unspecified location Weight bearing exercise encouraged  5. Low serum vitamin D Continue daily vitamin d supplement - VITAMIN D 25 Hydroxy (Vit-D Deficiency, Fractures)  6. BMI 30.0-30.9,adult Discussed diet and exercise for person with BMI >25 Will recheck weight in 3-6  months   7. Acute left-sided low back pain without sciatica Moist heat rest - predniSONE (STERAPRED UNI-PAK 21 TAB) 10 MG (21) TBPK tablet; As directed x 6 days  Dispense: 21 tablet; Refill: 0 - cyclobenzaprine (FLEXERIL) 10 MG tablet; Take 1 tablet (10 mg total) by mouth 3 (three) times daily as needed for muscle spasms.  Dispense: 30 tablet; Refill: 1   Labs pending Health Maintenance reviewed Diet and exercise encouraged  Follow up plan: 6 months   Mary-Margaret MHassell Done FNP

## 2021-09-09 LAB — CMP14+EGFR
ALT: 21 IU/L (ref 0–32)
AST: 22 IU/L (ref 0–40)
Albumin/Globulin Ratio: 2 (ref 1.2–2.2)
Albumin: 4.6 g/dL (ref 3.8–4.8)
Alkaline Phosphatase: 93 IU/L (ref 44–121)
BUN/Creatinine Ratio: 13 (ref 12–28)
BUN: 9 mg/dL (ref 8–27)
Bilirubin Total: 0.5 mg/dL (ref 0.0–1.2)
CO2: 26 mmol/L (ref 20–29)
Calcium: 10.1 mg/dL (ref 8.7–10.3)
Chloride: 96 mmol/L (ref 96–106)
Creatinine, Ser: 0.71 mg/dL (ref 0.57–1.00)
Globulin, Total: 2.3 g/dL (ref 1.5–4.5)
Glucose: 108 mg/dL — ABNORMAL HIGH (ref 70–99)
Potassium: 4.4 mmol/L (ref 3.5–5.2)
Sodium: 138 mmol/L (ref 134–144)
Total Protein: 6.9 g/dL (ref 6.0–8.5)
eGFR: 96 mL/min/{1.73_m2} (ref >59)

## 2021-09-09 LAB — CBC WITH DIFFERENTIAL/PLATELET
Basophils Absolute: 0 10*3/uL (ref 0.0–0.2)
Basos: 1 %
EOS (ABSOLUTE): 0.1 10*3/uL (ref 0.0–0.4)
Eos: 3 %
Hematocrit: 38.7 % (ref 34.0–46.6)
Hemoglobin: 13 g/dL (ref 11.1–15.9)
Immature Grans (Abs): 0 10*3/uL (ref 0.0–0.1)
Immature Granulocytes: 0 %
Lymphocytes Absolute: 1.5 10*3/uL (ref 0.7–3.1)
Lymphs: 27 %
MCH: 30.6 pg (ref 26.6–33.0)
MCHC: 33.6 g/dL (ref 31.5–35.7)
MCV: 91 fL (ref 79–97)
Monocytes Absolute: 0.5 10*3/uL (ref 0.1–0.9)
Monocytes: 9 %
Neutrophils Absolute: 3.2 10*3/uL (ref 1.4–7.0)
Neutrophils: 60 %
Platelets: 300 10*3/uL (ref 150–450)
RBC: 4.25 x10E6/uL (ref 3.77–5.28)
RDW: 11.8 % (ref 11.7–15.4)
WBC: 5.3 10*3/uL (ref 3.4–10.8)

## 2021-09-09 LAB — VITAMIN D 25 HYDROXY (VIT D DEFICIENCY, FRACTURES): Vit D, 25-Hydroxy: 24.4 ng/mL — ABNORMAL LOW (ref 30.0–100.0)

## 2021-09-09 LAB — LIPID PANEL
Chol/HDL Ratio: 4.1 ratio (ref 0.0–4.4)
Cholesterol, Total: 194 mg/dL (ref 100–199)
HDL: 47 mg/dL (ref >39)
LDL Chol Calc (NIH): 115 mg/dL — ABNORMAL HIGH (ref 0–99)
Triglycerides: 182 mg/dL — ABNORMAL HIGH (ref 0–149)
VLDL Cholesterol Cal: 32 mg/dL (ref 5–40)

## 2021-09-10 ENCOUNTER — Other Ambulatory Visit (HOSPITAL_COMMUNITY): Payer: Self-pay

## 2021-09-14 ENCOUNTER — Telehealth: Payer: Self-pay | Admitting: Nurse Practitioner

## 2021-09-14 NOTE — Telephone Encounter (Signed)
Patient aware that copy of immunization will be mailed.

## 2021-09-18 ENCOUNTER — Telehealth: Payer: Self-pay | Admitting: Nurse Practitioner

## 2021-09-18 NOTE — Telephone Encounter (Signed)
Will send to medical records

## 2021-09-18 NOTE — Telephone Encounter (Signed)
Information emailed to patient by Allayne Butcher, patient aware.

## 2021-09-18 NOTE — Telephone Encounter (Signed)
Pt said they need to know which arm she had it in, the lot number, date, and the name has to be typed

## 2021-11-15 ENCOUNTER — Telehealth: Payer: No Typology Code available for payment source | Admitting: Emergency Medicine

## 2021-11-15 DIAGNOSIS — K649 Unspecified hemorrhoids: Secondary | ICD-10-CM

## 2021-11-15 MED ORDER — HYDROCORTISONE ACETATE 25 MG RE SUPP: 25.0000 mg | Freq: Two times a day (BID) | RECTAL | 0 refills | Status: DC

## 2021-11-15 NOTE — Progress Notes (Signed)
I have spent 5 minutes in review of e-visit questionnaire, review and updating patient chart, medical decision making and response to patient.   Codey Burling, PA-C    

## 2021-11-15 NOTE — Progress Notes (Signed)

## 2021-12-18 ENCOUNTER — Ambulatory Visit: Payer: No Typology Code available for payment source | Admitting: Nurse Practitioner

## 2022-01-16 ENCOUNTER — Other Ambulatory Visit (HOSPITAL_COMMUNITY): Payer: Self-pay

## 2022-02-16 ENCOUNTER — Other Ambulatory Visit: Payer: Self-pay | Admitting: Nurse Practitioner

## 2022-02-16 DIAGNOSIS — Z1231 Encounter for screening mammogram for malignant neoplasm of breast: Secondary | ICD-10-CM

## 2022-02-24 ENCOUNTER — Ambulatory Visit: Payer: No Typology Code available for payment source

## 2022-03-09 ENCOUNTER — Other Ambulatory Visit (HOSPITAL_COMMUNITY): Payer: Self-pay

## 2022-03-09 ENCOUNTER — Ambulatory Visit (INDEPENDENT_AMBULATORY_CARE_PROVIDER_SITE_OTHER): Payer: No Typology Code available for payment source | Admitting: Nurse Practitioner

## 2022-03-09 ENCOUNTER — Ambulatory Visit (INDEPENDENT_AMBULATORY_CARE_PROVIDER_SITE_OTHER): Payer: No Typology Code available for payment source

## 2022-03-09 ENCOUNTER — Encounter: Payer: Self-pay | Admitting: Nurse Practitioner

## 2022-03-09 VITALS — BP 137/79 | HR 81 | Temp 98.7°F | Resp 20 | Ht 62.0 in | Wt 165.0 lb

## 2022-03-09 DIAGNOSIS — Z6828 Body mass index (BMI) 28.0-28.9, adult: Secondary | ICD-10-CM

## 2022-03-09 DIAGNOSIS — E785 Hyperlipidemia, unspecified: Secondary | ICD-10-CM

## 2022-03-09 DIAGNOSIS — I10 Essential (primary) hypertension: Secondary | ICD-10-CM

## 2022-03-09 DIAGNOSIS — E8881 Metabolic syndrome: Secondary | ICD-10-CM

## 2022-03-09 DIAGNOSIS — H9193 Unspecified hearing loss, bilateral: Secondary | ICD-10-CM

## 2022-03-09 DIAGNOSIS — R7989 Other specified abnormal findings of blood chemistry: Secondary | ICD-10-CM | POA: Diagnosis not present

## 2022-03-09 DIAGNOSIS — M8588 Other specified disorders of bone density and structure, other site: Secondary | ICD-10-CM | POA: Diagnosis not present

## 2022-03-09 DIAGNOSIS — M8580 Other specified disorders of bone density and structure, unspecified site: Secondary | ICD-10-CM

## 2022-03-09 LAB — CMP14+EGFR
ALT: 16 IU/L (ref 0–32)
AST: 19 IU/L (ref 0–40)
Albumin/Globulin Ratio: 2.1 (ref 1.2–2.2)
Albumin: 4.6 g/dL (ref 3.8–4.8)
Alkaline Phosphatase: 95 IU/L (ref 44–121)
BUN/Creatinine Ratio: 14 (ref 12–28)
BUN: 10 mg/dL (ref 8–27)
Bilirubin Total: 0.5 mg/dL (ref 0.0–1.2)
CO2: 28 mmol/L (ref 20–29)
Calcium: 9.8 mg/dL (ref 8.7–10.3)
Chloride: 98 mmol/L (ref 96–106)
Creatinine, Ser: 0.73 mg/dL (ref 0.57–1.00)
Globulin, Total: 2.2 g/dL (ref 1.5–4.5)
Glucose: 110 mg/dL — ABNORMAL HIGH (ref 70–99)
Potassium: 4.6 mmol/L (ref 3.5–5.2)
Sodium: 141 mmol/L (ref 134–144)
Total Protein: 6.8 g/dL (ref 6.0–8.5)
eGFR: 93 mL/min/{1.73_m2} (ref >59)

## 2022-03-09 LAB — CBC WITH DIFFERENTIAL/PLATELET
Basophils Absolute: 0 10*3/uL (ref 0.0–0.2)
Basos: 1 %
EOS (ABSOLUTE): 0.1 10*3/uL (ref 0.0–0.4)
Eos: 2 %
Hematocrit: 38.2 % (ref 34.0–46.6)
Hemoglobin: 13.2 g/dL (ref 11.1–15.9)
Immature Grans (Abs): 0 10*3/uL (ref 0.0–0.1)
Immature Granulocytes: 0 %
Lymphocytes Absolute: 1.6 10*3/uL (ref 0.7–3.1)
Lymphs: 28 %
MCH: 31 pg (ref 26.6–33.0)
MCHC: 34.6 g/dL (ref 31.5–35.7)
MCV: 90 fL (ref 79–97)
Monocytes Absolute: 0.4 10*3/uL (ref 0.1–0.9)
Monocytes: 7 %
Neutrophils Absolute: 3.4 10*3/uL (ref 1.4–7.0)
Neutrophils: 62 %
Platelets: 286 10*3/uL (ref 150–450)
RBC: 4.26 x10E6/uL (ref 3.77–5.28)
RDW: 11.8 % (ref 11.7–15.4)
WBC: 5.6 10*3/uL (ref 3.4–10.8)

## 2022-03-09 LAB — LIPID PANEL
Chol/HDL Ratio: 3.8 ratio (ref 0.0–4.4)
Cholesterol, Total: 184 mg/dL (ref 100–199)
HDL: 49 mg/dL (ref >39)
LDL Chol Calc (NIH): 99 mg/dL (ref 0–99)
Triglycerides: 213 mg/dL — ABNORMAL HIGH (ref 0–149)
VLDL Cholesterol Cal: 36 mg/dL (ref 5–40)

## 2022-03-09 MED ORDER — HYDROCHLOROTHIAZIDE 25 MG PO TABS
ORAL_TABLET | Freq: Every day | ORAL | 1 refills | Status: DC
  Filled 2022-03-09: qty 90, fill #0
  Filled 2022-05-01: qty 90, 90d supply, fill #0
  Filled 2022-09-09: qty 90, 90d supply, fill #1

## 2022-03-09 MED ORDER — ATORVASTATIN CALCIUM 40 MG PO TABS
ORAL_TABLET | Freq: Every day | ORAL | 1 refills | Status: DC
  Filled 2022-03-09: qty 90, fill #0
  Filled 2022-05-01: qty 90, 90d supply, fill #0
  Filled 2022-09-09: qty 90, 90d supply, fill #1

## 2022-03-09 MED ORDER — FENOFIBRATE 145 MG PO TABS
145.0000 mg | ORAL_TABLET | Freq: Every day | ORAL | 1 refills | Status: DC
  Filled 2022-03-09 – 2022-05-01 (×2): qty 90, 90d supply, fill #0
  Filled 2022-09-09: qty 90, 90d supply, fill #1

## 2022-03-09 NOTE — Progress Notes (Signed)
? ?Subjective:  ? ? Patient ID: Tanya Wilkinson, female    DOB: Feb 18, 1961, 61 y.o.   MRN: [id number] ? ? ?Chief Complaint: medical management of chronic issues  ?  ?HPI: ? ?Tanya Wilkinson is a 61 y.o. who identifies as a female who was assigned female at birth.  ? ?Social history: ?Lives with: husband ?Work history: works for [employer] ? ? ?Comes in today for follow up of the following chronic medical issues: ? ?1. Essential hypertension ?No c/o chest pain, sob or headache. Does not check blood pressure at home. ?BP Readings from Last 3 Encounters:  ?09/08/21 135/80  ?04/03/21 (!) 171/92  ?03/06/21 (!) 142/90  ? ? ? ?2. Hyperlipidemia with target LDL less than 100 ?Does try to watch diet and does occasional exercise. ?Lab Results  ?Component Value Date  ? CHOL 194 09/08/2021  ? HDL 47 09/08/2021  ? LDLCALC 115 (H) 09/08/2021  ? TRIG 182 (H) 09/08/2021  ? CHOLHDL 4.1 09/08/2021  ? ?The 10-year ASCVD risk score (Arnett DK, et al., 2019) is: 6.7% ? ? ?3. Metabolic syndrome ?She does not check her bloos sugars at home. She does watch sugars in diet ? ?4. Low serum vitamin D ?Is on daily vitamin d supplement ? ?5. Osteopenia, unspecified location ?Last dexascan was done on 01/10/20 with t score of -2.1. she does notr o alt of weight bearing exercises. ? ?6. BMI 28.0-28.9,adult ?No recent weight changes ?Wt Readings from Last 3 Encounters:  ?03/09/22 165 lb (74.8 kg)  ?09/08/21 167 lb (75.8 kg)  ?03/06/21 173 lb (78.5 kg)  ? ?BMI Readings from Last 3 Encounters:  ?03/09/22 30.18 kg/m?  ?09/08/21 30.54 kg/m?  ?03/06/21 31.64 kg/m?  ? ? ? ? ?New complaints: ?None today ? ?Allergies  ?Allergen Reactions  ? Morphine And Related Itching  ? ?Outpatient Encounter Medications as of 03/09/2022  ?Medication Sig  ? atorvastatin (LIPITOR) 40 MG tablet TAKE 1 TABLET BY MOUTH ONCE A DAY  ? CALCIUM PO Take by mouth.  ? Cholecalciferol (VITAMIN D) 2000 units CAPS Take by mouth.  ? cyclobenzaprine (FLEXERIL) 10 MG tablet Take 1 tablet (10 mg  total) by mouth 3 (three) times daily as needed for muscle spasms.  ? ELDERBERRY PO Take by mouth. + zinc & vitamin c  ? fenofibrate (TRICOR) 145 MG tablet Take 1 tablet (145 mg total) by mouth daily.  ? fluticasone (FLONASE) 50 MCG/ACT nasal spray Place 2 sprays into both nostrils daily. (Patient not taking: Reported on 09/08/2021)  ? hydrochlorothiazide (HYDRODIURIL) 25 MG tablet TAKE 1 TABLET BY MOUTH ONCE A DAY  ? hydrocortisone (ANUSOL-HC) 25 MG suppository Place 1 suppository (25 mg total) rectally 2 (two) times daily.  ? Multiple Vitamins-Minerals (CENTRUM SILVER PO) Take 1 tablet by mouth daily.  ? naproxen (NAPROSYN) 500 MG tablet Take 1 tablet (500 mg total) by mouth 2 (two) times daily with a meal.  ? predniSONE (STERAPRED UNI-PAK 21 TAB) 10 MG (21) TBPK tablet Take as directed for 6 days  ? triamcinolone cream (KENALOG) 0.1 % Apply 1 application topically 2 (two) times daily.  ? ?No facility-administered encounter medications on file as of 03/09/2022.  ? ? ?Past Surgical History:  ?Procedure Laterality Date  ? ANTERIOR CERVICAL DECOMP/DISCECTOMY FUSION  08/2006  ? CESAREAN SECTION  [id number]  ? COLONOSCOPY  2002  ? Hospital in [facility]   ? TUBAL LIGATION  [phone]  ? BTL  ? ? ?Family History  ?Problem Relation Age of Onset  ?  Diabetes Mother   ? Hypertension Mother   ? Kidney disease Mother   ? Cancer Father   ?     throat & lung-smoker  ? Stroke Father   ? Stroke Brother   ? Hypertension Brother   ? Cancer Brother 50  ?     bladder  ? Stroke Brother   ? Early death Brother 35  ? Stroke Son   ? Hypertension Son   ? Colon cancer Neg Hx   ? Pancreatic cancer Neg Hx   ? Stomach cancer Neg Hx   ? Esophageal cancer Neg Hx   ? Breast cancer Neg Hx   ? ? ? ? ?Controlled substance contract: n/a ? ? ? ? ? ?Review of Systems  ?Constitutional:  Negative for diaphoresis.  ?Eyes:  Negative for pain.  ?Respiratory:  Negative for shortness of breath.   ?Cardiovascular:  Negative for chest pain, palpitations and leg  swelling.  ?Gastrointestinal:  Negative for abdominal pain.  ?Endocrine: Negative for polydipsia.  ?Skin:  Negative for rash.  ?Neurological:  Negative for dizziness, weakness and headaches.  ?Hematological:  Does not bruise/bleed easily.  ?All other systems reviewed and are negative. ? ?   ?Objective:  ? Physical Exam ?Vitals and nursing note reviewed.  ?Constitutional:   ?   General: She is not in acute distress. ?   Appearance: Normal appearance. She is well-developed.  ?HENT:  ?   Head: Normocephalic.  ?   Right Ear: Tympanic membrane normal.  ?   Left Ear: Tympanic membrane normal.  ?   Nose: Nose normal.  ?   Mouth/Throat:  ?   Mouth: Mucous membranes are moist.  ?Eyes:  ?   Pupils: Pupils are equal, round, and reactive to light.  ?Neck:  ?   Vascular: No carotid bruit or JVD.  ?Cardiovascular:  ?   Rate and Rhythm: Normal rate and regular rhythm.  ?   Heart sounds: Normal heart sounds.  ?Pulmonary:  ?   Effort: Pulmonary effort is normal. No respiratory distress.  ?   Breath sounds: Normal breath sounds. No wheezing or rales.  ?Chest:  ?   Chest wall: No tenderness.  ?Abdominal:  ?   General: Bowel sounds are normal. There is no distension or abdominal bruit.  ?   Palpations: Abdomen is soft. There is no hepatomegaly, splenomegaly, mass or pulsatile mass.  ?   Tenderness: There is no abdominal tenderness.  ?Musculoskeletal:     ?   General: Normal range of motion.  ?   Cervical back: Normal range of motion and neck supple.  ?Lymphadenopathy:  ?   Cervical: No cervical adenopathy.  ?Skin: ?   General: Skin is warm and dry.  ?Neurological:  ?   Mental Status: She is alert and oriented to person, place, and time.  ?   Deep Tendon Reflexes: Reflexes are normal and symmetric.  ?Psychiatric:     ?   Behavior: Behavior normal.     ?   Thought Content: Thought content normal.     ?   Judgment: Judgment normal.  ? ? ?BP 137/79   Pulse 81   Temp 98.7 ?F (37.1 ?C) (Temporal)   Resp 20   Ht _0  (1.575 m)   Wt 165  lb (74.8 kg)   LMP 12/07/2007   SpO2 99%   BMI 30.18 kg/m?  ? ? ? ?   ?Assessment & Plan:  ? ?Tanya Wilkinson comes in today with chief  complaint of Medical Management of Chronic Issues (Swollen area to neck) ? ? ?Diagnosis and orders addressed: ? ?1. Essential hypertension ?Low sodium diet ?- hydrochlorothiazide (HYDRODIURIL) 25 MG tablet; TAKE 1 TABLET BY MOUTH ONCE A DAY  Dispense: 90 tablet; Refill: 1 ?- CBC with Differential/Platelet ?- CMP14+EGFR ? ?2. Hyperlipidemia with target LDL less than 100 ?Low fat diet ?- atorvastatin (LIPITOR) 40 MG tablet; TAKE 1 TABLET BY MOUTH ONCE A DAY  Dispense: 90 tablet; Refill: 1 ?- fenofibrate (TRICOR) 145 MG tablet; Take 1 tablet (145 mg total) by mouth daily.  Dispense: 90 tablet; Refill: 1 ?- Lipid panel ? ?3. Metabolic syndrome ?Watch carbs in diet ? ?4. Low serum vitamin D ?Continue vitamin d supplement ? ?5. Osteopenia, unspecified location ?Weight bearing execises encouraged ? ?6. BMI 28.0-28.9,adult ?Discussed diet and exercise for person with BMI >25 ?Will recheck weight in 3-6 months ? ? ? ?Labs pending ?Health Maintenance reviewed ?Diet and exercise encouraged ? ?Follow up plan: ?6 months ? ? ?Mary-Margaret Hassell Done, FNP ? ?

## 2022-03-09 NOTE — Patient Instructions (Signed)
Bone Health ?Bones protect organs, store calcium, anchor muscles, and support the whole body. Keeping your bones strong is important, especially as you get older. You can take actions to help keep your bones strong and healthy. ?Why is keeping my bones healthy important? ?Keeping your bones healthy is important because your body constantly replaces bone cells. Cells get old, and new cells take their place. As we age, we lose bone cells because the body may not be able to make enough new cells to replace the old cells. The amount of bone cells and bone tissue you have is referred to as bone mass. The higher your bone mass, the stronger your bones. ?The aging process leads to an overall loss of bone mass in the body, which can increase the likelihood of: ?Broken bones. ?A condition in which the bones become weak and brittle (osteoporosis). ?A large decline in bone mass occurs in older adults. In women, it occurs about the time of menopause. ?What actions can I take to keep my bones healthy? ?Good health habits are important for maintaining healthy bones. This includes eating nutritious foods and exercising regularly. To have healthy bones, you need to get enough of the right minerals and vitamins. Most nutrition experts recommend getting these nutrients from the foods that you eat. In some cases, taking supplements may also be recommended. Doing certain types of exercise is also important for bone health. ?What are the nutritional recommendations for healthy bones? ?Eating a well-balanced diet with plenty of calcium and vitamin D will help to protect your bones. Nutritional recommendations vary from person to person. Ask your health care provider what is healthy for you. Here are some general guidelines. ?Get enough calcium ?Calcium is the most important (essential) mineral for bone health. Most people can get enough calcium from their diet, but supplements may be recommended for people who are at risk for  osteoporosis. Good sources of calcium include: ?Dairy products, such as low-fat or nonfat milk, cheese, and yogurt. ?Dark green leafy vegetables, such as bok choy and broccoli. ?Foods that have calcium added to them (are fortified). Foods that may be fortified with calcium include orange juice, cereal, bread, soy beverages, and tofu products. ?Nuts, such as almonds. ?Follow these recommended amounts for daily calcium intake: ?Infants, 0-6 months: 200 mg. ?Infants, 6-12 months: 260 mg. ?Children, age 759-3: 700 mg. ?Children, age 68-8: 1,000 mg. ?Children, age 31-13: 1,300 mg. ?Teens, age 80-18: 1,300 mg. ?Adults, age 83-50: 1,000 mg. ?Adults, age 98-70: ?Men: 1,000 mg. ?Women: 1,200 mg. ?Adults, age 30 or older: 1,200 mg. ?Pregnant and breastfeeding females: ?Teens: 1,300 mg. ?Adults: 1,000 mg. ?Get enough vitamin D ?Vitamin D is the most essential vitamin for bone health. It helps the body absorb calcium. Sunlight stimulates the skin to make vitamin D, so be sure to get enough sunlight. If you live in a cold climate or you do not get outside often, your health care provider may recommend that you take vitamin D supplements. Good sources of vitamin D in your diet include: ?Egg yolks. ?Saltwater fish. ?Milk and cereal fortified with vitamin D. ?Follow these recommended amounts for daily vitamin D intake: ?Infants, 0-12 months: 400 international units (IU). ?Children and teens, age 759-18: 63 international units. ?Adults, age 759 or younger: 49 international units. ?Adults, age 64 or older: 67-1,000 international units. ?Get other important nutrients ?Other nutrients that are important for bone health include: ?Phosphorus. This mineral is found in meat, poultry, dairy foods, nuts, and legumes. The recommended daily  intake for adult men and adult women is 700 mg. ?Magnesium. This mineral is found in seeds, nuts, dark green vegetables, and legumes. The recommended daily intake for adult men is 400-420 mg. For adult women,  it is 310-320 mg. ?Vitamin K. This vitamin is found in green leafy vegetables. The recommended daily intake is 120 mcg for adult men and 90 mcg for adult women. ?What type of physical activity is best for building and maintaining healthy bones? ?Weight-bearing and strength-building activities are important for building and maintaining healthy bones. Weight-bearing activities cause muscles and bones to work against gravity. Strength-building activities increase the strength of the muscles that support bones. Weight-bearing and muscle-building activities include: ?Walking and hiking. ?Jogging and running. ?Dancing. ?Gym exercises. ?Lifting weights. ?Tennis and racquetball. ?Climbing stairs. ?Aerobics. ?Adults should get at least 30 minutes of moderate physical activity on most days. Children should get at least 60 minutes of moderate physical activity on most days. Ask your health care provider what type of exercise is best for you. ?How can I find out if my bone mass is low? ?Bone mass can be measured with an X-ray test called a bone mineral density (BMD) test. This test is recommended for all women who are age 21 or older. It may also be recommended for: ?Men who are age 53 or older. ?People who are at risk for osteoporosis because of: ?Having a long-term disease that weakens bones, such as kidney disease or rheumatoid arthritis. ?Having menopause earlier than normal. ?Taking medicine that weakens bones, such as steroids, thyroid hormones, or hormone treatment for breast cancer or prostate cancer. ?Smoking. ?Drinking three or more alcoholic drinks a day. ?Being underweight. ?Sedentary lifestyle. ?If you find that you have a low bone mass, you may be able to prevent osteoporosis or further bone loss by changing your diet and lifestyle. ?Where can I find more information? ?Bone Health & Osteoporosis Foundation: AviationTales.fr ?[employer] of Health: www.bones.SouthExposed.es ?International Osteoporosis  Foundation: Administrator.iofbonehealth.org ?Summary ?The aging process leads to an overall loss of bone mass in the body, which can increase the likelihood of broken bones and osteoporosis. ?Eating a well-balanced diet with plenty of calcium and vitamin D will help to protect your bones. ?Weight-bearing and strength-building activities are also important for building and maintaining strong bones. ?Bone mass can be measured with an X-ray test called a bone mineral density (BMD) test. ?This information is not intended to replace advice given to you by your health care provider. Make sure you discuss any questions you have with your health care provider. ?Document Revised: 05/06/2021 Document Reviewed: 05/06/2021 ?Elsevier Patient Education ? [place]. ? ?

## 2022-03-16 ENCOUNTER — Ambulatory Visit
Admission: RE | Admit: 2022-03-16 | Discharge: 2022-03-16 | Disposition: A | Discharge status: Home / Self Care | Payer: No Typology Code available for payment source | Source: Ambulatory Visit | Attending: Nurse Practitioner | Admitting: Nurse Practitioner

## 2022-03-16 DIAGNOSIS — Z1231 Encounter for screening mammogram for malignant neoplasm of breast: Secondary | ICD-10-CM

## 2022-03-22 ENCOUNTER — Ambulatory Visit: Payer: No Typology Code available for payment source | Attending: Audiology | Admitting: Audiology

## 2022-03-22 DIAGNOSIS — H903 Sensorineural hearing loss, bilateral: Secondary | ICD-10-CM | POA: Diagnosis present

## 2022-03-22 NOTE — Procedures (Signed)
?  [facility] ?[address] ?[place], [place]  [address] ?[phone] ? ?AUDIOLOGICAL  EVALUATION ? ?NAME: Tanya Wilkinson     ?DOB:   1961/02/13      ?MRN: [id number]                                                                                     ?DATE: 03/22/2022     ?REFERENT: Chevis Pretty, FNP ?STATUS: Outpatient ?DIAGNOSIS: Sensorineural hearing loss, bilateral   ? ?History: ?Garlene was seen for an audiological evaluation due to due to decreased hearing. Shae reports increased difficulty hearing her friends, hearing her husband and reports turning up the television volume. Terrisha reports intermittent tinnitus and denies otalgia, aural fullness, and dizziness. Jailani reports a history of noise exposure from working in a [employer]. She reports she mostly utilized hearing protection at the [employer].  ? ?Evaluation:  ?Otoscopy showed a clear view of the tympanic membranes, bilaterally ?Tympanometry results were consistent with normal middle ear pressure and normal tympanic membrane mobility, bilaterally.  ?Audiometric testing was completed using Conventional Audiometry techniques with insert earphones and TDH headphones. Test results are consistent with a mild to moderate sensorineural hearing loss in both ears. Sensorineural asymmetry noted at [phone] Hz, worse in the left ear.  Speech Recognition Thresholds were obtained at 25 dB HL in the right ear and at 35  dB HL in the left ear. Word Recognition Testing was completed in the right ear at 60 dB HL and Tashona scored 96% and was completed in the left ear at 70 dB HL and Elliette scored 100%.  ? ?Results:  ?The test results and recommendations were reviewed with Lattie Haw. Today's test results are consistent with normal middle ear function and a mild to moderate sensorineural hearing loss in both ears with a sensorineural asymmetry noted at [phone] Hz, worse in the left ear.  Carlyne will have communication difficulty in many  listening environments. She will benefit form the use of good communication strategies and amplification if motivated. A referral to an ENT was reviewed due to the sensorineural asymmetry.  ? ?Recommendations: ?Referral to ENT due to asymmetric hearing loss ?Communication Needs Assessment with an Audiologist for amplification if motivated.  ?Continue to monitor hearing sensitivity in 2 years ? ?  ?If you have any questions please feel free to contact me at [phone]. ? ?Bari Mantis ?Audiologist, Au.D., CCC-A ?03/22/2022  5:02 PM ? ?Cc: Chevis Pretty, FNP ? ?

## 2022-03-31 ENCOUNTER — Ambulatory Visit (INDEPENDENT_AMBULATORY_CARE_PROVIDER_SITE_OTHER): Payer: No Typology Code available for payment source | Admitting: Radiology

## 2022-03-31 ENCOUNTER — Other Ambulatory Visit (HOSPITAL_COMMUNITY)
Admission: RE | Admit: 2022-03-31 | Discharge: 2022-03-31 | Disposition: A | Discharge status: Home / Self Care | Payer: No Typology Code available for payment source | Source: Ambulatory Visit | Attending: Radiology | Admitting: Radiology

## 2022-03-31 ENCOUNTER — Encounter: Payer: Self-pay | Admitting: Radiology

## 2022-03-31 VITALS — BP 126/80 | Ht 62.0 in | Wt 167.0 lb

## 2022-03-31 DIAGNOSIS — Z01419 Encounter for gynecological examination (general) (routine) without abnormal findings: Secondary | ICD-10-CM | POA: Insufficient documentation

## 2022-03-31 NOTE — Progress Notes (Signed)
? ?  Tanya Wilkinson 1961-07-01 [id number] ? ? ?History: Postmenopausal 61 y.o. presents for annual exam.No gyn concerns. ? ? ?Gynecologic History ?Postmenopausal ?Last Pap: 10/2019. Results were: normal ?Last mammogram: 03/16/22. Results were: normal ?Last colonoscopy: 2015, repeat 10 years ?HRT use: never ?DEXA: 03/09/22 osteopenia ? ?Obstetric History ?OB History  ?Gravida Para Term Preterm AB Living  ?'4 4 4     4  '$ ?SAB IAB Ectopic Multiple Live Births  ?        4  ?  ?# Outcome Date GA Lbr Len/2nd Weight Sex Delivery Anes PTL Lv  ?4 Term     F CS-Classical   LIV  ?3 Term     F CS-Classical   LIV  ?2 Term     M Vag-Spont   LIV  ?1 Term     M Vag-Spont   LIV  ? ? ? ?The following portions of the patient's history were reviewed and updated as appropriate: allergies, current medications, past family history, past medical history, past social history, past surgical history, and problem list. ? ?Review of Systems ?Pertinent items noted in HPI and remainder of comprehensive ROS otherwise negative.  ?Past medical history, past surgical history, family history and social history were all reviewed and documented in the EPIC chart. ? ?Exam: ? ?Vitals:  ? 03/31/22 0801  ?BP: 126/80  ?Weight: 167 lb (75.8 kg)  ?Height: '5\' 2"'$  (1.575 m)  ? ?Body mass index is 30.54 kg/m?. ? ?General appearance:  Normal ?Thyroid:  Symmetrical, normal in size, without palpable masses or nodularity. ?Respiratory ? Auscultation:  Clear without wheezing or rhonchi ?Cardiovascular ? Auscultation:  Regular rate, without rubs, murmurs or gallops ? Edema/varicosities:  Not grossly evident ?Abdominal ? Soft,nontender, without masses, guarding or rebound. ? Liver/spleen:  No organomegaly noted ? Hernia:  None appreciated ? Skin ? Inspection:  Grossly normal ?Breasts: Examined lying and sitting.  ? Right: Without masses, retractions, nipple discharge or axillary adenopathy. ? ? Left: Without masses, retractions, nipple discharge or axillary  adenopathy. ?Genitourinary  ? Inguinal/mons:  Normal without inguinal adenopathy ? External genitalia:  Normal appearing vulva with no masses, tenderness, or lesions ? BUS/Urethra/Skene's glands:  Normal ? Vagina:  Normal appearing with normal color and discharge, no lesions. Atrophy: mild  ? Cervix:  Normal appearing without discharge or lesions ? Uterus:  Normal in size, shape and contour.  Midline and mobile, nontender ? Adnexa/parametria:   ?  Rt: Normal in size, without masses or tenderness. ?  Lt: Normal in size, without masses or tenderness. ? Anus and perineum: Normal ?  ? ?Patient informed chaperone available to be present for breast and pelvic exam. Patient has requested no chaperone to be present. Patient has been advised what will be completed during breast and pelvic exam.  ? ?Assessment/Plan:   ?1. Well woman exam with routine gynecological exam ?Pap with cotesting ?Mammo, dexa, colonoscopy up to date ?- Cytology - PAP( [facility]) ?  ? ?Discussed SBE, colonoscopy and DEXA screening as directed. Recommend 153mns of exercise weekly, including weight bearing exercise. Encouraged the use of seatbelts and sunscreen.  ?Return in 1 year for annual or sooner prn. ? ?CKerry DoryWHNP-BC, 8:24 AM 03/31/2022  ?

## 2022-04-01 LAB — CYTOLOGY - PAP
Comment: NEGATIVE
Diagnosis: NEGATIVE
High risk HPV: NEGATIVE

## 2022-05-01 ENCOUNTER — Other Ambulatory Visit (HOSPITAL_COMMUNITY): Payer: Self-pay

## 2022-05-04 ENCOUNTER — Other Ambulatory Visit (HOSPITAL_COMMUNITY): Payer: Self-pay

## 2022-06-21 ENCOUNTER — Telehealth: Payer: No Typology Code available for payment source | Admitting: Physician Assistant

## 2022-06-21 DIAGNOSIS — L237 Allergic contact dermatitis due to plants, except food: Secondary | ICD-10-CM | POA: Diagnosis not present

## 2022-06-22 ENCOUNTER — Other Ambulatory Visit (HOSPITAL_COMMUNITY): Payer: Self-pay

## 2022-06-22 MED ORDER — TRIAMCINOLONE ACETONIDE 0.1 % EX CREA
1.0000 | TOPICAL_CREAM | Freq: Two times a day (BID) | CUTANEOUS | 0 refills | Status: DC
  Filled 2022-06-22: qty 30, 15d supply, fill #0

## 2022-06-22 NOTE — Progress Notes (Signed)
E-Visit for [employer]  We are sorry that you are not feeing well.  Here is how we plan to help!  Based on what you have shared with me it looks like you have had an allergic reaction to the oily resin from a group of plants.  This resin is very sticky, so it easily attaches to your skin, clothing, tools equipment, and pet's fur.    This blistering rash is often called poison ivy rash although it can come from contact with the leaves, stems and roots of poison ivy, poison oak and poison sumac.  The oily resin contains urushiol (u-ROO-she-ol) that produces a skin rash on exposed skin.  The severity of the rash depends on the amount of urushiol that gets on your skin.  A section of skin with more urushiol on it may develop a rash sooner.  The rash usually develops 12-48 hours after exposure and can last two to three weeks.  Your skin must come in direct contact with the plant's oil to be affected.  Blister fluid doesn't spread the rash.  However, if you come into contact with a piece of clothing or pet fur that has urushiol on it, the rash may spread out.  You can also transfer the oil to other parts of your body with your fingers.  Often the rash looks like a straight line because of the way the plant brushes against your skin.  Most poison ivy treatments are usually limited to self-care methods. Small areas of the rash will typically go away on its own in two to three weeks.  Since your rash is limited, I have prescribed a topical steroid cream (much more potent than the OTC cortisone, etc) to apply as directed and I am recommending that you follow these recommendations:  Make sure that the clothes you were wearing and any towels or sheets that may have come in contact with the oil (urushiol) are washed in detergent and hot water.  Apply Benadryl or Caladryl lotion to the rash.  You may apply these as often as needed to control the itching.  Cool baths also often help with itching.  You may also apply an  over-the-counter corticosteroid cream for the first few days.  Take oral antihistamines, such as diphenhydramine (Benadryl, others), which may also help you sleep better.  Soak in a cool-water bath containing an oatmeal-based bath product (Aveeno).  Place Cool, wet compresses on the affected area for 15 to 30 minutes several times a day.  Avoid a hot shower or bath as this may increase your itching.       What can you do to prevent this rash?  Avoid the plants.  Learn how to identify poison ivy, poison oak and poison sumac in all seasons.  When hiking or engaging in other activities that might expose you to these plants, try to stay on cleared pathways.  If camping, make sure you pitch your tent in an area free of these plants.  Keep pets from running through wooded areas so that urushiol doesn't accidentally stick to their fur, which you may touch.  Remove or kill the plants.  In your yard, you can get rid of poison ivy by applying an herbicide or pulling it out of the ground, including the roots, while wearing heavy gloves.  Afterward remove the gloves and thoroughly wash them and your hands.  Don't burn poison ivy or related plants because the urushiol can be carried by smoke.  Wear protective clothing.  If needed, protect your skin by wearing socks, boots, pants, long sleeves and vinyl gloves.  Wash your skin right away.  Washing off the oil with soap and water within 30 minutes of exposure may reduce your chances of getting a poison ivy rash.  Even washing after an hour or so can help reduce the severity of the rash.  If you walk through some poison ivy and then later touch your shoes, you may get some urushiol on your hands, which may then transfer to your face or body by touching or rubbing.  If the contaminated object isn't cleaned, the urushiol on it can still cause a skin reaction years later.    Be careful not to reuse towels after you have washed your skin.  Also carefully wash clothing  in detergent and hot water to remove all traces of the oil.  Handle contaminated clothing carefully so you don't transfer the urushiol to yourself, furniture, rugs or appliances.  Remember that pets can carry the oil on their fur and paws.  If you think your pet may be contaminated with urushiol, put on some long rubber gloves and give your pet a bath.  Finally, be careful not to burn these plants as the smoke can contain traces of the oil.  Inhaling the smoke may result in difficulty breathing. If that occurred you should see a physician as soon as possible.  See your doctor right away if:  The reaction is severe or widespread You inhaled the smoke from burning poison ivy and are having difficulty breathing Your skin continues to swell The rash affects your eyes, mouth or genitals Blisters are oozing pus You develop a fever greater than 100 F (37.8 C) The rash doesn't get better within a few weeks.  If you scratch the poison ivy rash, bacteria under your fingernails may cause the skin to become infected.  See your doctor if pus starts oozing from the blisters.  Treatment generally includes antibiotics.  Poison ivy treatments are usually limited to self-care methods.  And the rash typically goes away on its own in two to three weeks.     If the rash is widespread or results in a large number of blisters, your doctor may prescribe an oral corticosteroid, such as prednisone.  If a bacterial infection has developed at the rash site, your doctor may give you a prescription for an oral antibiotic.  MAKE SURE YOU  Understand these instructions. Will watch your condition. Will get help right away if you are not doing well or get worse.   Thank you for choosing an e-visit.  Your e-visit answers were reviewed by a board certified advanced clinical practitioner to complete your personal care plan. Depending upon the condition, your plan could have included both over the counter or prescription  medications.  Please review your pharmacy choice. Make sure the pharmacy is open so you can pick up prescription now. If there is a problem, you may contact your provider through [employer] and have the prescription routed to another pharmacy.  Your safety is important to [place]. If you have drug allergies check your prescription carefully.   For the next 24 hours you can use MyChart to ask questions about today's visit, request a non-urgent call back, or ask for a work or school excuse. You will get an email in the next two days asking about your experience. I hope that your e-visit has been valuable and will speed your recovery.

## 2022-06-22 NOTE — Progress Notes (Signed)
I have spent 5 minutes in review of e-visit questionnaire, review and updating patient chart, medical decision making and response to patient.   Nayan Proch Cody Azyiah Bo, PA-C    

## 2022-09-10 ENCOUNTER — Other Ambulatory Visit (HOSPITAL_COMMUNITY): Payer: Self-pay

## 2022-09-21 ENCOUNTER — Ambulatory Visit: Payer: No Typology Code available for payment source | Admitting: Nurse Practitioner

## 2022-10-07 ENCOUNTER — Telehealth: Payer: No Typology Code available for payment source | Admitting: Physician Assistant

## 2022-10-07 DIAGNOSIS — L259 Unspecified contact dermatitis, unspecified cause: Secondary | ICD-10-CM

## 2022-10-07 MED ORDER — PREDNISONE 10 MG PO TABS: ORAL_TABLET | ORAL | 0 refills | Status: DC

## 2022-10-07 NOTE — Progress Notes (Signed)
Message sent to patient requesting further input regarding current symptoms. Awaiting patient response.  

## 2022-10-07 NOTE — Progress Notes (Signed)
E Visit for Rash  We are sorry that you are not feeling well. Here is how we plan to help!  Based on what you shared with me it looks like you have contact dermatitis.  Contact dermatitis is a skin rash caused by something that touches the skin and causes irritation or inflammation.  Your skin may be red, swollen, dry, cracked, and itch.  The rash should go away in a few days but can last a few weeks.  If you get a rash, it's important to figure out what caused it so the irritant can be avoided in the future. and I am prescribing a two week course of steroids (37 tablets of 10 mg prednisone).  Days 1-4 take 4 tablets (40 mg) daily  Days 5-8 take 3 tablets (30 mg) daily, Days 9-11 take 2 tablets (20 mg) daily, Days 12-14 take 1 tablet (10 mg) daily.       HOME CARE:  Take cool showers and avoid direct sunlight. Apply cool compress or wet dressings. Take a bath in an oatmeal bath.  Sprinkle content of one Aveeno packet under running faucet with comfortably warm water.  Bathe for 15-20 minutes, 1-2 times daily.  Pat dry with a towel. Do not rub the rash. Use hydrocortisone cream. Take an antihistamine like Benadryl for widespread rashes that itch.  The adult dose of Benadryl is 25-50 mg by mouth 4 times daily. Caution:  This type of medication may cause sleepiness.  Do not drink alcohol, drive, or operate dangerous machinery while taking antihistamines.  Do not take these medications if you have prostate enlargement.  Read package instructions thoroughly on all medications that you take.  GET HELP RIGHT AWAY IF:  Symptoms don't go away after treatment. Severe itching that persists. If you rash spreads or swells. If you rash begins to smell. If it blisters and opens or develops a yellow-brown crust. You develop a fever. You have a sore throat. You become short of breath.  MAKE SURE YOU:  Understand these instructions. Will watch your condition. Will get help right away if you are not  doing well or get worse.  Thank you for choosing an e-visit.  Your e-visit answers were reviewed by a board certified advanced clinical practitioner to complete your personal care plan. Depending upon the condition, your plan could have included both over the counter or prescription medications.  Please review your pharmacy choice. Make sure the pharmacy is open so you can pick up prescription now. If there is a problem, you may contact your provider through [employer] and have the prescription routed to another pharmacy.  Your safety is important to [place]. If you have drug allergies check your prescription carefully.   For the next 24 hours you can use MyChart to ask questions about today's visit, request a non-urgent call back, or ask for a work or school excuse. You will get an email in the next two days asking about your experience. I hope that your e-visit has been valuable and will speed your recovery.

## 2022-10-07 NOTE — Progress Notes (Signed)
I have spent 5 minutes in review of e-visit questionnaire, review and updating patient chart, medical decision making and response to patient.   Ether Goebel Cody Kylar Speelman, PA-C    

## 2022-10-10 ENCOUNTER — Telehealth: Payer: No Typology Code available for payment source | Admitting: Family Medicine

## 2022-10-10 DIAGNOSIS — N3000 Acute cystitis without hematuria: Secondary | ICD-10-CM

## 2022-10-10 MED ORDER — CEPHALEXIN 500 MG PO CAPS: 500.0000 mg | ORAL_CAPSULE | Freq: Two times a day (BID) | ORAL | 0 refills | Status: DC

## 2022-10-10 NOTE — Progress Notes (Signed)

## 2022-10-14 ENCOUNTER — Ambulatory Visit: Payer: No Typology Code available for payment source | Admitting: Nurse Practitioner

## 2022-10-15 ENCOUNTER — Encounter: Payer: Self-pay | Admitting: Nurse Practitioner

## 2022-10-15 ENCOUNTER — Ambulatory Visit (INDEPENDENT_AMBULATORY_CARE_PROVIDER_SITE_OTHER): Payer: No Typology Code available for payment source | Admitting: Nurse Practitioner

## 2022-10-15 ENCOUNTER — Other Ambulatory Visit (HOSPITAL_COMMUNITY): Payer: Self-pay

## 2022-10-15 VITALS — BP 146/80 | HR 63 | Temp 97.9°F | Resp 20 | Ht 62.0 in | Wt 165.0 lb

## 2022-10-15 DIAGNOSIS — E785 Hyperlipidemia, unspecified: Secondary | ICD-10-CM

## 2022-10-15 DIAGNOSIS — M8580 Other specified disorders of bone density and structure, unspecified site: Secondary | ICD-10-CM | POA: Diagnosis not present

## 2022-10-15 DIAGNOSIS — R7989 Other specified abnormal findings of blood chemistry: Secondary | ICD-10-CM

## 2022-10-15 DIAGNOSIS — E8881 Metabolic syndrome: Secondary | ICD-10-CM | POA: Diagnosis not present

## 2022-10-15 DIAGNOSIS — I10 Essential (primary) hypertension: Secondary | ICD-10-CM | POA: Diagnosis not present

## 2022-10-15 DIAGNOSIS — Z6828 Body mass index (BMI) 28.0-28.9, adult: Secondary | ICD-10-CM

## 2022-10-15 MED ORDER — FENOFIBRATE 145 MG PO TABS
145.0000 mg | ORAL_TABLET | Freq: Every day | ORAL | 1 refills | Status: DC
  Filled 2022-10-15 – 2023-02-08 (×2): qty 90, 90d supply, fill #0
  Filled 2023-05-09: qty 90, 90d supply, fill #1
  Filled 2023-05-10: qty 90, 90d supply, fill #0

## 2022-10-15 MED ORDER — HYDROCHLOROTHIAZIDE 25 MG PO TABS
25.0000 mg | ORAL_TABLET | Freq: Every day | ORAL | 1 refills | Status: DC
Start: 2022-10-15 — End: 2023-05-27
  Filled 2022-10-15 – 2023-02-08 (×2): qty 90, 90d supply, fill #0
  Filled 2023-05-09: qty 90, 90d supply, fill #1
  Filled 2023-05-10: qty 90, 90d supply, fill #0

## 2022-10-15 MED ORDER — ATORVASTATIN CALCIUM 40 MG PO TABS
40.0000 mg | ORAL_TABLET | Freq: Every day | ORAL | 1 refills | Status: DC
  Filled 2022-10-15 – 2023-01-17 (×3): qty 90, 90d supply, fill #0
  Filled 2023-05-09: qty 90, 90d supply, fill #1
  Filled 2023-05-10: qty 90, 90d supply, fill #0

## 2022-10-15 NOTE — Progress Notes (Signed)
Subjective:    Patient ID: Tanya Wilkinson, female    DOB: [birth year], 61 y.o.   MRN: [id number]   Chief Complaint: medical management of chronic issues     HPI:  Tanya Wilkinson is a 61 y.o. who identifies as a female who was assigned female at birth.   Social history: Lives with: wife Work history: [employer] in today for follow up of the following chronic medical issues:  1. Essential hypertension No c/o chest pain, sob or headache. Does not check blood pressure at home. BP Readings from Last 3 Encounters:  03/31/22 126/80  03/09/22 137/79  09/08/21 135/80     2. Hyperlipidemia with target LDL less than 100 Does try to watch diet but does no dedicated exercise Lab Results  Component Value Date   CHOL 184 03/09/2022   HDL 49 03/09/2022   LDLCALC 99 03/09/2022   TRIG 213 (H) 03/09/2022   CHOLHDL 3.8 03/09/2022     3. Metabolic syndrome Does not check blood sugars at home. Lab Results  Component Value Date   HGBA1C 5.6 09/08/2021     4. Osteopenia, unspecified location Doe snot eight bearing exercise. Last deascan was done on 03/09/22. Her t score was -2.0  5. Low serum vitamin D Is on daily vitamin d supplement  6. BMI 28.0-28.9,adult Weight is down 2lbs  Wt Readings from Last 3 Encounters:  10/15/22 165 lb (74.8 kg)  03/31/22 167 lb (75.8 kg)  03/09/22 165 lb (74.8 kg)   BMI Readings from Last 3 Encounters:  10/15/22 30.18 kg/m  03/31/22 30.54 kg/m  03/09/22 30.18 kg/m     New complaints: None today  Allergies  Allergen Reactions   Morphine And Related Itching   Outpatient Encounter Medications as of 10/15/2022  Medication Sig   atorvastatin (LIPITOR) 40 MG tablet TAKE 1 TABLET BY MOUTH ONCE A DAY   CALCIUM PO Take by mouth.   cephALEXin (KEFLEX) 500 MG capsule Take 1 capsule (500 mg total) by mouth 2 (two) times daily for 7 days.   Cholecalciferol (VITAMIN D) 2000 units CAPS Take by mouth.   cyclobenzaprine (FLEXERIL) 10 MG  tablet Take 1 tablet (10 mg total) by mouth 3 (three) times daily as needed for muscle spasms.   ELDERBERRY PO Take by mouth. + zinc & vitamin c   fenofibrate (TRICOR) 145 MG tablet Take 1 tablet (145 mg total) by mouth daily. (Patient not taking: Reported on 03/31/2022)   fluticasone (FLONASE) 50 MCG/ACT nasal spray Place 2 sprays into both nostrils daily.   hydrochlorothiazide (HYDRODIURIL) 25 MG tablet TAKE 1 TABLET BY MOUTH ONCE A DAY   Multiple Vitamins-Minerals (CENTRUM SILVER PO) Take 1 tablet by mouth daily.   naproxen (NAPROSYN) 500 MG tablet Take 1 tablet (500 mg total) by mouth 2 (two) times daily with a meal.   predniSONE (DELTASONE) 10 MG tablet Take 4 tablets (40 mg total) by mouth daily with breakfast for 4 days, THEN 3 tablets (30 mg total) daily with breakfast for 4 days, THEN 2 tablets (20 mg total) daily with breakfast for 3 days, THEN 1 tablet (10 mg total) daily with breakfast for 3 days.   triamcinolone cream (KENALOG) 0.1 % Apply to affected areas topically 2 (two) times daily.   No facility-administered encounter medications on file as of 10/15/2022.    Past Surgical History:  Procedure Laterality Date   ANTERIOR CERVICAL DECOMP/DISCECTOMY FUSION  08/2006   CESAREAN SECTION  [id number]  COLONOSCOPY  2002   Hospital in [place]  [id number]   BTL    Family History  Problem Relation Age of Onset   Diabetes Mother    Hypertension Mother    Kidney disease Mother    Cancer Father        throat & lung-smoker   Stroke Father    Stroke Brother    Hypertension Brother    Cancer Brother 41       bladder   Stroke Brother    Early death Brother 69   Stroke Son    Hypertension Son    Colon cancer Neg Hx    Pancreatic cancer Neg Hx    Stomach cancer Neg Hx    Esophageal cancer Neg Hx    Breast cancer Neg Hx       Controlled substance contract: n/a     Review of Systems  Constitutional:  Negative for diaphoresis.  Eyes:  Negative for pain.   Respiratory:  Negative for shortness of breath.   Cardiovascular:  Negative for chest pain, palpitations and leg swelling.  Gastrointestinal:  Negative for abdominal pain.  Endocrine: Negative for polydipsia.  Skin:  Negative for rash.  Neurological:  Negative for dizziness, weakness and headaches.  Hematological:  Does not bruise/bleed easily.  All other systems reviewed and are negative.      Objective:   Physical Exam Vitals and nursing note reviewed.  Constitutional:      General: She is not in acute distress.    Appearance: Normal appearance. She is well-developed.  HENT:     Head: Normocephalic.     Right Ear: Tympanic membrane normal.     Left Ear: Tympanic membrane normal.     Nose: Nose normal.     Mouth/Throat:     Mouth: Mucous membranes are moist.  Eyes:     Pupils: Pupils are equal, round, and reactive to light.  Neck:     Vascular: No carotid bruit or JVD.  Cardiovascular:     Rate and Rhythm: Normal rate and regular rhythm.     Heart sounds: Normal heart sounds.  Pulmonary:     Effort: Pulmonary effort is normal. No respiratory distress.     Breath sounds: Normal breath sounds. No wheezing or rales.  Chest:     Chest wall: No tenderness.  Abdominal:     General: Bowel sounds are normal. There is no distension or abdominal bruit.     Palpations: Abdomen is soft. There is no hepatomegaly, splenomegaly, mass or pulsatile mass.     Tenderness: There is no abdominal tenderness.  Musculoskeletal:        General: Normal range of motion.     Cervical back: Normal range of motion and neck supple.  Lymphadenopathy:     Cervical: No cervical adenopathy.  Skin:    General: Skin is warm and dry.  Neurological:     Mental Status: She is alert and oriented to person, place, and time.     Deep Tendon Reflexes: Reflexes are normal and symmetric.  Psychiatric:        Behavior: Behavior normal.        Thought Content: Thought content normal.        Judgment:  Judgment normal.    BP (!) 146/80   Pulse 63   Temp 97.9 F (36.6 C) (Temporal)   Resp 20   Ht _0  (1.575 m)   Wt 165 lb (74.8 kg)  LMP 12/07/2007   SpO2 95%   BMI 30.18 kg/m         Assessment & Plan:  Tanya Wilkinson comes in today with chief complaint of Medical Management of Chronic Issues   Diagnosis and orders addressed:  1. Essential hypertension Low sodium diet - hydrochlorothiazide (HYDRODIURIL) 25 MG tablet; TAKE 1 TABLET BY MOUTH ONCE A DAY  Dispense: 90 tablet; Refill: 1 - CBC with Differential/Platelet - CMP14+EGFR  2. Hyperlipidemia with target LDL less than 100 Low fta diet - atorvastatin (LIPITOR) 40 MG tablet; TAKE 1 TABLET BY MOUTH ONCE A DAY  Dispense: 90 tablet; Refill: 1 - fenofibrate (TRICOR) 145 MG tablet; Take 1 tablet (145 mg total) by mouth daily.  Dispense: 90 tablet; Refill: 1 - Lipid panel  3. Metabolic syndrome Watch carbs in diet  4. Osteopenia, unspecified location Weight bearing exercises  5. Low serum vitamin D Continue vitamin d supplement  6. BMI 28.0-28.9,adult Discussed diet and exercise for person with BMI >25 Will recheck weight in 3-6 months    Labs pending Health Maintenance reviewed Diet and exercise encouraged  Follow up plan: 6 months   Mary-Margaret Hassell Done, FNP

## 2022-10-16 LAB — CMP14+EGFR
ALT: 16 IU/L (ref 0–32)
AST: 14 IU/L (ref 0–40)
Albumin/Globulin Ratio: 2.2 (ref 1.2–2.2)
Albumin: 4.4 g/dL (ref 3.9–4.9)
Alkaline Phosphatase: 64 IU/L (ref 44–121)
BUN/Creatinine Ratio: 17 (ref 12–28)
BUN: 14 mg/dL (ref 8–27)
Bilirubin Total: 0.3 mg/dL (ref 0.0–1.2)
CO2: 27 mmol/L (ref 20–29)
Calcium: 9.7 mg/dL (ref 8.7–10.3)
Chloride: 101 mmol/L (ref 96–106)
Creatinine, Ser: 0.84 mg/dL (ref 0.57–1.00)
Globulin, Total: 2 g/dL (ref 1.5–4.5)
Glucose: 85 mg/dL (ref 70–99)
Potassium: 3.8 mmol/L (ref 3.5–5.2)
Sodium: 142 mmol/L (ref 134–144)
Total Protein: 6.4 g/dL (ref 6.0–8.5)
eGFR: 78 mL/min/{1.73_m2} (ref >59)

## 2022-10-16 LAB — CBC WITH DIFFERENTIAL/PLATELET
Basophils Absolute: 0.1 10*3/uL (ref 0.0–0.2)
Basos: 1 %
EOS (ABSOLUTE): 0.1 10*3/uL (ref 0.0–0.4)
Eos: 1 %
Hematocrit: 35.6 % (ref 34.0–46.6)
Hemoglobin: 11.7 g/dL (ref 11.1–15.9)
Immature Grans (Abs): 0.1 10*3/uL (ref 0.0–0.1)
Immature Granulocytes: 1 %
Lymphocytes Absolute: 4 10*3/uL — ABNORMAL HIGH (ref 0.7–3.1)
Lymphs: 40 %
MCH: 30.5 pg (ref 26.6–33.0)
MCHC: 32.9 g/dL (ref 31.5–35.7)
MCV: 93 fL (ref 79–97)
Monocytes Absolute: 0.6 10*3/uL (ref 0.1–0.9)
Monocytes: 6 %
Neutrophils Absolute: 5.3 10*3/uL (ref 1.4–7.0)
Neutrophils: 51 %
Platelets: 380 10*3/uL (ref 150–450)
RBC: 3.83 x10E6/uL (ref 3.77–5.28)
RDW: 11.9 % (ref 11.7–15.4)
WBC: 10.1 10*3/uL (ref 3.4–10.8)

## 2022-10-16 LAB — LIPID PANEL
Chol/HDL Ratio: 2.6 ratio (ref 0.0–4.4)
Cholesterol, Total: 154 mg/dL (ref 100–199)
HDL: 60 mg/dL (ref >39)
LDL Chol Calc (NIH): 72 mg/dL (ref 0–99)
Triglycerides: 124 mg/dL (ref 0–149)
VLDL Cholesterol Cal: 22 mg/dL (ref 5–40)

## 2022-12-07 ENCOUNTER — Other Ambulatory Visit (HOSPITAL_COMMUNITY): Payer: Self-pay

## 2023-01-17 ENCOUNTER — Other Ambulatory Visit: Payer: Self-pay

## 2023-01-17 ENCOUNTER — Other Ambulatory Visit (HOSPITAL_COMMUNITY): Payer: Self-pay

## 2023-01-22 ENCOUNTER — Telehealth: Payer: 59 | Admitting: Family Medicine

## 2023-01-22 DIAGNOSIS — J40 Bronchitis, not specified as acute or chronic: Secondary | ICD-10-CM

## 2023-01-22 MED ORDER — PREDNISONE 20 MG PO TABS: 20.0000 mg | ORAL_TABLET | Freq: Two times a day (BID) | ORAL | 0 refills | Status: AC

## 2023-01-22 MED ORDER — BENZONATATE 200 MG PO CAPS: 200.0000 mg | ORAL_CAPSULE | Freq: Two times a day (BID) | ORAL | 0 refills | Status: DC | PRN

## 2023-01-22 NOTE — Progress Notes (Signed)
We are sorry that you are not feeling well.  Here is how we plan to help!  Based on your presentation I believe you most likely have A cough due to a virus.  This is called viral bronchitis and is best treated by rest, plenty of fluids and control of the cough.  You may use Ibuprofen or Tylenol as directed to help your symptoms.     In addition you may use A prescription cough medication called Tessalon Perles 16m. You may take 1-2 capsules every 8 hours as needed for your cough.  Prednisone 20 mg po BID for 5 days  From your responses in the eVisit questionnaire you describe inflammation in the upper respiratory tract which is causing a significant cough.  This is commonly called Bronchitis and has four common causes:   Allergies Viral Infections Acid Reflux Bacterial Infection Allergies, viruses and acid reflux are treated by controlling symptoms or eliminating the cause. An example might be a cough caused by taking certain blood pressure medications. You stop the cough by changing the medication. Another example might be a cough caused by acid reflux. Controlling the reflux helps control the cough.  USE OF BRONCHODILATOR ("RESCUE") INHALERS: There is a risk from using your bronchodilator too frequently.  The risk is that over-reliance on a medication which only relaxes the muscles surrounding the breathing tubes can reduce the effectiveness of medications prescribed to reduce swelling and congestion of the tubes themselves.  Although you feel brief relief from the bronchodilator inhaler, your asthma may actually be worsening with the tubes becoming more swollen and filled with mucus.  This can delay other crucial treatments, such as oral steroid medications. If you need to use a bronchodilator inhaler daily, several times per day, you should discuss this with your provider.  There are probably better treatments that could be used to keep your asthma under control.     HOME CARE Only take  medications as instructed by your medical team. Complete the entire course of an antibiotic. Drink plenty of fluids and get plenty of rest. Avoid close contacts especially the very young and the elderly Cover your mouth if you cough or cough into your sleeve. Always remember to wash your hands A steam or ultrasonic humidifier can help congestion.   GET HELP RIGHT AWAY IF: You develop worsening fever. You become short of breath You cough up blood. Your symptoms persist after you have completed your treatment plan MAKE SURE YOU  Understand these instructions. Will watch your condition. Will get help right away if you are not doing well or get worse.    Thank you for choosing an e-visit.  Your e-visit answers were reviewed by a board certified advanced clinical practitioner to complete your personal care plan. Depending upon the condition, your plan could have included both over the counter or prescription medications.  Please review your pharmacy choice. Make sure the pharmacy is open so you can pick up prescription now. If there is a problem, you may contact your provider through [employer] have the prescription routed to another pharmacy.  Your safety is important to [place] If you have drug allergies check your prescription carefully.   For the next 24 hours you can use MyChart to ask questions about today's visit, request a non-urgent call back, or ask for a work or school excuse. You will get an email in the next two days asking about your experience. I hope that your e-visit has been valuable and will  speed your recovery.    have provided 5 minutes of non face to face time during this encounter for chart review and documentation.

## 2023-01-25 ENCOUNTER — Telehealth (INDEPENDENT_AMBULATORY_CARE_PROVIDER_SITE_OTHER): Payer: 59 | Admitting: Nurse Practitioner

## 2023-01-25 ENCOUNTER — Encounter: Payer: Self-pay | Admitting: Nurse Practitioner

## 2023-01-25 ENCOUNTER — Other Ambulatory Visit (HOSPITAL_COMMUNITY): Payer: Self-pay

## 2023-01-25 DIAGNOSIS — U071 COVID-19: Secondary | ICD-10-CM | POA: Diagnosis not present

## 2023-01-25 MED ORDER — NIRMATRELVIR/RITONAVIR (PAXLOVID)TABLET
3.0000 | ORAL_TABLET | Freq: Two times a day (BID) | ORAL | 0 refills | Status: DC
  Filled 2023-01-25: qty 30, 5d supply, fill #0

## 2023-01-25 MED ORDER — NIRMATRELVIR/RITONAVIR (PAXLOVID)TABLET: 3.0000 | ORAL_TABLET | Freq: Two times a day (BID) | ORAL | 0 refills | Status: AC

## 2023-01-25 NOTE — Progress Notes (Signed)
Virtual Visit Consent   Tanya Wilkinson, you are scheduled for a virtual visit with Mary-Margaret Hassell Done, [place], a [facility] provider, today.     Just as with appointments in the office, your consent must be obtained to participate.  Your consent will be active for this visit and any virtual visit you may have with one of our providers in the next 365 days.     If you have a MyChart account, a copy of this consent can be sent to you electronically.  All virtual visits are billed to your insurance company just like a traditional visit in the office.    As this is a virtual visit, video technology does not allow for your provider to perform a traditional examination.  This may limit your provider's ability to fully assess your condition.  If your provider identifies any concerns that need to be evaluated in person or the need to arrange testing (such as labs, EKG, etc.), we will make arrangements to do so.     Although advances in technology are sophisticated, we cannot ensure that it will always work on either your end or our end.  If the connection with a video visit is poor, the visit may have to be switched to a telephone visit.  With either a video or telephone visit, we are not always able to ensure that we have a secure connection.     I need to obtain your verbal consent now.   Are you willing to proceed with your visit today? YES   Tanya Wilkinson has provided verbal consent on 01/25/2023 for a virtual visit (video or telephone).   Mary-Margaret Hassell Done, FNP   Date: 01/25/2023 11:16 AM   Virtual Visit via Video Note   I, Mary-Margaret Hassell Done, connected with Tanya Wilkinson ([id number], [birth year]) on 01/25/23 at 11:00 AM EST by a video-enabled telemedicine application and verified that I am speaking with the correct person using two identifiers.  Location: Patient: Virtual Visit Location Patient: Home Provider: Virtual Visit Location Provider: Mobile   I discussed the limitations of  evaluation and management by telemedicine and the availability of in person appointments. The patient expressed understanding and agreed to proceed.    History of Present Illness: Tanya Wilkinson is a 62 y.o. who identifies as a female who was assigned female at birth, and is being seen today for covid positive.  HPI: URI  This is a new problem. The current episode started in the past 7 days. The problem has been gradually worsening. There has been no fever. Associated symptoms include congestion, coughing, headaches, rhinorrhea and a sore throat. Treatments tried: prednisone and tessalon perles. The treatment provided moderate relief.  Covid test this morning was positive Review of Systems  HENT:  Positive for congestion, rhinorrhea and sore throat.   Respiratory:  Positive for cough.   Neurological:  Positive for headaches.    Problems:  Patient Active Problem List   Diagnosis Date Noted   Essential hypertension 02/26/2020   BMI 28.0-28.9,adult 06/29/2016   Osteopenia 09/04/2014   Low serum vitamin D 09/04/2014   Hyperlipidemia with target LDL less than 100 123XX123   Metabolic syndrome 123XX123    Allergies:  Allergies  Allergen Reactions   Morphine And Related Itching   Medications:  Current Outpatient Medications:    atorvastatin (LIPITOR) 40 MG tablet, Take 1 tablet (40 mg total) by mouth daily., Disp: 90 tablet, Rfl: 1   benzonatate (TESSALON) 200 MG capsule, Take 1  capsule (200 mg total) by mouth 2 (two) times daily as needed for cough., Disp: 20 capsule, Rfl: 0   CALCIUM PO, Take by mouth., Disp: , Rfl:    Cholecalciferol (VITAMIN D) 2000 units CAPS, Take by mouth., Disp: , Rfl:    ELDERBERRY PO, Take by mouth. + zinc & vitamin c, Disp: , Rfl:    fenofibrate (TRICOR) 145 MG tablet, Take 1 tablet (145 mg total) by mouth daily., Disp: 90 tablet, Rfl: 1   hydrochlorothiazide (HYDRODIURIL) 25 MG tablet, Take 1 tablet (25 mg total) by mouth daily., Disp: 90 tablet, Rfl: 1    Multiple Vitamins-Minerals (CENTRUM SILVER PO), Take 1 tablet by mouth daily., Disp: , Rfl:    predniSONE (DELTASONE) 20 MG tablet, Take 1 tablet (20 mg total) by mouth 2 (two) times daily with a meal for 5 days., Disp: 10 tablet, Rfl: 0   triamcinolone cream (KENALOG) 0.1 %, Apply to affected areas topically 2 (two) times daily., Disp: 30 g, Rfl: 0  Observations/Objective: Patient is well-developed, well-nourished in no acute distress.  Resting comfortably  at home.  Head is normocephalic, atraumatic.  No labored breathing.  Speech is clear and coherent with logical content.  Patient is alert and oriented at baseline.  Raspy voice Dry cough  Assessment and Plan:  Tanya Wilkinson in today with chief complaint of Covid Positive   1. Positive self-administered antigen test for COVID-19 1. Take meds as prescribed 2. Use a cool mist humidifier especially during the winter months and when heat has been humid. 3. Use saline nose sprays frequently 4. Saline irrigations of the nose can be very helpful if done frequently.  * 4X daily for 1 week*  * Use of a nettie pot can be helpful with this. Follow directions with this* 5. Drink plenty of fluids 6. Keep thermostat turn down low 7.For any cough or congestion- tessalon perles 8. For fever or aces or pains- take tylenol or ibuprofen appropriate for age and weight.  * for fevers greater than 101 orally you may alternate ibuprofen and tylenol every  3 hours.   Meds ordered this encounter  Medications   DISCONTD: nirmatrelvir/ritonavir (PAXLOVID) 20 x 150 MG & 10 x 100MG TABS    Sig: Take 3 tablets by mouth 2 (two) times daily for 5 days. (Take nirmatrelvir 150 mg two tablets twice daily for 5 days and ritonavir 100 mg one tablet twice daily for 5 days) Patient GFR is 78    Dispense:  30 tablet    Refill:  0    Order Specific Question:   Supervising Provider    Answer:   Caryl Pina A [id number]   nirmatrelvir/ritonavir (PAXLOVID) 20 x  150 MG & 10 x 100MG TABS    Sig: Take 3 tablets by mouth 2 (two) times daily for 5 days. (Take nirmatrelvir 150 mg two tablets twice daily for 5 days and ritonavir 100 mg one tablet twice daily for 5 days) Patient GFR is 78    Dispense:  30 tablet    Refill:  0    Order Specific Question:   Supervising Provider    Answer:   Caryl Pina A [id number]         Follow Up Instructions: I discussed the assessment and treatment plan with the patient. The patient was provided an opportunity to ask questions and all were answered. The patient agreed with the plan and demonstrated an understanding of the instructions.  A copy of instructions were sent  to the patient via [place].  The patient was advised to call back or seek an in-person evaluation if the symptoms worsen or if the condition fails to improve as anticipated.  Time:  I spent 8 minutes with the patient via telehealth technology discussing the above problems/concerns.    Mary-Margaret Hassell Done, FNP

## 2023-01-25 NOTE — Patient Instructions (Signed)
Tanya Wilkinson, thank you for joining Chevis Pretty, FNP for today's virtual visit.  While this provider is not your primary care provider (PCP), if your PCP is located in our provider database this encounter information will be shared with them immediately following your visit.   [facility] account gives you access to today's visit and all your visits, tests, and labs performed at [facility] " click here if you don't have a [facility] account or go to [email or web address]  Consent: (Patient) Tanya Wilkinson provided verbal consent for this virtual visit at the beginning of the encounter.  Current Medications:  Current Outpatient Medications:    atorvastatin (LIPITOR) 40 MG tablet, Take 1 tablet (40 mg total) by mouth daily., Disp: 90 tablet, Rfl: 1   benzonatate (TESSALON) 200 MG capsule, Take 1 capsule (200 mg total) by mouth 2 (two) times daily as needed for cough., Disp: 20 capsule, Rfl: 0   CALCIUM PO, Take by mouth., Disp: , Rfl:    Cholecalciferol (VITAMIN D) 2000 units CAPS, Take by mouth., Disp: , Rfl:    ELDERBERRY PO, Take by mouth. + zinc & vitamin c, Disp: , Rfl:    fenofibrate (TRICOR) 145 MG tablet, Take 1 tablet (145 mg total) by mouth daily., Disp: 90 tablet, Rfl: 1   hydrochlorothiazide (HYDRODIURIL) 25 MG tablet, Take 1 tablet (25 mg total) by mouth daily., Disp: 90 tablet, Rfl: 1   Multiple Vitamins-Minerals (CENTRUM SILVER PO), Take 1 tablet by mouth daily., Disp: , Rfl:    nirmatrelvir/ritonavir (PAXLOVID) 20 x 150 MG & 10 x 100MG TABS, Take 3 tablets by mouth 2 (two) times daily for 5 days. (Take nirmatrelvir 150 mg two tablets twice daily for 5 days and ritonavir 100 mg one tablet twice daily for 5 days) Patient GFR is 78, Disp: 30 tablet, Rfl: 0   predniSONE (DELTASONE) 20 MG tablet, Take 1 tablet (20 mg total) by mouth 2 (two) times daily with a meal for 5 days., Disp: 10 tablet, Rfl: 0   triamcinolone cream (KENALOG) 0.1 %,  Apply to affected areas topically 2 (two) times daily., Disp: 30 g, Rfl: 0   Medications ordered in this encounter:  Meds ordered this encounter  Medications   DISCONTD: nirmatrelvir/ritonavir (PAXLOVID) 20 x 150 MG & 10 x 100MG TABS    Sig: Take 3 tablets by mouth 2 (two) times daily for 5 days. (Take nirmatrelvir 150 mg two tablets twice daily for 5 days and ritonavir 100 mg one tablet twice daily for 5 days) Patient GFR is 78    Dispense:  30 tablet    Refill:  0    Order Specific Question:   Supervising Provider    Answer:   Caryl Pina A [id number]   nirmatrelvir/ritonavir (PAXLOVID) 20 x 150 MG & 10 x 100MG TABS    Sig: Take 3 tablets by mouth 2 (two) times daily for 5 days. (Take nirmatrelvir 150 mg two tablets twice daily for 5 days and ritonavir 100 mg one tablet twice daily for 5 days) Patient GFR is 78    Dispense:  30 tablet    Refill:  0    Order Specific Question:   Supervising Provider    Answer:   Caryl Pina A [id number]     *If you need refills on other medications prior to your next appointment, please contact your pharmacy*  Follow-Up: Call back or seek an in-person evaluation if the symptoms worsen or if the condition fails  to improve as anticipated.  [place]  Other Instructions 1. Take meds as prescribed 2. Use a cool mist humidifier especially during the winter months and when heat has been humid. 3. Use saline nose sprays frequently 4. Saline irrigations of the nose can be very helpful if done frequently.  * 4X daily for 1 week*  * Use of a nettie pot can be helpful with this. Follow directions with this* 5. Drink plenty of fluids 6. Keep thermostat turn down low 7.For any cough or congestion- tessalon perles 8. For fever or aces or pains- take tylenol or ibuprofen appropriate for age and weight.  * for fevers greater than 101 orally you may alternate ibuprofen and tylenol every  3 hours.      If you have been  instructed to have an in-person evaluation today at a local Urgent Care facility, please use the link below. It will take you to a list of all of our available [facility], including address, phone number and hours of operation. Please do not delay care.  [facility]  If you or a family member do not have a primary care provider, use the link below to schedule a visit and establish care. When you choose a [facility] primary care physician or advanced practice provider, you gain a long-term partner in health. Find a Primary Care Provider  Learn more about [place]'s in-office and virtual care options: [facility]

## 2023-01-27 ENCOUNTER — Other Ambulatory Visit: Payer: Self-pay

## 2023-01-27 MED ORDER — FLUTICASONE PROPIONATE 50 MCG/ACT NA SUSP
2.0000 | Freq: Every day | NASAL | 6 refills | Status: DC
  Filled 2023-01-27: qty 16, 30d supply, fill #0

## 2023-02-09 ENCOUNTER — Other Ambulatory Visit: Payer: Self-pay

## 2023-02-09 ENCOUNTER — Other Ambulatory Visit (HOSPITAL_COMMUNITY): Payer: Self-pay

## 2023-03-14 ENCOUNTER — Telehealth: Payer: 59 | Admitting: Physician Assistant

## 2023-03-14 DIAGNOSIS — R3989 Other symptoms and signs involving the genitourinary system: Secondary | ICD-10-CM | POA: Diagnosis not present

## 2023-03-14 MED ORDER — CEPHALEXIN 500 MG PO CAPS: 500.0000 mg | ORAL_CAPSULE | Freq: Two times a day (BID) | ORAL | 0 refills | Status: AC

## 2023-03-14 NOTE — Progress Notes (Signed)
I have spent 5 minutes in review of e-visit questionnaire, review and updating patient chart, medical decision making and response to patient.   Kaidan Harpster Cody Daionna Crossland, PA-C    

## 2023-03-14 NOTE — Progress Notes (Signed)

## 2023-03-24 ENCOUNTER — Telehealth: Payer: Self-pay | Admitting: Nurse Practitioner

## 2023-03-24 NOTE — Telephone Encounter (Signed)
[employer] faxed FMLA forms to be completed and signed 03/24/23 Payment received 03/24/23 Forms have been placed in the Nurse Coordinators box for completion.

## 2023-04-11 NOTE — Telephone Encounter (Signed)
Pt asking for update on FMLA. Please call back

## 2023-04-12 NOTE — Telephone Encounter (Signed)
FMLA completed and signed [employer] forms. They have been faxed to  at fax number [phone]. Patient has been contacted and informed they are complete.

## 2023-04-15 ENCOUNTER — Ambulatory Visit: Payer: Self-pay | Admitting: Nurse Practitioner

## 2023-04-18 ENCOUNTER — Telehealth: Payer: 59 | Admitting: Nurse Practitioner

## 2023-04-18 DIAGNOSIS — R0982 Postnasal drip: Secondary | ICD-10-CM | POA: Diagnosis not present

## 2023-04-18 DIAGNOSIS — R051 Acute cough: Secondary | ICD-10-CM | POA: Diagnosis not present

## 2023-04-19 MED ORDER — FLUTICASONE PROPIONATE 50 MCG/ACT NA SUSP
2.0000 | Freq: Every day | NASAL | 6 refills | Status: DC
Start: 2023-04-19 — End: 2023-05-27

## 2023-04-19 MED ORDER — CETIRIZINE HCL 10 MG PO TABS
10.0000 mg | ORAL_TABLET | Freq: Every day | ORAL | 2 refills | Status: DC
Start: 2023-04-19 — End: 2023-05-27

## 2023-04-19 MED ORDER — BENZONATATE 100 MG PO CAPS
100.0000 mg | ORAL_CAPSULE | Freq: Three times a day (TID) | ORAL | 0 refills | Status: DC | PRN
Start: 2023-04-19 — End: 2023-05-27

## 2023-04-19 NOTE — Progress Notes (Signed)
E-Visit for Cough  We are sorry that you are not feeling well.  Here is how we plan to help!  Based on your presentation I believe you most likely have A cough due to allergies.  I recommend that you start the an over-the counter-allergy medication such as  Zyrtec 10 mg daily.     In addition you may use A prescription cough medication called Tessalon Perles 100mg . You may take 1-2 capsules every 8 hours as needed for your cough.  Perform warm salt water gargles throughout the day to loosen up any post nasal drainage  Use honey based cough drops or lozenges  Continue your Flonase daily    From your responses in the eVisit questionnaire you describe inflammation in the upper respiratory tract which is causing a significant cough.  This is commonly called Bronchitis and has four common causes:   Allergies Viral Infections Acid Reflux Bacterial Infection Allergies, viruses and acid reflux are treated by controlling symptoms or eliminating the cause. An example might be a cough caused by taking certain blood pressure medications. You stop the cough by changing the medication. Another example might be a cough caused by acid reflux. Controlling the reflux helps control the cough.  USE OF BRONCHODILATOR ("RESCUE") INHALERS: There is a risk from using your bronchodilator too frequently.  The risk is that over-reliance on a medication which only relaxes the muscles surrounding the breathing tubes can reduce the effectiveness of medications prescribed to reduce swelling and congestion of the tubes themselves.  Although you feel brief relief from the bronchodilator inhaler, your asthma may actually be worsening with the tubes becoming more swollen and filled with mucus.  This can delay other crucial treatments, such as oral steroid medications. If you need to use a bronchodilator inhaler daily, several times per day, you should discuss this with your provider.  There are probably better treatments that  could be used to keep your asthma under control.     HOME CARE Only take medications as instructed by your medical team. Complete the entire course of an antibiotic. Drink plenty of fluids and get plenty of rest. Avoid close contacts especially the very young and the elderly Cover your mouth if you cough or cough into your sleeve. Always remember to wash your hands A steam or ultrasonic humidifier can help congestion.   GET HELP RIGHT AWAY IF: You develop worsening fever. You become short of breath You cough up blood. Your symptoms persist after you have completed your treatment plan MAKE SURE YOU  Understand these instructions. Will watch your condition. Will get help right away if you are not doing well or get worse.    Thank you for choosing an e-visit.  Your e-visit answers were reviewed by a board certified advanced clinical practitioner to complete your personal care plan. Depending upon the condition, your plan could have included both over the counter or prescription medications.  Please review your pharmacy choice. Make sure the pharmacy is open so you can pick up prescription now. If there is a problem, you may contact your provider through [employer] and have the prescription routed to another pharmacy.  Your safety is important to [place]. If you have drug allergies check your prescription carefully.   For the next 24 hours you can use MyChart to ask questions about today's visit, request a non-urgent call back, or ask for a work or school excuse. You will get an email in the next two days asking about your experience.  I hope that your e-visit has been valuable and will speed your recovery.   I spent approximately 5 minutes reviewing the patient's history, current symptoms and coordinating their care today.

## 2023-04-27 ENCOUNTER — Telehealth: Payer: 59 | Admitting: Physician Assistant

## 2023-04-27 DIAGNOSIS — K219 Gastro-esophageal reflux disease without esophagitis: Secondary | ICD-10-CM

## 2023-04-27 NOTE — Progress Notes (Signed)
E-Visit for Cough  We are sorry that you are not feeling well.  Here is how we plan to help!  Based on your presentation I believe you most likely have A cough due to reflux. To lessen this cough you can use the over-the counter cough medication Delsym but it is very important that we control the cause of the cough.  I suggest that you begin Prilosec 20 mg twice a day for 2 weeks.  You should see the cough lessen quickly as your reflux ( which may be silent or without burning ) is treated.    In addition I want you to go ahead and schedule an in-person follow-up with your Primary Care provider for next week for further evaluation to see need for any ongoing treatments for reflux.   From your responses in the eVisit questionnaire you describe inflammation in the upper respiratory tract which is causing a significant cough.  This is commonly called Bronchitis and has four common causes:   Allergies Viral Infections Acid Reflux Bacterial Infection Allergies, viruses and acid reflux are treated by controlling symptoms or eliminating the cause. An example might be a cough caused by taking certain blood pressure medications. You stop the cough by changing the medication. Another example might be a cough caused by acid reflux. Controlling the reflux helps control the cough.  USE OF BRONCHODILATOR ("RESCUE") INHALERS: There is a risk from using your bronchodilator too frequently.  The risk is that over-reliance on a medication which only relaxes the muscles surrounding the breathing tubes can reduce the effectiveness of medications prescribed to reduce swelling and congestion of the tubes themselves.  Although you feel brief relief from the bronchodilator inhaler, your asthma may actually be worsening with the tubes becoming more swollen and filled with mucus.  This can delay other crucial treatments, such as oral steroid medications. If you need to use a bronchodilator inhaler daily, several times per day,  you should discuss this with your provider.  There are probably better treatments that could be used to keep your asthma under control.     HOME CARE Only take medications as instructed by your medical team. Complete the entire course of an antibiotic. Drink plenty of fluids and get plenty of rest. Avoid close contacts especially the very young and the elderly Cover your mouth if you cough or cough into your sleeve. Always remember to wash your hands A steam or ultrasonic humidifier can help congestion.   GET HELP RIGHT AWAY IF: You develop worsening fever. You become short of breath You cough up blood. Your symptoms persist after you have completed your treatment plan MAKE SURE YOU  Understand these instructions. Will watch your condition. Will get help right away if you are not doing well or get worse.    Thank you for choosing an e-visit.  Your e-visit answers were reviewed by a board certified advanced clinical practitioner to complete your personal care plan. Depending upon the condition, your plan could have included both over the counter or prescription medications.  Please review your pharmacy choice. Make sure the pharmacy is open so you can pick up prescription now. If there is a problem, you may contact your provider through [employer] and have the prescription routed to another pharmacy.  Your safety is important to [place]. If you have drug allergies check your prescription carefully.   For the next 24 hours you can use MyChart to ask questions about today's visit, request a non-urgent call back, or  ask for a work or school excuse. You will get an email in the next two days asking about your experience. I hope that your e-visit has been valuable and will speed your recovery.

## 2023-04-27 NOTE — Progress Notes (Signed)
I have spent 5 minutes in review of e-visit questionnaire, review and updating patient chart, medical decision making and response to patient.   Chinelo Benn Cody Jaidan Prevette, PA-C    

## 2023-05-10 ENCOUNTER — Other Ambulatory Visit (HOSPITAL_COMMUNITY): Payer: Self-pay

## 2023-05-10 ENCOUNTER — Other Ambulatory Visit: Payer: Self-pay

## 2023-05-11 ENCOUNTER — Other Ambulatory Visit (HOSPITAL_COMMUNITY): Payer: Self-pay

## 2023-05-27 ENCOUNTER — Other Ambulatory Visit: Payer: Self-pay

## 2023-05-27 ENCOUNTER — Other Ambulatory Visit (HOSPITAL_COMMUNITY): Payer: Self-pay

## 2023-05-27 ENCOUNTER — Ambulatory Visit (INDEPENDENT_AMBULATORY_CARE_PROVIDER_SITE_OTHER): Payer: 59 | Admitting: Nurse Practitioner

## 2023-05-27 ENCOUNTER — Encounter: Payer: Self-pay | Admitting: Nurse Practitioner

## 2023-05-27 VITALS — BP 126/85 | HR 80 | Temp 98.2°F | Resp 20 | Ht 62.0 in | Wt 162.0 lb

## 2023-05-27 DIAGNOSIS — M8580 Other specified disorders of bone density and structure, unspecified site: Secondary | ICD-10-CM | POA: Diagnosis not present

## 2023-05-27 DIAGNOSIS — L989 Disorder of the skin and subcutaneous tissue, unspecified: Secondary | ICD-10-CM | POA: Diagnosis not present

## 2023-05-27 DIAGNOSIS — E8881 Metabolic syndrome: Secondary | ICD-10-CM | POA: Diagnosis not present

## 2023-05-27 DIAGNOSIS — Z6828 Body mass index (BMI) 28.0-28.9, adult: Secondary | ICD-10-CM | POA: Diagnosis not present

## 2023-05-27 DIAGNOSIS — E785 Hyperlipidemia, unspecified: Secondary | ICD-10-CM | POA: Diagnosis not present

## 2023-05-27 DIAGNOSIS — R7989 Other specified abnormal findings of blood chemistry: Secondary | ICD-10-CM | POA: Diagnosis not present

## 2023-05-27 DIAGNOSIS — I10 Essential (primary) hypertension: Secondary | ICD-10-CM | POA: Diagnosis not present

## 2023-05-27 LAB — CMP14+EGFR
ALT: 14 IU/L (ref 0–32)
AST: 18 IU/L (ref 0–40)
Albumin: 4.4 g/dL (ref 3.9–4.9)
Alkaline Phosphatase: 114 IU/L (ref 44–121)
BUN/Creatinine Ratio: 8 — ABNORMAL LOW (ref 12–28)
BUN: 6 mg/dL — ABNORMAL LOW (ref 8–27)
Bilirubin Total: 0.5 mg/dL (ref 0.0–1.2)
CO2: 26 mmol/L (ref 20–29)
Calcium: 10 mg/dL (ref 8.7–10.3)
Chloride: 96 mmol/L (ref 96–106)
Creatinine, Ser: 0.74 mg/dL (ref 0.57–1.00)
Globulin, Total: 2.4 g/dL (ref 1.5–4.5)
Glucose: 108 mg/dL — ABNORMAL HIGH (ref 70–99)
Potassium: 3.8 mmol/L (ref 3.5–5.2)
Sodium: 139 mmol/L (ref 134–144)
Total Protein: 6.8 g/dL (ref 6.0–8.5)
eGFR: 91 mL/min/{1.73_m2} (ref >59)

## 2023-05-27 LAB — LIPID PANEL
Chol/HDL Ratio: 3.3 ratio (ref 0.0–4.4)
Cholesterol, Total: 180 mg/dL (ref 100–199)
HDL: 54 mg/dL (ref >39)
LDL Chol Calc (NIH): 97 mg/dL (ref 0–99)
Triglycerides: 171 mg/dL — ABNORMAL HIGH (ref 0–149)
VLDL Cholesterol Cal: 29 mg/dL (ref 5–40)

## 2023-05-27 LAB — CBC WITH DIFFERENTIAL/PLATELET
Basophils Absolute: 0 10*3/uL (ref 0.0–0.2)
Basos: 1 %
EOS (ABSOLUTE): 0.1 10*3/uL (ref 0.0–0.4)
Eos: 1 %
Hematocrit: 39.2 % (ref 34.0–46.6)
Hemoglobin: 12.8 g/dL (ref 11.1–15.9)
Immature Grans (Abs): 0 10*3/uL (ref 0.0–0.1)
Immature Granulocytes: 0 %
Lymphocytes Absolute: 1.4 10*3/uL (ref 0.7–3.1)
Lymphs: 29 %
MCH: 30.6 pg (ref 26.6–33.0)
MCHC: 32.7 g/dL (ref 31.5–35.7)
MCV: 94 fL (ref 79–97)
Monocytes Absolute: 0.5 10*3/uL (ref 0.1–0.9)
Monocytes: 10 %
Neutrophils Absolute: 2.9 10*3/uL (ref 1.4–7.0)
Neutrophils: 59 %
Platelets: 283 10*3/uL (ref 150–450)
RBC: 4.18 x10E6/uL (ref 3.77–5.28)
RDW: 11.9 % (ref 11.7–15.4)
WBC: 4.9 10*3/uL (ref 3.4–10.8)

## 2023-05-27 MED ORDER — ALPRAZOLAM 0.25 MG PO TABS
0.2500 mg | ORAL_TABLET | Freq: Two times a day (BID) | ORAL | 0 refills | Status: DC | PRN
  Filled 2023-05-27: qty 20, 10d supply, fill #0

## 2023-05-27 MED ORDER — FENOFIBRATE 145 MG PO TABS
145.0000 mg | ORAL_TABLET | Freq: Every day | ORAL | 1 refills | Status: DC
Start: 2023-05-27 — End: 2023-12-27
  Filled 2023-05-27 – 2023-08-22 (×2): qty 90, 90d supply, fill #0

## 2023-05-27 MED ORDER — HYDROCHLOROTHIAZIDE 25 MG PO TABS
25.0000 mg | ORAL_TABLET | Freq: Every day | ORAL | 1 refills | Status: DC
Start: 2023-05-27 — End: 2023-12-27
  Filled 2023-05-27: qty 90, 90d supply, fill #0

## 2023-05-27 MED ORDER — ATORVASTATIN CALCIUM 40 MG PO TABS
40.0000 mg | ORAL_TABLET | Freq: Every day | ORAL | 1 refills | Status: DC
Start: 2023-05-27 — End: 2023-12-27
  Filled 2023-05-27 – 2023-08-22 (×2): qty 90, 90d supply, fill #0

## 2023-05-27 NOTE — Patient Instructions (Signed)

## 2023-05-27 NOTE — Progress Notes (Signed)
Subjective:    Patient ID: Saunders Glance, female    DOB: September 02, 1961, 62 y.o.   MRN: [id number]   Chief Complaint: medical management of chronic issues     HPI:  DOLLIE BRESSI is a 62 y.o. who identifies as a female who was assigned female at birth.   Social history: Lives with: husband Work history: works for [employer] in today for follow up of the following chronic medical issues:  1. Essential hypertension No c/o chest  pain, sob or headache. Does not check blood pressure at home. BP Readings from Last 3 Encounters:  10/15/22 (!) 146/80  03/31/22 126/80  03/09/22 137/79     2. Hyperlipidemia with target LDL less than 100 Does not really watch diet and does no dedicated exercise.' Lab Results  Component Value Date   CHOL 154 10/15/2022   HDL 60 10/15/2022   LDLCALC 72 10/15/2022   TRIG 124 10/15/2022   CHOLHDL 2.6 10/15/2022     3. Metabolic syndrome She does not check her blood sugars at home. Lab Results  Component Value Date   HGBA1C 5.6 09/08/2021     4. Low serum vitamin D Is on daily vitamin d supplement  5. Osteopenia, unspecified location Last dexascan was done on 03/09/22. T score -2.0.  6. BMI 28.0-28.9,adult Weight is down 3lbs Wt Readings from Last 3 Encounters:  05/27/23 162 lb (73.5 kg)  10/15/22 165 lb (74.8 kg)  03/31/22 167 lb (75.8 kg)   BMI Readings from Last 3 Encounters:  05/27/23 29.63 kg/m  10/15/22 30.18 kg/m  03/31/22 30.54 kg/m      New complaints: Her mom is real sick and she is having a hard time dealing with it.    05/27/2023    9:13 AM 10/15/2022    8:08 AM 03/09/2022    8:08 AM 09/08/2021    8:15 AM  GAD 7 : Generalized Anxiety Score  Nervous, Anxious, on Edge 1 0 0 0  Control/stop worrying 1 0 0 0  Worry too much - different things 1 1 0 0  Trouble relaxing 1 0 0 0  Restless 1 0 0 0  Easily annoyed or irritable 1 0 0 0  Afraid - awful might happen 1 0 0 0  Total GAD 7 Score 7 1 0 0  Anxiety  Difficulty Somewhat difficult Not difficult at all Not difficult at all Not difficult at all      Allergies  Allergen Reactions   Morphine And Codeine Itching   Outpatient Encounter Medications as of 05/27/2023  Medication Sig   atorvastatin (LIPITOR) 40 MG tablet Take 1 tablet (40 mg total) by mouth daily.   benzonatate (TESSALON) 100 MG capsule Take 1 capsule (100 mg total) by mouth 3 (three) times daily as needed.   benzonatate (TESSALON) 200 MG capsule Take 1 capsule (200 mg total) by mouth 2 (two) times daily as needed for cough.   CALCIUM PO Take by mouth.   cetirizine (ZYRTEC ALLERGY) 10 MG tablet Take 1 tablet (10 mg total) by mouth at bedtime.   Cholecalciferol (VITAMIN D) 2000 units CAPS Take by mouth.   ELDERBERRY PO Take by mouth. + zinc & vitamin c   fenofibrate (TRICOR) 145 MG tablet Take 1 tablet (145 mg total) by mouth daily.   fluticasone (FLONASE) 50 MCG/ACT nasal spray Place 2 sprays into both nostrils daily.   fluticasone (FLONASE) 50 MCG/ACT nasal spray Place 2 sprays into both nostrils daily.  hydrochlorothiazide (HYDRODIURIL) 25 MG tablet Take 1 tablet (25 mg total) by mouth daily.   Multiple Vitamins-Minerals (CENTRUM SILVER PO) Take 1 tablet by mouth daily.   triamcinolone cream (KENALOG) 0.1 % Apply to affected areas topically 2 (two) times daily.   No facility-administered encounter medications on file as of 05/27/2023.    Past Surgical History:  Procedure Laterality Date   ANTERIOR CERVICAL DECOMP/DISCECTOMY FUSION  08/2006   CESAREAN SECTION  4098&[id number]   COLONOSCOPY  2002   Hospital in [facility]    TUBAL LIGATION  4782,9562   BTL    Family History  Problem Relation Age of Onset   Diabetes Mother    Hypertension Mother    Kidney disease Mother    Cancer Father        throat & lung-smoker   Stroke Father    Stroke Brother    Hypertension Brother    Cancer Brother 26       bladder   Stroke Brother    Early death Brother 9   Stroke Son     Hypertension Son    Colon cancer Neg Hx    Pancreatic cancer Neg Hx    Stomach cancer Neg Hx    Esophageal cancer Neg Hx    Breast cancer Neg Hx       Controlled substance contract: n/a     Review of Systems  Constitutional:  Negative for diaphoresis.  Eyes:  Negative for pain.  Respiratory:  Negative for shortness of breath.   Cardiovascular:  Negative for chest pain, palpitations and leg swelling.  Gastrointestinal:  Negative for abdominal pain.  Endocrine: Negative for polydipsia.  Skin:  Negative for rash.  Neurological:  Negative for dizziness, weakness and headaches.  Hematological:  Does not bruise/bleed easily.  All other systems reviewed and are negative.      Objective:   Physical Exam Vitals and nursing note reviewed.  Constitutional:      General: She is not in acute distress.    Appearance: Normal appearance. She is well-developed.  HENT:     Head: Normocephalic.     Right Ear: Tympanic membrane normal.     Left Ear: Tympanic membrane normal.     Nose: Nose normal.     Mouth/Throat:     Mouth: Mucous membranes are moist.  Eyes:     Pupils: Pupils are equal, round, and reactive to light.  Neck:     Vascular: No carotid bruit or JVD.  Cardiovascular:     Rate and Rhythm: Normal rate and regular rhythm.     Heart sounds: Normal heart sounds.  Pulmonary:     Effort: Pulmonary effort is normal. No respiratory distress.     Breath sounds: Normal breath sounds. No wheezing or rales.  Chest:     Chest wall: No tenderness.  Abdominal:     General: Bowel sounds are normal. There is no distension or abdominal bruit.     Palpations: Abdomen is soft. There is no hepatomegaly, splenomegaly, mass or pulsatile mass.     Tenderness: There is no abdominal tenderness.  Musculoskeletal:        General: Normal range of motion.     Cervical back: Normal range of motion and neck supple.  Lymphadenopathy:     Cervical: No cervical adenopathy.  Skin:    General:  Skin is warm and dry.     Comments: 1cm scaley facial lesion  Neurological:     Mental Status: She is alert and  oriented to person, place, and time.     Deep Tendon Reflexes: Reflexes are normal and symmetric.  Psychiatric:        Behavior: Behavior normal.        Thought Content: Thought content normal.        Judgment: Judgment normal.     BP 126/85   Pulse 80   Temp 98.2 F (36.8 C) (Temporal)   Resp 20   Ht 5\' 2"  (1.575 m)   Wt 162 lb (73.5 kg)   LMP 12/07/2007   SpO2 96%   BMI 29.63 kg/m        Assessment & Plan:  CHRISANN MELARAGNO comes in today with chief complaint of Medical Management of Chronic Issues   Diagnosis and orders addressed:  1. Essential hypertension Low sodium diet - hydrochlorothiazide (HYDRODIURIL) 25 MG tablet; Take 1 tablet (25 mg total) by mouth daily.  Dispense: 90 tablet; Refill: 1 - CBC with Differential/Platelet - CMP14+EGFR  2. Hyperlipidemia with target LDL less than 100 Low fat diet - atorvastatin (LIPITOR) 40 MG tablet; Take 1 tablet (40 mg total) by mouth daily.  Dispense: 90 tablet; Refill: 1 - fenofibrate (TRICOR) 145 MG tablet; Take 1 tablet (145 mg total) by mouth daily.  Dispense: 90 tablet; Refill: 1 - Lipid panel  3. Metabolic syndrome Low carb diet  4. Low serum vitamin D Continue vitamin d supplement  5. Osteopenia, unspecified location Weight bearing exercise  6. BMI 28.0-28.9,adult Discussed diet and exercise for person with BMI >25 Will recheck weight in 3-6 months   7. Facial lesion Do not pick or scratch at area - Ambulatory referral to Dermatology   Labs pending Health Maintenance reviewed Diet and exercise encouraged  Follow up plan: 6 months   Mary-Margaret Daphine Deutscher, FNP

## 2023-06-15 ENCOUNTER — Other Ambulatory Visit: Payer: Self-pay | Admitting: Oncology

## 2023-06-15 DIAGNOSIS — Z006 Encounter for examination for normal comparison and control in clinical research program: Secondary | ICD-10-CM

## 2023-08-16 DIAGNOSIS — H25813 Combined forms of age-related cataract, bilateral: Secondary | ICD-10-CM | POA: Diagnosis not present

## 2023-08-23 ENCOUNTER — Other Ambulatory Visit: Payer: Self-pay

## 2023-08-23 ENCOUNTER — Other Ambulatory Visit (HOSPITAL_COMMUNITY): Payer: Self-pay

## 2023-08-25 DIAGNOSIS — H25811 Combined forms of age-related cataract, right eye: Secondary | ICD-10-CM | POA: Diagnosis not present

## 2023-08-25 DIAGNOSIS — H25812 Combined forms of age-related cataract, left eye: Secondary | ICD-10-CM | POA: Diagnosis not present

## 2023-08-25 HISTORY — PX: CATARACT EXTRACTION: SUR2

## 2023-08-31 ENCOUNTER — Ambulatory Visit (INDEPENDENT_AMBULATORY_CARE_PROVIDER_SITE_OTHER): Payer: 59 | Admitting: Nurse Practitioner

## 2023-08-31 ENCOUNTER — Encounter: Payer: Self-pay | Admitting: Nurse Practitioner

## 2023-08-31 VITALS — BP 144/81 | HR 68 | Temp 97.7°F | Ht 62.0 in | Wt 161.6 lb

## 2023-08-31 DIAGNOSIS — R8290 Unspecified abnormal findings in urine: Secondary | ICD-10-CM | POA: Insufficient documentation

## 2023-08-31 DIAGNOSIS — K219 Gastro-esophageal reflux disease without esophagitis: Secondary | ICD-10-CM | POA: Insufficient documentation

## 2023-08-31 DIAGNOSIS — N3000 Acute cystitis without hematuria: Secondary | ICD-10-CM | POA: Insufficient documentation

## 2023-08-31 LAB — URINALYSIS, ROUTINE W REFLEX MICROSCOPIC
Bilirubin, UA: NEGATIVE
Glucose, UA: NEGATIVE
Ketones, UA: NEGATIVE
Nitrite, UA: NEGATIVE
Protein,UA: NEGATIVE
Specific Gravity, UA: 1.015 (ref 1.005–1.030)
Urobilinogen, Ur: 0.2 mg/dL (ref 0.2–1.0)
pH, UA: 7 (ref 5.0–7.5)

## 2023-08-31 LAB — MICROSCOPIC EXAMINATION
Renal Epithel, UA: NONE SEEN /hpf
Yeast, UA: NONE SEEN

## 2023-08-31 MED ORDER — PANTOPRAZOLE SODIUM 20 MG PO TBEC
20.0000 mg | DELAYED_RELEASE_TABLET | Freq: Every day | ORAL | 1 refills | Status: DC
Start: 2023-08-31 — End: 2023-12-27

## 2023-08-31 MED ORDER — DOXYCYCLINE HYCLATE 100 MG PO CAPS
100.0000 mg | ORAL_CAPSULE | Freq: Two times a day (BID) | ORAL | 0 refills | Status: AC
Start: 2023-08-31 — End: ?

## 2023-08-31 NOTE — Progress Notes (Signed)
Established Patient Office Visit  Subjective   Patient ID: Tanya Wilkinson, female    DOB: 1961/02/02  Age: 62 y.o. MRN: [id number]  Chief Complaint  Patient presents with   Gastroesophageal Reflux    Has had acid reflux for a while but really bad today. Feels like something is in her throat and makes her cough and gag. Has been taking lansoprazole with minimal relief    cloudy urine    Cloudy urine for couple days no other urinary symptoms.     HPI GERD: Paitent complains of heartburn. This has been associated with abdominal bloating, belching, bilious reflux, and cough.  She denies cough, fullness after meals, and hoarseness. Symptoms have been present for 6 days. She denies dysphagia.  She has not lost weight. She denies melena, hematochezia, hematemesis, and coffee ground emesis. Medical therapy in the past has included antacids. "Back in May I had the same issues and was prescribed  Lansoprazole that provided minimal relief"  Urinary Tract Infection: Patient complains of constipation, foul smelling urine, and incomplete bladder emptying She has had symptoms for 3 days. Patient also complains of cough. Patient denies congestion, fever, rhinitis, sorethroat, and vaginal discharge. Patient does not have a history of recurrent UTI.  Patient does not have a history of pyelonephritis.    Patient Active Problem List   Diagnosis Date Noted   Gastroesophageal reflux disease without esophagitis 08/31/2023   Cloudy urine 08/31/2023   Acute cystitis without hematuria 08/31/2023   Essential hypertension 02/26/2020   BMI 28.0-28.9,adult 06/29/2016   Osteopenia 09/04/2014   Low serum vitamin D 09/04/2014   Hyperlipidemia with target LDL less than 100 12/26/2013   Metabolic syndrome 12/26/2013   Past Medical History:  Diagnosis Date   Herniated disc    Hyperlipidemia    Osteopenia    Vitamin D deficiency    Past Surgical History:  Procedure Laterality Date   ANTERIOR CERVICAL  DECOMP/DISCECTOMY FUSION  08/06/2006   CATARACT EXTRACTION Right 08/25/2023   CESAREAN SECTION  [id number]   COLONOSCOPY  12/06/2000   Hospital in [place]    TUBAL LIGATION  [id number]   BTL   Social History   Tobacco Use   Smoking status: Former    Current packs/day: 0.00    Types: Cigarettes    Quit date: 12/06/1994    Years since quitting: 28.7   Smokeless tobacco: Never  Vaping Use   Vaping status: Never Used  Substance Use Topics   Alcohol use: No   Drug use: No   Social History   Socioeconomic History   Marital status: Married    Spouse name: Not on file   Number of children: Not on file   Years of education: Not on file   Highest education level: Not on file  Occupational History   Not on file  Tobacco Use   Smoking status: Former    Current packs/day: 0.00    Types: Cigarettes    Quit date: 12/06/1994    Years since quitting: 28.7   Smokeless tobacco: Never  Vaping Use   Vaping status: Never Used  Substance and Sexual Activity   Alcohol use: No   Drug use: No   Sexual activity: Yes    Partners: Male    Birth control/protection: Surgical, Post-menopausal    Comment: BTL  Other Topics Concern   Not on file  Social History Narrative   Not on file   Social Determinants of Health   Financial Resource Strain: Not on  file  Food Insecurity: Not on file  Transportation Needs: Not on file  Physical Activity: Not on file  Stress: Not on file  Social Connections: Not on file  Intimate Partner Violence: Not on file   Family Status  Relation Name Status   Mother Alfonso Ramus Alive   Father Lindy Mabe Deceased   Brother Harvie Heck Deceased at age 3       bladder cancer   Brother  Alive   Brother  Deceased   Sister Lawson Fiscal Mabe Alive   Brother  Alive   Son  Deceased   Neg Hx  (Not Specified)  No partnership data on file   Family History  Problem Relation Age of Onset   Diabetes Mother    Hypertension Mother    Kidney disease Mother    Cancer Father         throat & lung-smoker   Stroke Father    Stroke Brother    Hypertension Brother    Cancer Brother 77       bladder   Stroke Brother    Early death Brother 9   Stroke Son    Hypertension Son    Colon cancer Neg Hx    Pancreatic cancer Neg Hx    Stomach cancer Neg Hx    Esophageal cancer Neg Hx    Breast cancer Neg Hx    Allergies  Allergen Reactions   Morphine And Codeine Itching      Review of Systems  Constitutional:  Negative for chills and fever.  HENT:  Negative for ear pain and sore throat.   Eyes:  Negative for double vision and pain.  Respiratory:  Negative for cough and shortness of breath.   Cardiovascular:  Negative for chest pain and leg swelling.  Gastrointestinal:  Negative for blood in stool, melena, nausea and vomiting.  Genitourinary:  Positive for frequency, hematuria and urgency.  Musculoskeletal:  Negative for falls and myalgias.  Skin:  Negative for itching and rash.  Neurological:  Negative for dizziness and headaches.  Endo/Heme/Allergies:  Negative for environmental allergies and polydipsia. Does not bruise/bleed easily.  Psychiatric/Behavioral:  Negative for hallucinations and suicidal ideas.    Negative unless indicated in HPI   Objective:     BP (!) 144/81   Pulse 68   Temp 97.7 F (36.5 C) (Temporal)   Ht 5\' 2"  (1.575 m)   Wt 161 lb 9.6 oz (73.3 kg)   LMP 12/07/2007   SpO2 99%   BMI 29.56 kg/m  BP Readings from Last 3 Encounters:  08/31/23 (!) 144/81  05/27/23 126/85  10/15/22 (!) 146/80   Wt Readings from Last 3 Encounters:  08/31/23 161 lb 9.6 oz (73.3 kg)  05/27/23 162 lb (73.5 kg)  10/15/22 165 lb (74.8 kg)      Physical Exam Vitals and nursing note reviewed.  Constitutional:      Appearance: Normal appearance.  HENT:     Head: Normocephalic and atraumatic.  Eyes:     General: No scleral icterus.    Extraocular Movements: Extraocular movements intact.     Conjunctiva/sclera: Conjunctivae normal.     Pupils: Pupils  are equal, round, and reactive to light.  Cardiovascular:     Rate and Rhythm: Normal rate and regular rhythm.  Pulmonary:     Effort: Pulmonary effort is normal.     Breath sounds: Normal breath sounds.  Abdominal:     General: Bowel sounds are normal.     Palpations: Abdomen is  soft.     Tenderness: There is no abdominal tenderness. There is no right CVA tenderness or left CVA tenderness.     Hernia: No hernia is present.  Musculoskeletal:        General: Normal range of motion.     Right lower leg: No edema.     Left lower leg: No edema.  Skin:    General: Skin is warm and dry.     Findings: No rash.  Neurological:     Mental Status: She is alert and oriented to person, place, and time. Mental status is at baseline.  Psychiatric:        Mood and Affect: Mood normal.        Behavior: Behavior normal.        Thought Content: Thought content normal.        Judgment: Judgment normal.    Urine dipstick shows positive for 3+WBC's and positive for RBC's 1+.   Micro exam: 6-10 WBC's per HPF, 0-2 RBC's per HPF, few bacteria, and epithelial cells 0-10 Results for orders placed or performed in visit on 08/31/23  Microscopic Examination   Urine  Result Value Ref Range   WBC, UA 6-10 (A) 0 - 5 /hpf   RBC, Urine 0-2 0 - 2 /hpf   Epithelial Cells (non renal) 0-10 0 - 10 /hpf   Renal Epithel, UA None seen None seen /hpf   Bacteria, UA Few (A) None seen/Few   Yeast, UA None seen None seen  Urinalysis, Routine w reflex microscopic  Result Value Ref Range   Specific Gravity, UA 1.015 1.005 - 1.030   pH, UA 7.0 5.0 - 7.5   Color, UA Yellow Yellow   Appearance Ur Cloudy (A) Clear   Leukocytes,UA 3+ (A) Negative   Protein,UA Negative Negative/Trace   Glucose, UA Negative Negative   Ketones, UA Negative Negative   RBC, UA 1+ (A) Negative   Bilirubin, UA Negative Negative   Urobilinogen, Ur 0.2 0.2 - 1.0 mg/dL   Nitrite, UA Negative Negative   Microscopic Examination See below:      Last CBC Lab Results  Component Value Date   WBC 4.9 05/27/2023   HGB 12.8 05/27/2023   HCT 39.2 05/27/2023   MCV 94 05/27/2023   MCH 30.6 05/27/2023   RDW 11.9 05/27/2023   PLT 283 05/27/2023   Last metabolic panel Lab Results  Component Value Date   GLUCOSE 108 (H) 05/27/2023   NA 139 05/27/2023   K 3.8 05/27/2023   CL 96 05/27/2023   CO2 26 05/27/2023   BUN 6 (L) 05/27/2023   CREATININE 0.74 05/27/2023   EGFR 91 05/27/2023   CALCIUM 10.0 05/27/2023   PROT 6.8 05/27/2023   ALBUMIN 4.4 05/27/2023   LABGLOB 2.4 05/27/2023   AGRATIO 2.2 10/15/2022   BILITOT 0.5 05/27/2023   ALKPHOS 114 05/27/2023   AST 18 05/27/2023   ALT 14 05/27/2023   Last lipids Lab Results  Component Value Date   CHOL 180 05/27/2023   HDL 54 05/27/2023   LDLCALC 97 05/27/2023   TRIG 171 (H) 05/27/2023   CHOLHDL 3.3 05/27/2023   Last hemoglobin A1c Lab Results  Component Value Date   HGBA1C 5.6 09/08/2021   Last thyroid functions Lab Results  Component Value Date   TSH 3.660 03/28/2018   T4TOTAL 7.1 03/28/2018        Assessment & Plan:  Cloudy urine -     Urinalysis, Routine w reflex microscopic -  Doxycycline Hyclate; Take 1 capsule (100 mg total) by mouth 2 (two) times daily.  Dispense: 14 capsule; Refill: 0 -     Urine Culture  Acute cystitis without hematuria -     Doxycycline Hyclate; Take 1 capsule (100 mg total) by mouth 2 (two) times daily.  Dispense: 14 capsule; Refill: 0 -     Urine Culture  Gastroesophageal reflux disease without esophagitis -     Pantoprazole Sodium; Take 1 tablet (20 mg total) by mouth daily.  Dispense: 30 tablet; Refill: 1  Other orders -     Microscopic Examination   Elin is a 62 yrs old female, no acute distress GERD: Start pantoprazole 20 mg Daily Avoid caffeine, mint, chocolate spicy/citrus foods Acute cystitis: PLAN: Doxy 100 mg BID for  7-days- also push fluids, may use Pyridium OTC prn.  Call or return to clinic prn if these  symptoms worsen or fail to improve as anticipated.  Return for as schedules with PCP.    Arrie Aran Santa Lighter, DNP [facility] [address] [place], [place] [address] [phone]

## 2023-09-03 LAB — URINE CULTURE

## 2023-09-05 ENCOUNTER — Other Ambulatory Visit (HOSPITAL_COMMUNITY): Payer: Self-pay

## 2023-09-05 ENCOUNTER — Other Ambulatory Visit: Payer: Self-pay | Admitting: Nurse Practitioner

## 2023-09-05 DIAGNOSIS — R8290 Unspecified abnormal findings in urine: Secondary | ICD-10-CM

## 2023-09-05 DIAGNOSIS — R8279 Other abnormal findings on microbiological examination of urine: Secondary | ICD-10-CM

## 2023-09-05 MED ORDER — NITROFURANTOIN MONOHYD MACRO 100 MG PO CAPS
100.0000 mg | ORAL_CAPSULE | Freq: Two times a day (BID) | ORAL | 0 refills | Status: DC
Start: 2023-09-05 — End: 2023-12-27
  Filled 2023-09-05 (×2): qty 20, 10d supply, fill #0

## 2023-09-14 ENCOUNTER — Other Ambulatory Visit: Payer: Self-pay | Admitting: Nurse Practitioner

## 2023-09-14 DIAGNOSIS — Z1231 Encounter for screening mammogram for malignant neoplasm of breast: Secondary | ICD-10-CM

## 2023-10-12 ENCOUNTER — Ambulatory Visit
Admission: RE | Admit: 2023-10-12 | Discharge: 2023-10-12 | Disposition: A | Discharge status: Home / Self Care | Payer: 59 | Source: Ambulatory Visit | Attending: Nurse Practitioner | Admitting: Nurse Practitioner

## 2023-10-12 DIAGNOSIS — Z1231 Encounter for screening mammogram for malignant neoplasm of breast: Secondary | ICD-10-CM

## 2023-11-28 ENCOUNTER — Ambulatory Visit: Payer: 59 | Admitting: Nurse Practitioner

## 2023-12-27 ENCOUNTER — Other Ambulatory Visit (HOSPITAL_COMMUNITY): Payer: Self-pay

## 2023-12-27 ENCOUNTER — Encounter: Payer: Self-pay | Admitting: Nurse Practitioner

## 2023-12-27 ENCOUNTER — Other Ambulatory Visit: Payer: Self-pay

## 2023-12-27 ENCOUNTER — Ambulatory Visit: Payer: Commercial Managed Care - PPO | Admitting: Nurse Practitioner

## 2023-12-27 VITALS — BP 149/83 | HR 66 | Temp 97.8°F | Ht 62.0 in | Wt 164.0 lb

## 2023-12-27 DIAGNOSIS — R7989 Other specified abnormal findings of blood chemistry: Secondary | ICD-10-CM

## 2023-12-27 DIAGNOSIS — I10 Essential (primary) hypertension: Secondary | ICD-10-CM | POA: Diagnosis not present

## 2023-12-27 DIAGNOSIS — Z6828 Body mass index (BMI) 28.0-28.9, adult: Secondary | ICD-10-CM

## 2023-12-27 DIAGNOSIS — M8580 Other specified disorders of bone density and structure, unspecified site: Secondary | ICD-10-CM

## 2023-12-27 DIAGNOSIS — K219 Gastro-esophageal reflux disease without esophagitis: Secondary | ICD-10-CM

## 2023-12-27 DIAGNOSIS — E8881 Metabolic syndrome: Secondary | ICD-10-CM | POA: Diagnosis not present

## 2023-12-27 DIAGNOSIS — E785 Hyperlipidemia, unspecified: Secondary | ICD-10-CM | POA: Diagnosis not present

## 2023-12-27 LAB — LIPID PANEL

## 2023-12-27 LAB — BAYER DCA HB A1C WAIVED: HB A1C (BAYER DCA - WAIVED): 5.7 % — ABNORMAL HIGH (ref 4.8–5.6)

## 2023-12-27 MED ORDER — ATORVASTATIN CALCIUM 40 MG PO TABS
40.0000 mg | ORAL_TABLET | Freq: Every day | ORAL | 1 refills | Status: DC
  Filled 2023-12-27: qty 90, 90d supply, fill #0
  Filled 2024-04-03: qty 90, 90d supply, fill #1

## 2023-12-27 MED ORDER — HYDROCHLOROTHIAZIDE 25 MG PO TABS
25.0000 mg | ORAL_TABLET | Freq: Every day | ORAL | 1 refills | Status: DC
  Filled 2023-12-27: qty 90, 90d supply, fill #0
  Filled 2024-04-03: qty 90, 90d supply, fill #1

## 2023-12-27 MED ORDER — PANTOPRAZOLE SODIUM 20 MG PO TBEC
20.0000 mg | DELAYED_RELEASE_TABLET | Freq: Every day | ORAL | 1 refills | Status: DC
  Filled 2023-12-27: qty 30, 30d supply, fill #0
  Filled 2024-04-03: qty 30, 30d supply, fill #1

## 2023-12-27 MED ORDER — FENOFIBRATE 145 MG PO TABS
145.0000 mg | ORAL_TABLET | Freq: Every day | ORAL | 1 refills | Status: DC
  Filled 2023-12-27: qty 90, 90d supply, fill #0
  Filled 2024-04-03: qty 90, 90d supply, fill #1

## 2023-12-27 MED ORDER — ALPRAZOLAM 0.25 MG PO TABS
0.2500 mg | ORAL_TABLET | Freq: Two times a day (BID) | ORAL | 1 refills | Status: AC | PRN
  Filled 2023-12-27: qty 20, 10d supply, fill #0

## 2023-12-27 NOTE — Progress Notes (Signed)
Subjective:    Patient ID: Tanya Wilkinson, female    DOB: [birth year], 63 y.o.   MRN: [id number]   Chief Complaint: medical management of chronic issues     HPI:  Tanya Wilkinson is a 63 y.o. who identifies as a female who was assigned female at birth.   Social history: Lives with: husband Work history: works for [employer] in today for follow up of the following chronic medical issues:  1. Essential hypertension No c/o chest  pain, sob or headache. Does not check blood pressure at home. BP Readings from Last 3 Encounters:  08/31/23 (!) 144/81  05/27/23 126/85  10/15/22 (!) 146/80     2. Hyperlipidemia with target LDL less than 100 Does not really watch diet and does no dedicated exercise.' Lab Results  Component Value Date   CHOL 180 05/27/2023   HDL 54 05/27/2023   LDLCALC 97 05/27/2023   TRIG 171 (H) 05/27/2023   CHOLHDL 3.3 05/27/2023     3. Metabolic syndrome She does not check her blood sugars at home. Lab Results  Component Value Date   HGBA1C 5.6 09/08/2021     4. Low serum vitamin D Is on daily vitamin d supplement  5. Osteopenia, unspecified location Last dexascan was done on 03/09/22. T score -2.0.  6. BMI 28.0-28.9,adult Weight is up 3lbs  Wt Readings from Last 3 Encounters:  12/27/23 164 lb (74.4 kg)  08/31/23 161 lb 9.6 oz (73.3 kg)  05/27/23 162 lb (73.5 kg)   BMI Readings from Last 3 Encounters:  12/27/23 30.00 kg/m  08/31/23 29.56 kg/m  05/27/23 29.63 kg/m       New complaints: None today   Allergies  Allergen Reactions   Morphine And Codeine Itching   Outpatient Encounter Medications as of 12/27/2023  Medication Sig   ALPRAZolam (XANAX) 0.25 MG tablet Take 1 tablet (0.25 mg total) by mouth 2 (two) times daily as needed for anxiety.   atorvastatin (LIPITOR) 40 MG tablet Take 1 tablet (40 mg total) by mouth daily.   CALCIUM PO Take by mouth.   Cholecalciferol (VITAMIN D) 2000 units CAPS Take by mouth.    ELDERBERRY PO Take by mouth. + zinc & vitamin c   fenofibrate (TRICOR) 145 MG tablet Take 1 tablet (145 mg total) by mouth daily.   fluticasone (FLONASE) 50 MCG/ACT nasal spray Place 2 sprays into both nostrils daily.   hydrochlorothiazide (HYDRODIURIL) 25 MG tablet Take 1 tablet (25 mg total) by mouth daily.   Multiple Vitamins-Minerals (CENTRUM SILVER PO) Take 1 tablet by mouth daily.   nitrofurantoin, macrocrystal-monohydrate, (MACROBID) 100 MG capsule Take 1 capsule (100 mg total) by mouth 2 (two) times daily.   pantoprazole (PROTONIX) 20 MG tablet Take 1 tablet (20 mg total) by mouth daily.   triamcinolone cream (KENALOG) 0.1 % Apply to affected areas topically 2 (two) times daily.   No facility-administered encounter medications on file as of 12/27/2023.    Past Surgical History:  Procedure Laterality Date   ANTERIOR CERVICAL DECOMP/DISCECTOMY FUSION  08/06/2006   CATARACT EXTRACTION Right 08/25/2023   CESAREAN SECTION  [id number]   COLONOSCOPY  12/06/2000   Hospital in [place]    TUBAL LIGATION  [id number],0272   BTL    Family History  Problem Relation Age of Onset   Diabetes Mother    Hypertension Mother    Kidney disease Mother    Cancer Father        throat & lung-smoker  Stroke Father    Stroke Brother    Hypertension Brother    Cancer Brother 67       bladder   Stroke Brother    Early death Brother 9   Stroke Son    Hypertension Son    Colon cancer Neg Hx    Pancreatic cancer Neg Hx    Stomach cancer Neg Hx    Esophageal cancer Neg Hx    Breast cancer Neg Hx       Controlled substance contract: n/a     Review of Systems  Constitutional:  Negative for diaphoresis.  Eyes:  Negative for pain.  Respiratory:  Negative for shortness of breath.   Cardiovascular:  Negative for chest pain, palpitations and leg swelling.  Gastrointestinal:  Negative for abdominal pain.  Endocrine: Negative for polydipsia.  Skin:  Negative for rash.  Neurological:  Negative for  dizziness, weakness and headaches.  Hematological:  Does not bruise/bleed easily.  All other systems reviewed and are negative.      Objective:   Physical Exam Vitals and nursing note reviewed.  Constitutional:      General: She is not in acute distress.    Appearance: Normal appearance. She is well-developed.  HENT:     Head: Normocephalic.     Right Ear: Tympanic membrane normal.     Left Ear: Tympanic membrane normal.     Nose: Nose normal.     Mouth/Throat:     Mouth: Mucous membranes are moist.  Eyes:     Pupils: Pupils are equal, round, and reactive to light.  Neck:     Vascular: No carotid bruit or JVD.  Cardiovascular:     Rate and Rhythm: Normal rate and regular rhythm.     Heart sounds: Normal heart sounds.  Pulmonary:     Effort: Pulmonary effort is normal. No respiratory distress.     Breath sounds: Normal breath sounds. No wheezing or rales.  Chest:     Chest wall: No tenderness.  Abdominal:     General: Bowel sounds are normal. There is no distension or abdominal bruit.     Palpations: Abdomen is soft. There is no hepatomegaly, splenomegaly, mass or pulsatile mass.     Tenderness: There is no abdominal tenderness.  Musculoskeletal:        General: Normal range of motion.     Cervical back: Normal range of motion and neck supple.  Lymphadenopathy:     Cervical: No cervical adenopathy.  Skin:    General: Skin is warm and dry.     Comments: 1cm scaley facial lesion  Neurological:     Mental Status: She is alert and oriented to person, place, and time.     Deep Tendon Reflexes: Reflexes are normal and symmetric.  Psychiatric:        Behavior: Behavior normal.        Thought Content: Thought content normal.        Judgment: Judgment normal.     LMP 12/07/2007   BP (!) 149/83   Pulse 66   Temp 97.8 F (36.6 C) (Temporal)   Ht 5\' 2"  (1.575 m)   Wt 164 lb (74.4 kg)   LMP 12/07/2007   SpO2 99%   BMI 30.00 kg/m       Assessment & Plan:  Tanya Wilkinson comes in today with chief complaint of No chief complaint on file.   Diagnosis and orders addressed:  1. Essential hypertension Low sodium diet -  hydrochlorothiazide (HYDRODIURIL) 25 MG tablet; Take 1 tablet (25 mg total) by mouth daily.  Dispense: 90 tablet; Refill: 1 - CBC with Differential/Platelet - CMP14+EGFR  2. Hyperlipidemia with target LDL less than 100 Low fat diet - atorvastatin (LIPITOR) 40 MG tablet; Take 1 tablet (40 mg total) by mouth daily.  Dispense: 90 tablet; Refill: 1 - fenofibrate (TRICOR) 145 MG tablet; Take 1 tablet (145 mg total) by mouth daily.  Dispense: 90 tablet; Refill: 1 - Lipid panel  3. Metabolic syndrome Low carb diet  4. Low serum vitamin D Continue vitamin d supplement  5. Osteopenia, unspecified location Weight bearing exercise  6. BMI 28.0-28.9,adult Discussed diet and exercise for person with BMI >25 Will recheck weight in 3-6 months   7. Facial lesion Do not pick or scratch at area - Ambulatory referral to Dermatology   Labs pending Health Maintenance reviewed Diet and exercise encouraged  Follow up plan: 6 months   Mary-Margaret Daphine Deutscher, FNP

## 2023-12-28 LAB — CBC WITH DIFFERENTIAL/PLATELET
Basophils Absolute: 0 10*3/uL (ref 0.0–0.2)
Basos: 1 %
EOS (ABSOLUTE): 0.1 10*3/uL (ref 0.0–0.4)
Eos: 2 %
Hematocrit: 36.6 % (ref 34.0–46.6)
Hemoglobin: 12 g/dL (ref 11.1–15.9)
Immature Grans (Abs): 0 10*3/uL (ref 0.0–0.1)
Immature Granulocytes: 0 %
Lymphocytes Absolute: 1.7 10*3/uL (ref 0.7–3.1)
Lymphs: 36 %
MCH: 30.4 pg (ref 26.6–33.0)
MCHC: 32.8 g/dL (ref 31.5–35.7)
MCV: 93 fL (ref 79–97)
Monocytes Absolute: 0.4 10*3/uL (ref 0.1–0.9)
Monocytes: 8 %
Neutrophils Absolute: 2.6 10*3/uL (ref 1.4–7.0)
Neutrophils: 53 %
Platelets: 256 10*3/uL (ref 150–450)
RBC: 3.95 x10E6/uL (ref 3.77–5.28)
RDW: 11.6 % — ABNORMAL LOW (ref 11.7–15.4)
WBC: 4.8 10*3/uL (ref 3.4–10.8)

## 2023-12-28 LAB — CMP14+EGFR
ALT: 13 IU/L (ref 0–32)
AST: 18 IU/L (ref 0–40)
Albumin: 4.2 g/dL (ref 3.9–4.9)
Alkaline Phosphatase: 115 IU/L (ref 44–121)
BUN/Creatinine Ratio: 14 (ref 12–28)
BUN: 9 mg/dL (ref 8–27)
Bilirubin Total: 0.4 mg/dL (ref 0.0–1.2)
CO2: 26 mmol/L (ref 20–29)
Calcium: 9.6 mg/dL (ref 8.7–10.3)
Chloride: 103 mmol/L (ref 96–106)
Creatinine, Ser: 0.65 mg/dL (ref 0.57–1.00)
Globulin, Total: 2.2 g/dL (ref 1.5–4.5)
Glucose: 99 mg/dL (ref 70–99)
Potassium: 4.6 mmol/L (ref 3.5–5.2)
Sodium: 143 mmol/L (ref 134–144)
Total Protein: 6.4 g/dL (ref 6.0–8.5)
eGFR: 98 mL/min/{1.73_m2} (ref >59)

## 2023-12-28 LAB — LIPID PANEL
Cholesterol, Total: 166 mg/dL (ref 100–199)
HDL: 47 mg/dL (ref >39)
LDL CALC COMMENT:: 3.5 ratio (ref 0.0–4.4)
LDL Chol Calc (NIH): 95 mg/dL (ref 0–99)
Triglycerides: 135 mg/dL (ref 0–149)
VLDL Cholesterol Cal: 24 mg/dL (ref 5–40)

## 2023-12-29 ENCOUNTER — Other Ambulatory Visit (HOSPITAL_COMMUNITY): Payer: Self-pay

## 2023-12-29 ENCOUNTER — Other Ambulatory Visit: Payer: Self-pay

## 2023-12-29 MED ORDER — BLOOD GLUCOSE TEST VI STRP
1.0000 | ORAL_STRIP | Freq: Three times a day (TID) | 0 refills | Status: AC
  Filled 2023-12-29: qty 100, 34d supply, fill #0

## 2023-12-29 MED ORDER — LANCET DEVICE MISC
1.0000 | Freq: Three times a day (TID) | 0 refills | Status: AC
  Filled 2023-12-29: qty 1, 30d supply, fill #0

## 2023-12-29 MED ORDER — BLOOD GLUCOSE MONITOR SYSTEM W/DEVICE KIT
1.0000 | PACK | Freq: Three times a day (TID) | 0 refills | Status: AC
Start: 2023-12-29 — End: ?
  Filled 2023-12-29: qty 1, 30d supply, fill #0

## 2023-12-29 MED ORDER — FREESTYLE LANCETS MISC
1.0000 | Freq: Three times a day (TID) | 0 refills | Status: AC
  Filled 2023-12-29: qty 100, 33d supply, fill #0

## 2024-01-30 ENCOUNTER — Telehealth: Payer: 59 | Admitting: Physician Assistant

## 2024-01-30 DIAGNOSIS — J208 Acute bronchitis due to other specified organisms: Secondary | ICD-10-CM

## 2024-01-30 MED ORDER — BENZONATATE 100 MG PO CAPS: 100.0000 mg | ORAL_CAPSULE | Freq: Three times a day (TID) | ORAL | 0 refills | Status: DC | PRN

## 2024-01-30 MED ORDER — PREDNISONE 10 MG (21) PO TBPK: ORAL_TABLET | ORAL | 0 refills | Status: DC

## 2024-01-30 NOTE — Progress Notes (Signed)
 E-Visit for Cough  We are sorry that you are not feeling well.  Here is how we plan to help!  Based on your presentation I believe you most likely have A cough due to a virus.  This is called viral bronchitis and is best treated by rest, plenty of fluids and control of the cough.  You may use Ibuprofen or Tylenol as directed to help your symptoms.     In addition you may use A non-prescription cough medication called Mucinex DM: take 2 tablets every 12 hours. and A prescription cough medication called Tessalon Perles 100mg . You may take 1-2 capsules every 8 hours as needed for your cough.  Prednisone 10 mg daily for 6 days (see taper instructions below)  Directions for 6 day taper: Day 1: 2 tablets before breakfast, 1 after both lunch & dinner and 2 at bedtime Day 2: 1 tab before breakfast, 1 after both lunch & dinner and 2 at bedtime Day 3: 1 tab at each meal & 1 at bedtime Day 4: 1 tab at breakfast, 1 at lunch, 1 at bedtime Day 5: 1 tab at breakfast & 1 tab at bedtime Day 6: 1 tab at breakfast  From your responses in the eVisit questionnaire you describe inflammation in the upper respiratory tract which is causing a significant cough.  This is commonly called Bronchitis and has four common causes:   Allergies Viral Infections Acid Reflux Bacterial Infection Allergies, viruses and acid reflux are treated by controlling symptoms or eliminating the cause. An example might be a cough caused by taking certain blood pressure medications. You stop the cough by changing the medication. Another example might be a cough caused by acid reflux. Controlling the reflux helps control the cough.  USE OF BRONCHODILATOR ("RESCUE") INHALERS: There is a risk from using your bronchodilator too frequently.  The risk is that over-reliance on a medication which only relaxes the muscles surrounding the breathing tubes can reduce the effectiveness of medications prescribed to reduce swelling and congestion of the  tubes themselves.  Although you feel brief relief from the bronchodilator inhaler, your asthma may actually be worsening with the tubes becoming more swollen and filled with mucus.  This can delay other crucial treatments, such as oral steroid medications. If you need to use a bronchodilator inhaler daily, several times per day, you should discuss this with your provider.  There are probably better treatments that could be used to keep your asthma under control.     HOME CARE Only take medications as instructed by your medical team. Complete the entire course of an antibiotic. Drink plenty of fluids and get plenty of rest. Avoid close contacts especially the very young and the elderly Cover your mouth if you cough or cough into your sleeve. Always remember to wash your hands A steam or ultrasonic humidifier can help congestion.   GET HELP RIGHT AWAY IF: You develop worsening fever. You become short of breath You cough up blood. Your symptoms persist after you have completed your treatment plan MAKE SURE YOU  Understand these instructions. Will watch your condition. Will get help right away if you are not doing well or get worse.    Thank you for choosing an e-visit.  Your e-visit answers were reviewed by a board certified advanced clinical practitioner to complete your personal care plan. Depending upon the condition, your plan could have included both over the counter or prescription medications.  Please review your pharmacy choice. Make sure the pharmacy is  open so you can pick up prescription now. If there is a problem, you may contact your provider through [employer] and have the prescription routed to another pharmacy.  Your safety is important to [place]. If you have drug allergies check your prescription carefully.   For the next 24 hours you can use MyChart to ask questions about today's visit, request a non-urgent call back, or ask for a work or school excuse. You will get an  email in the next two days asking about your experience. I hope that your e-visit has been valuable and will speed your recovery.  I have spent 5 minutes in review of e-visit questionnaire, review and updating patient chart, medical decision making and response to patient.   Margaretann Loveless, PA-C

## 2024-04-03 ENCOUNTER — Other Ambulatory Visit (HOSPITAL_COMMUNITY): Payer: Self-pay

## 2024-04-05 ENCOUNTER — Encounter: Payer: Self-pay | Admitting: Radiology

## 2024-04-05 ENCOUNTER — Ambulatory Visit: Admitting: Radiology

## 2024-04-05 VITALS — BP 154/84 | HR 74 | Ht 62.0 in | Wt 159.0 lb

## 2024-04-05 DIAGNOSIS — Z01419 Encounter for gynecological examination (general) (routine) without abnormal findings: Secondary | ICD-10-CM | POA: Diagnosis not present

## 2024-04-05 DIAGNOSIS — Z1331 Encounter for screening for depression: Secondary | ICD-10-CM

## 2024-04-05 NOTE — Progress Notes (Signed)
 Tanya Wilkinson 14-Jan-1961 [id number]   History: Postmenopausal 63 y.o. presents for annual exam. No gyn concerns. Lost her mom to leukemia last year.   Gynecologic History Postmenopausal Last Pap: 2023. Results were: normal Last mammogram: 11/24. Results were: normal Last colonoscopy: 2015 DEXA:2023- managed by PCP osteopenia   Obstetric History OB History  Gravida Para Term Preterm AB Living  4 4 4   4   SAB IAB Ectopic Multiple Live Births      4    # Outcome Date GA Lbr Len/2nd Weight Sex Type Anes PTL Lv  4 Term     F CS-Classical   LIV  3 Term     F CS-Classical   LIV  2 Term     M Vag-Spont   LIV  1 Term     M Vag-Spont   LIV       04/05/2024    3:10 PM 12/27/2023   11:00 AM 08/31/2023   12:44 PM  Depression screen PHQ 2/9  Decreased Interest 0 1 0  Down, Depressed, Hopeless 0 1 0  PHQ - 2 Score 0 2 0  Altered sleeping  0 0  Tired, decreased energy  1 0  Change in appetite  0 0  Feeling bad or failure about yourself   0 0  Trouble concentrating  1 0  Moving slowly or fidgety/restless  0 0  Suicidal thoughts  0 0  PHQ-9 Score  4 0  Difficult doing work/chores  Not difficult at all Not difficult at all     The following portions of the patient's history were reviewed and updated as appropriate: allergies, current medications, past family history, past medical history, past social history, past surgical history, and problem list.  Review of Systems Pertinent items noted in HPI and remainder of comprehensive ROS otherwise negative.  Past medical history, past surgical history, family history and social history were all reviewed and documented in the EPIC chart.  Exam:  Vitals:   04/05/24 1509 04/05/24 1514  BP: (!) 164/92 (!) 154/84  Pulse: 74   SpO2: 99%   Weight: 159 lb (72.1 kg)   Height: 5\' 2"  (1.575 m)    Body mass index is 29.08 kg/m.  General appearance:  Normal Thyroid :  Symmetrical, normal in size, without palpable masses or  nodularity. Respiratory  Auscultation:  Clear without wheezing or rhonchi Cardiovascular  Auscultation:  Regular rate, without rubs, murmurs or gallops  Edema/varicosities:  Not grossly evident Abdominal  Soft,nontender, without masses, guarding or rebound.  Liver/spleen:  No organomegaly noted  Hernia:  None appreciated  Skin  Inspection:  Grossly normal Breasts: Examined lying and sitting.   Right: Without masses, retractions, nipple discharge or axillary adenopathy.   Left: Without masses, retractions, nipple discharge or axillary adenopathy. Genitourinary   Inguinal/mons:  Normal without inguinal adenopathy  External genitalia:  Normal appearing vulva with no masses, tenderness, or lesions  BUS/Urethra/Skene's glands:  Normal  Vagina:  Normal appearing with normal color and discharge, no lesions. Atrophy: moderate   Cervix:  Normal appearing without discharge or lesions  Uterus:  Normal in size, shape and contour.  Midline and mobile, nontender  Adnexa/parametria:     Rt: Normal in size, without masses or tenderness.   Lt: Normal in size, without masses or tenderness.  Anus and perineum: Normal    Ellis Guys, CMA present for exam  Assessment/Plan:   1. Well woman exam with routine gynecological exam (Primary) Pap 2026 Yearly mammo DEXA  due this year    Discussed SBE, colonoscopy and DEXA screening as directed. Return in 1 year for annual or sooner prn.  Najma Bozarth B WHNP-BC, 3:16 PM 04/05/2024

## 2024-04-06 ENCOUNTER — Other Ambulatory Visit: Payer: Self-pay | Admitting: Radiology

## 2024-04-06 DIAGNOSIS — Z1211 Encounter for screening for malignant neoplasm of colon: Secondary | ICD-10-CM

## 2024-04-06 DIAGNOSIS — Z780 Asymptomatic menopausal state: Secondary | ICD-10-CM

## 2024-04-12 NOTE — Telephone Encounter (Signed)
 GI will discuss at her consult visit

## 2024-04-18 DIAGNOSIS — K219 Gastro-esophageal reflux disease without esophagitis: Secondary | ICD-10-CM

## 2024-05-03 ENCOUNTER — Other Ambulatory Visit: Payer: Self-pay

## 2024-05-03 ENCOUNTER — Other Ambulatory Visit (HOSPITAL_COMMUNITY): Payer: Self-pay

## 2024-05-03 MED ORDER — PANTOPRAZOLE SODIUM 20 MG PO TBEC
20.0000 mg | DELAYED_RELEASE_TABLET | Freq: Two times a day (BID) | ORAL | 2 refills | Status: DC
  Filled 2024-05-03: qty 60, 30d supply, fill #0

## 2024-06-07 ENCOUNTER — Encounter: Payer: Self-pay | Admitting: Nurse Practitioner

## 2024-06-07 NOTE — Telephone Encounter (Signed)
 Erroneous encounter

## 2024-06-22 ENCOUNTER — Ambulatory Visit: Payer: Commercial Managed Care - PPO | Admitting: Nurse Practitioner

## 2024-06-22 ENCOUNTER — Encounter: Payer: Self-pay | Admitting: Nurse Practitioner

## 2024-06-22 ENCOUNTER — Other Ambulatory Visit (HOSPITAL_COMMUNITY): Payer: Self-pay

## 2024-06-22 ENCOUNTER — Other Ambulatory Visit: Payer: Self-pay

## 2024-06-22 ENCOUNTER — Ambulatory Visit: Admitting: Nurse Practitioner

## 2024-06-22 VITALS — BP 131/73 | HR 74 | Temp 98.1°F | Ht 62.0 in | Wt 159.0 lb

## 2024-06-22 DIAGNOSIS — Z0000 Encounter for general adult medical examination without abnormal findings: Secondary | ICD-10-CM

## 2024-06-22 DIAGNOSIS — M8580 Other specified disorders of bone density and structure, unspecified site: Secondary | ICD-10-CM

## 2024-06-22 DIAGNOSIS — E785 Hyperlipidemia, unspecified: Secondary | ICD-10-CM

## 2024-06-22 DIAGNOSIS — K219 Gastro-esophageal reflux disease without esophagitis: Secondary | ICD-10-CM

## 2024-06-22 DIAGNOSIS — Z6828 Body mass index (BMI) 28.0-28.9, adult: Secondary | ICD-10-CM | POA: Diagnosis not present

## 2024-06-22 DIAGNOSIS — Z0001 Encounter for general adult medical examination with abnormal findings: Secondary | ICD-10-CM | POA: Diagnosis not present

## 2024-06-22 DIAGNOSIS — E8881 Metabolic syndrome: Secondary | ICD-10-CM | POA: Diagnosis not present

## 2024-06-22 DIAGNOSIS — I10 Essential (primary) hypertension: Secondary | ICD-10-CM

## 2024-06-22 DIAGNOSIS — R7989 Other specified abnormal findings of blood chemistry: Secondary | ICD-10-CM

## 2024-06-22 LAB — BAYER DCA HB A1C WAIVED: HB A1C (BAYER DCA - WAIVED): 5.6 % (ref 4.8–5.6)

## 2024-06-22 LAB — LIPID PANEL

## 2024-06-22 MED ORDER — ATORVASTATIN CALCIUM 40 MG PO TABS
40.0000 mg | ORAL_TABLET | Freq: Every day | ORAL | 1 refills | Status: DC
  Filled 2024-06-22: qty 90, 90d supply, fill #0
  Filled 2024-09-14 (×2): qty 90, 90d supply, fill #1

## 2024-06-22 MED ORDER — HYDROCHLOROTHIAZIDE 25 MG PO TABS
25.0000 mg | ORAL_TABLET | Freq: Every day | ORAL | 1 refills | Status: DC
  Filled 2024-06-22: qty 90, 90d supply, fill #0
  Filled 2024-09-14 (×2): qty 90, 90d supply, fill #1

## 2024-06-22 MED ORDER — PANTOPRAZOLE SODIUM 20 MG PO TBEC
20.0000 mg | DELAYED_RELEASE_TABLET | Freq: Two times a day (BID) | ORAL | 2 refills | Status: DC
Start: 2024-06-22 — End: 2024-07-26
  Filled 2024-06-22: qty 60, 30d supply, fill #0

## 2024-06-22 NOTE — Patient Instructions (Signed)
 Bone Health Bones protect organs, store calcium, anchor muscles, and support the whole body. Keeping your bones strong is important, especially as you get older. You can take actions to help keep your bones strong and healthy. Why is keeping my bones healthy important?  Keeping your bones healthy is important because your body constantly replaces bone cells. Cells get old, and new cells take their place. As we age, we lose bone cells because the body may not be able to make enough new cells to replace the old cells. The amount of bone cells and bone tissue you have is referred to as bone mass. The higher your bone mass, the stronger your bones. The aging process leads to an overall loss of bone mass in the body, which can increase the likelihood of: Broken bones. A condition in which the bones become weak and brittle (osteoporosis). A large decline in bone mass occurs in older adults. In women, it occurs about the time of menopause. What actions can I take to keep my bones healthy? Good health habits are important for maintaining healthy bones. This includes eating nutritious foods and exercising regularly. To have healthy bones, you need to get enough of the right minerals and vitamins. Most nutrition experts recommend getting these nutrients from the foods that you eat. In some cases, taking supplements may also be recommended. Doing certain types of exercise is also important for bone health. What are the nutritional recommendations for healthy bones?  Eating a well-balanced diet with plenty of calcium and vitamin D will help to protect your bones. Nutritional recommendations vary from person to person. Ask your health care provider what is healthy for you. Here are some general guidelines. Get enough calcium Calcium is the most important (essential) mineral for bone health. Most people can get enough calcium from their diet, but supplements may be recommended for people who are at risk for  osteoporosis. Good sources of calcium include: Dairy products, such as low-fat or nonfat milk, cheese, and yogurt. Dark green leafy vegetables, such as bok choy and broccoli. Foods that have calcium added to them (are fortified). Foods that may be fortified with calcium include orange juice, cereal, bread, soy beverages, and tofu products. Nuts, such as almonds. Follow these recommended amounts for daily calcium intake: Infants, 0-6 months: 200 mg. Infants, 6-12 months: 260 mg. Children, age 647-3: 700 mg. Children, age 64-8: 1,000 mg. Children, age 642-13: 1,300 mg. Teens, age 38-18: 1,300 mg. Adults, age 65-50: 1,000 mg. Adults, age 23-70: Men: 1,000 mg. Women: 1,200 mg. Adults, age 97 or older: 1,200 mg. Pregnant and breastfeeding females: Teens: 1,300 mg. Adults: 1,000 mg. Get enough vitamin D Vitamin D is the most essential vitamin for bone health. It helps the body absorb calcium. Sunlight stimulates the skin to make vitamin D, so be sure to get enough sunlight. If you live in a cold climate or you do not get outside often, your health care provider may recommend that you take vitamin D supplements. Good sources of vitamin D in your diet include: Egg yolks. Saltwater fish. Milk and cereal fortified with vitamin D. Follow these recommended amounts for daily vitamin D intake: Infants, 0-12 months: 400 international units (IU). Children and teens, age 647-18: 600 international units. Adults, age 31 or younger: 600 international units. Adults, age 9 or older: 600-1,000 international units. Get other important nutrients Other nutrients that are important for bone health include: Phosphorus. This mineral is found in meat, poultry, dairy foods, nuts, and legumes. The  recommended daily intake for adult men and adult women is 700 mg. Magnesium. This mineral is found in seeds, nuts, dark green vegetables, and legumes. The recommended daily intake for adult men is 400-420 mg. For adult women,  it is 310-320 mg. Vitamin K. This vitamin is found in green leafy vegetables. The recommended daily intake is 120 mcg for adult men and 90 mcg for adult women. What type of physical activity is best for building and maintaining healthy bones? Weight-bearing and strength-building activities are important for building and maintaining healthy bones. Weight-bearing activities cause muscles and bones to work against gravity. Strength-building activities increase the strength of the muscles that support bones. Weight-bearing and muscle-building activities include: Walking and hiking. Jogging and running. Dancing. Gym exercises. Lifting weights. Tennis and racquetball. Climbing stairs. Aerobics. Adults should get at least 30 minutes of moderate physical activity on most days. Children should get at least 60 minutes of moderate physical activity on most days. Ask your health care provider what type of exercise is best for you. How can I find out if my bone mass is low? Bone mass can be measured with an X-ray test called a bone mineral density (BMD) test. This test is recommended for all women who are age 19 or older. It may also be recommended for: Men who are age 55 or older. People who are at risk for osteoporosis because of: Having a long-term disease that weakens bones, such as kidney disease or rheumatoid arthritis. Having menopause earlier than normal. Taking medicine that weakens bones, such as steroids, thyroid hormones, or hormone treatment for breast cancer or prostate cancer. Smoking. Drinking three or more alcoholic drinks a day. Being underweight. Sedentary lifestyle. If you find that you have a low bone mass, you may be able to prevent osteoporosis or further bone loss by changing your diet and lifestyle. Where can I find more information? Bone Health & Osteoporosis Foundation: https://carlson-fletcher.info/ [employer]: www.bones.http://www.myers.net/ International Osteoporosis  Foundation: Investment banker, operational.iofbonehealth.org Summary The aging process leads to an overall loss of bone mass in the body, which can increase the likelihood of broken bones and osteoporosis. Eating a well-balanced diet with plenty of calcium and vitamin D will help to protect your bones. Weight-bearing and strength-building activities are also important for building and maintaining strong bones. Bone mass can be measured with an X-ray test called a bone mineral density (BMD) test. This information is not intended to replace advice given to you by your health care provider. Make sure you discuss any questions you have with your health care provider. Document Revised: 05/06/2021 Document Reviewed: 05/06/2021 Elsevier Patient Education  2024 [employer].

## 2024-06-22 NOTE — Progress Notes (Signed)
 Subjective:    Patient ID: Tanya Wilkinson, female    DOB: 04-Jun-1961, 63 y.o.   MRN: [id number]   Chief Complaint: annual physical    HPI:  Tanya Wilkinson is a 63 y.o. who identifies as a female who was assigned female at birth.   Social history: Lives with: husband Work history: works for [employer] in today for follow up of the following chronic medical issues:  1. Essential hypertension No c/o chest  pain, sob or headache. Does not check blood pressure at home. BP Readings from Last 3 Encounters:  04/05/24 (!) 154/84  12/27/23 (!) 149/83  08/31/23 (!) 144/81     2. Hyperlipidemia with target LDL less than 100 Does not really watch diet and does no dedicated exercise.is on fenofibrate  but wiuld rather just take omega 3 OTC. Lab Results  Component Value Date   CHOL 166 12/27/2023   HDL 47 12/27/2023   LDLCALC 95 12/27/2023   TRIG 135 12/27/2023   CHOLHDL 3.5 12/27/2023     3. Metabolic syndrome She does not check her blood sugars at home. Lab Results  Component Value Date   HGBA1C 5.7 (H) 12/27/2023     4. Low serum vitamin D  Is on daily vitamin d  supplement  5. Osteopenia, unspecified location Last dexascan was done on 03/09/22. T score -2.0.  6. BMI 28.0-28.9,adult Weight is unchanged   Wt Readings from Last 3 Encounters:  06/22/24 159 lb (72.1 kg)  04/05/24 159 lb (72.1 kg)  12/27/23 164 lb (74.4 kg)   BMI Readings from Last 3 Encounters:  06/22/24 29.08 kg/m  04/05/24 29.08 kg/m  12/27/23 30.00 kg/m        New complaints: None today   Allergies  Allergen Reactions   Morphine And Codeine Itching   Outpatient Encounter Medications as of 06/22/2024  Medication Sig   ALPRAZolam  (XANAX ) 0.25 MG tablet Take 1 tablet (0.25 mg total) by mouth 2 (two) times daily as needed for anxiety.   atorvastatin  (LIPITOR) 40 MG tablet Take 1 tablet (40 mg total) by mouth daily.   benzonatate  (TESSALON ) 100 MG capsule Take 1-2 capsules  (100-200 mg total) by mouth 3 (three) times daily as needed. (Patient not taking: Reported on 04/05/2024)   Blood Glucose Monitoring Suppl (BLOOD GLUCOSE MONITOR SYSTEM) w/Device KIT Use in the morning, at noon, and at bedtime.   CALCIUM  PO Take by mouth.   Cholecalciferol (VITAMIN D ) 2000 units CAPS Take by mouth.   ELDERBERRY PO Take by mouth. + zinc & vitamin c (Patient not taking: Reported on 04/05/2024)   fenofibrate  (TRICOR ) 145 MG tablet Take 1 tablet (145 mg total) by mouth daily.   fluticasone  (FLONASE ) 50 MCG/ACT nasal spray Place 2 sprays into both nostrils daily. (Patient not taking: Reported on 04/05/2024)   hydrochlorothiazide  (HYDRODIURIL ) 25 MG tablet Take 1 tablet (25 mg total) by mouth daily.   Multiple Vitamins-Minerals (CENTRUM SILVER PO) Take 1 tablet by mouth daily.   pantoprazole  (PROTONIX ) 20 MG tablet Take 1 tablet (20 mg total) by mouth 2 (two) times daily.   predniSONE  (STERAPRED UNI-PAK 21 TAB) 10 MG (21) TBPK tablet 6 day taper; take as directed on package instructions (Patient not taking: Reported on 04/05/2024)   No facility-administered encounter medications on file as of 06/22/2024.    Past Surgical History:  Procedure Laterality Date   ANTERIOR CERVICAL DECOMP/DISCECTOMY FUSION  08/06/2006   CATARACT EXTRACTION Right 08/25/2023   CESAREAN SECTION  8015&[id number]  COLONOSCOPY  12/06/2000   [facility]ospital [facility]    TUBAL LIGATION  8014,8013   BTL    Family History  Problem Relation Age of Onset   Diabetes Mother    Hypertension Mother    Kidney disease Mother    Leukemia Mother    Cancer Father        throat & lung-smoker   Stroke Father    Stroke Brother    Hypertension Brother    Cancer Brother 22       bladder   Stroke Brother    Early death Brother 9   Stroke Son    Hypertension Son    Colon cancer Neg Hx    Pancreatic cancer Neg Hx    Stomach cancer Neg Hx    Esophageal cancer Neg Hx    Breast cancer Neg Hx       Controlled substance contract:  n/a     Review of Systems  Constitutional:  Negative for diaphoresis.  Eyes:  Negative for pain.  Respiratory:  Negative for shortness of breath.   Cardiovascular:  Negative for chest pain, palpitations and leg swelling.  Gastrointestinal:  Negative for abdominal pain.  Endocrine: Negative for polydipsia.  Skin:  Negative for rash.  Neurological:  Negative for dizziness, weakness and headaches.  Hematological:  Does not bruise/bleed easily.  All other systems reviewed and are negative.      Objective:   Physical Exam Vitals and nursing note reviewed.  Constitutional:      General: She is not in acute distress.    Appearance: Normal appearance. She is well-developed.  HENT:     Head: Normocephalic.     Right Ear: Tympanic membrane normal.     Left Ear: Tympanic membrane normal.     Nose: Nose normal.     Mouth/Throat:     Mouth: Mucous membranes are moist.  Eyes:     Pupils: Pupils are equal, round, and reactive to light.  Neck:     Vascular: No carotid bruit or JVD.  Cardiovascular:     Rate and Rhythm: Normal rate and regular rhythm.     Heart sounds: Normal heart sounds.  Pulmonary:     Effort: Pulmonary effort is normal. No respiratory distress.     Breath sounds: Normal breath sounds. No wheezing or rales.  Chest:     Chest wall: No tenderness.  Abdominal:     General: Bowel sounds are normal. There is no distension or abdominal bruit.     Palpations: Abdomen is soft. There is no hepatomegaly, splenomegaly, mass or pulsatile mass.     Tenderness: There is no abdominal tenderness.  Musculoskeletal:        General: Normal range of motion.     Cervical back: Normal range of motion and neck supple.  Lymphadenopathy:     Cervical: No cervical adenopathy.  Skin:    General: Skin is warm and dry.     Comments: 1cm scaley facial lesion  Neurological:     Mental Status: She is alert and oriented to person, place, and time.     Deep Tendon Reflexes: Reflexes are  normal and symmetric.  Psychiatric:        Behavior: Behavior normal.        Thought Content: Thought content normal.        Judgment: Judgment normal.     BP 131/73   Pulse 74   Temp 98.1 F (36.7 C) (Temporal)   Ht 5' 2 (  1.575 m)   Wt 159 lb (72.1 kg)   LMP 12/07/2007   SpO2 97%   BMI 29.08 kg/m   LMP 12/07/2007   Hgba1c 5.6%    Assessment & Plan:  Tanya Wilkinson comes in today with chief complaint of annual physical  Diagnosis and orders addressed:  1. Essential hypertension Low sodium diet - hydrochlorothiazide  (HYDRODIURIL ) 25 MG tablet; Take 1 tablet (25 mg total) by mouth daily.  Dispense: 90 tablet; Refill: 1 - CBC with Differential/Platelet - CMP14+EGFR  2. Hyperlipidemia with target LDL less than 100 Low fat diet Omega 3 OTC - atorvastatin  (LIPITOR) 40 MG tablet; Take 1 tablet (40 mg total) by mouth daily.  Dispense: 90 tablet; Refill: 1 - Lipid panel  3. Metabolic syndrome Low carb diet  4. Low serum vitamin D  Continue vitamin d  supplement  5. Osteopenia, unspecified location Weight bearing exercise  6. BMI 28.0-28.9,adult Discussed diet and exercise for person with BMI >25 Will recheck weight in 3-6 months   7. Facial lesion Do not pick or scratch at area - Ambulatory referral to Dermatology   Labs pending Health Maintenance reviewed Diet and exercise encouraged  Follow up plan: 6 months   Mary-Margaret Gladis, FNP

## 2024-06-23 LAB — CBC WITH DIFFERENTIAL/PLATELET
Basophils Absolute: 0 x10E3/uL (ref 0.0–0.2)
Basos: 1 %
EOS (ABSOLUTE): 0.1 x10E3/uL (ref 0.0–0.4)
Eos: 2 %
Hematocrit: 37.1 % (ref 34.0–46.6)
Hemoglobin: 12.3 g/dL (ref 11.1–15.9)
Immature Grans (Abs): 0 x10E3/uL (ref 0.0–0.1)
Immature Granulocytes: 0 %
Lymphocytes Absolute: 1.8 x10E3/uL (ref 0.7–3.1)
Lymphs: 36 %
MCH: 30.9 pg (ref 26.6–33.0)
MCHC: 33.2 g/dL (ref 31.5–35.7)
MCV: 93 fL (ref 79–97)
Monocytes Absolute: 0.5 x10E3/uL (ref 0.1–0.9)
Monocytes: 10 %
Neutrophils Absolute: 2.6 x10E3/uL (ref 1.4–7.0)
Neutrophils: 51 %
Platelets: 258 x10E3/uL (ref 150–450)
RBC: 3.98 x10E6/uL (ref 3.77–5.28)
RDW: 11.8 % (ref 11.7–15.4)
WBC: 5.1 x10E3/uL (ref 3.4–10.8)

## 2024-06-23 LAB — LIPID PANEL
Cholesterol, Total: 180 mg/dL (ref 100–199)
HDL: 52 mg/dL (ref >39)
LDL CALC COMMENT:: 3.5 ratio (ref 0.0–4.4)
LDL Chol Calc (NIH): 102 mg/dL — AB (ref 0–99)
Triglycerides: 148 mg/dL (ref 0–149)
VLDL Cholesterol Cal: 26 mg/dL (ref 5–40)

## 2024-06-23 LAB — CMP14+EGFR
ALT: 18 IU/L (ref 0–32)
AST: 19 IU/L (ref 0–40)
Albumin: 4.4 g/dL (ref 3.9–4.9)
Alkaline Phosphatase: 110 IU/L (ref 44–121)
BUN/Creatinine Ratio: 11 — AB (ref 12–28)
BUN: 8 mg/dL (ref 8–27)
Bilirubin Total: 0.5 mg/dL (ref 0.0–1.2)
CO2: 27 mmol/L (ref 20–29)
Calcium: 9.8 mg/dL (ref 8.7–10.3)
Chloride: 98 mmol/L (ref 96–106)
Creatinine, Ser: 0.71 mg/dL (ref 0.57–1.00)
Globulin, Total: 2.3 g/dL (ref 1.5–4.5)
Glucose: 101 mg/dL — AB (ref 70–99)
Potassium: 4.5 mmol/L (ref 3.5–5.2)
Sodium: 140 mmol/L (ref 134–144)
Total Protein: 6.7 g/dL (ref 6.0–8.5)
eGFR: 95 mL/min/1.73 (ref >59)

## 2024-07-02 ENCOUNTER — Ambulatory Visit: Payer: Self-pay | Admitting: Nurse Practitioner

## 2024-07-05 ENCOUNTER — Other Ambulatory Visit (INDEPENDENT_AMBULATORY_CARE_PROVIDER_SITE_OTHER)

## 2024-07-05 DIAGNOSIS — M8580 Other specified disorders of bone density and structure, unspecified site: Secondary | ICD-10-CM | POA: Diagnosis not present

## 2024-07-05 DIAGNOSIS — Z780 Asymptomatic menopausal state: Secondary | ICD-10-CM | POA: Diagnosis not present

## 2024-07-13 ENCOUNTER — Ambulatory Visit: Payer: Self-pay | Admitting: Radiology

## 2024-07-26 ENCOUNTER — Other Ambulatory Visit: Payer: Self-pay

## 2024-07-26 ENCOUNTER — Encounter: Payer: Self-pay | Admitting: Nurse Practitioner

## 2024-07-26 ENCOUNTER — Ambulatory Visit: Admitting: Nurse Practitioner

## 2024-07-26 VITALS — BP 150/82 | HR 78 | Ht 62.0 in | Wt 162.0 lb

## 2024-07-26 DIAGNOSIS — Z860100 Personal history of colon polyps, unspecified: Secondary | ICD-10-CM

## 2024-07-26 DIAGNOSIS — Z860102 Personal history of hyperplastic colon polyps: Secondary | ICD-10-CM

## 2024-07-26 DIAGNOSIS — R09A2 Foreign body sensation, throat: Secondary | ICD-10-CM

## 2024-07-26 DIAGNOSIS — R053 Chronic cough: Secondary | ICD-10-CM

## 2024-07-26 DIAGNOSIS — K219 Gastro-esophageal reflux disease without esophagitis: Secondary | ICD-10-CM

## 2024-07-26 DIAGNOSIS — R070 Pain in throat: Secondary | ICD-10-CM

## 2024-07-26 MED ORDER — NA SULFATE-K SULFATE-MG SULF 17.5-3.13-1.6 GM/177ML PO SOLN
1.0000 | ORAL | 0 refills | Status: DC
  Filled 2024-07-26: qty 354, 2d supply, fill #0

## 2024-07-26 MED ORDER — PANTOPRAZOLE SODIUM 40 MG PO TBEC
40.0000 mg | DELAYED_RELEASE_TABLET | Freq: Every day | ORAL | 3 refills | Status: DC
  Filled 2024-07-26: qty 30, 30d supply, fill #0
  Filled 2024-09-14 (×2): qty 30, 30d supply, fill #1

## 2024-07-26 NOTE — Patient Instructions (Signed)
 We have sent the following medications to your pharmacy for you to pick up at your convenience: Pantoprazole , Suprep   You have been scheduled for an endoscopy and colonoscopy. Please follow the written instructions given to you at your visit today.  If you use inhalers (even only as needed), please bring them with you on the day of your procedure.  DO NOT TAKE 7 DAYS PRIOR TO TEST- Trulicity (dulaglutide) Ozempic, Wegovy (semaglutide) Mounjaro (tirzepatide) Bydureon Bcise (exanatide extended release)  DO NOT TAKE 1 DAY PRIOR TO YOUR TEST Rybelsus (semaglutide) Adlyxin (lixisenatide) Victoza (liraglutide) Byetta (exanatide) ___________________________________________________________________________  We are referring you to ENT.  They will contact you directly to schedule an appointment.  It may take a week or more before you hear from them.  Please feel free to contact us  if you have not heard from them within 2 weeks and we will follow up on the referral.   Due to recent changes in healthcare laws, you may see the results of your imaging and laboratory studies on MyChart before your provider has had a chance to review them.  We understand that in some cases there may be results that are confusing or concerning to you. Not all laboratory results come back in the same time frame and the provider may be waiting for multiple results in order to interpret others.  Please give us  48 hours in order for your provider to thoroughly review all the results before contacting the office for clarification of your results.   It was a pleasure to see you today!  [employer] CRNP

## 2024-07-26 NOTE — Progress Notes (Addendum)
 07/26/2024 Tanya Wilkinson [id number] 11/15/1961   CHIEF COMPLAINT: Worsening acid reflux  HISTORY OF PRESENT ILLNESS: Tanya Wilkinson is a 63 year old female with a past medical history of hypertension, hyperlipidemia, DM type II, osteopenia, basal cell skin cancer, vitamin D  deficiency and a hyperplastic colon polyp. She is known by Dr. Albertus.   She presents to our office today as referred by Mary-Margaret Gladis FNP for further evaluation regarding suspected GERD and referred by Shasta KATHEE Bland NP to schedule a screening colonoscopy. She endorses having a cough, sometimes feels like something is in the left side of her throat. The sensation in her throat sometimes triggers episodes of sneeze sneezing and gagging which typically occurs at work when she is sitting down and never happens at night which is intermittent and initially started approximately 1 year ago. She denies having any classic heartburn. No burping or dysphagia. She previously took Pantoprazole  20 mg once daily which was increased to twice daily a few months ago and for the past week she is taking Pantoprazole  40 mg once daily and her cough and throat discomfort have slightly decreased. She is passing normal formed bowel movement daily, stools are little softer since starting omega-3. No bloody or black stools. No nausea or vomiting. No abdominal pain. She underwent a screening colonoscopy 09/2014 which identified 1 hyperplastic polyp removed from the sigmoid colon. She was advised to repeat a colonoscopy in 10 years.  No known family history of colon polyps or colorectal cancer.  Infrequent NSAID use.     Latest Ref Rng & Units 06/22/2024    8:05 AM 12/27/2023   11:29 AM 05/27/2023    9:48 AM  CBC  WBC 3.4 - 10.8 x10E3/uL 5.1  4.8  4.9   Hemoglobin 11.1 - 15.9 g/dL 87.6  87.9  87.1   Hematocrit 34.0 - 46.6 % 37.1  36.6  39.2   Platelets 150 - 450 x10E3/uL 258  256  283        Latest Ref Rng & Units 06/22/2024    8:05 AM  12/27/2023   11:29 AM 05/27/2023    9:48 AM  CMP  Glucose 70 - 99 mg/dL 898  99  891   BUN 8 - 27 mg/dL 8  9  6    Creatinine 0.57 - 1.00 mg/dL 9.28  9.34  9.25   Sodium 134 - 144 mmol/L 140  143  139   Potassium 3.5 - 5.2 mmol/L 4.5  4.6  3.8   Chloride 96 - 106 mmol/L 98  103  96   CO2 20 - 29 mmol/L 27  26  26    Calcium  8.7 - 10.3 mg/dL 9.8  9.6  89.9   Total Protein 6.0 - 8.5 g/dL 6.7  6.4  6.8   Total Bilirubin 0.0 - 1.2 mg/dL 0.5  0.4  0.5   Alkaline Phos 44 - 121 IU/L 110  115  114   AST 0 - 40 IU/L 19  18  18    ALT 0 - 32 IU/L 18  13  14    Hemoglobin A1c 5.6  GI PROCEDURES:  Colonoscopy 09/18/2014 by Dr. Albertus: 5mm sessile polyp removed from the sigmoid colon Surgical [P], rectosigmoid, polyp Recall colonoscopy 10 years  - HYPERPLASTIC POLYP(S). - THERE IS NO EVIDENCE OF MALIGNANCY  Past Medical History:  Diagnosis Date   Herniated disc    Hyperlipidemia    Hypertension    Osteopenia    Prediabetes  Vitamin D  deficiency    Past Surgical History:  Procedure Laterality Date   ANTERIOR CERVICAL DECOMP/DISCECTOMY FUSION  08/06/2006   CATARACT EXTRACTION Right 08/25/2023   CESAREAN SECTION  8015&8013   COLONOSCOPY  12/06/2000   [facility] in [place]    TUBAL LIGATION  8014,8013   BTL   Social History: She is married.  She works for [employer].  She has 2 sons and 2 daughters.  Past smoker, quit smoking cigarettes 29 years ago.  No alcohol use.  No drug use.  Family History: Mother had leukemia and diabetes.  Father with history of a stroke and had throat and lung cancer.  Brother had a stroke.  Allergies  Allergen Reactions   Morphine And Codeine Itching      Outpatient Encounter Medications as of 07/26/2024  Medication Sig   ALPRAZolam  (XANAX ) 0.25 MG tablet Take 1 tablet (0.25 mg total) by mouth 2 (two) times daily as needed for anxiety.   atorvastatin  (LIPITOR) 40 MG tablet Take 1 tablet (40 mg total) by mouth daily.   Blood  Glucose Monitoring Suppl (BLOOD GLUCOSE MONITOR SYSTEM) w/Device KIT Use in the morning, at noon, and at bedtime.   CALCIUM  PO Take by mouth.   Cholecalciferol (VITAMIN D ) 2000 units CAPS Take by mouth.   ELDERBERRY PO Take by mouth. + zinc & vitamin c (Patient not taking: Reported on 06/22/2024)   fenofibrate  (TRICOR ) 145 MG tablet Take 1 tablet (145 mg total) by mouth daily.   fluticasone  (FLONASE ) 50 MCG/ACT nasal spray Place 2 sprays into both nostrils daily. (Patient not taking: Reported on 06/22/2024)   hydrochlorothiazide  (HYDRODIURIL ) 25 MG tablet Take 1 tablet (25 mg total) by mouth daily.   Multiple Vitamins-Minerals (CENTRUM SILVER PO) Take 1 tablet by mouth daily.   pantoprazole  (PROTONIX ) 20 MG tablet Take 1 tablet (20 mg total) by mouth 2 (two) times daily.   predniSONE  (STERAPRED UNI-PAK 21 TAB) 10 MG (21) TBPK tablet 6 day taper; take as directed on package instructions (Patient not taking: Reported on 04/05/2024)   No facility-administered encounter medications on file as of 07/26/2024.   REVIEW OF SYSTEMS:  Gen: Denies fever, sweats or chills. No weight loss.  CV: Denies chest pain, palpitations or edema. Resp: Denies cough, shortness of breath of hemoptysis.  GI: See HPI. GU: Denies urinary burning, blood in urine, increased urinary frequency or incontinence. MS: Denies joint pain, muscles aches or weakness. Derm: Denies rash, itchiness, skin lesions or unhealing ulcers. Psych: Denies depression, anxiety, memory loss or confusion. Heme: Denies bruising, easy bleeding. Neuro:  Denies headaches, dizziness or paresthesias. Endo:  Denies any problems with DM, thyroid  or adrenal function.  PHYSICAL EXAM: LMP 12/07/2007  BP (!) 150/82 (BP Location: Left Arm, Patient Position: Sitting, Cuff Size: Normal)   Pulse 78   Ht 5' 2 (1.575 m)   Wt 162 lb (73.5 kg)   LMP 12/07/2007   BMI 29.63 kg/m   General: 63 year old female in no acute distress. Head: Normocephalic and  atraumatic. Eyes:  Sclerae non-icteric, conjunctive pink. Ears: Normal auditory acuity. Mouth: Dentition intact. No ulcers or lesions.  Neck: Supple, no lymphadenopathy or thyromegaly.  Lungs: Clear bilaterally to auscultation without wheezes, crackles or rhonchi. Heart: Regular rate and rhythm.  Very subtle brief murmur initially auscultated then faded without recurrence.  No rub or gallop appreciated.  Abdomen: Soft, nontender, nondistended. No masses. No hepatosplenomegaly. Normoactive bowel sounds x 4 quadrants.  Rectal: Deferred.  Musculoskeletal: Symmetrical with no  gross deformities. Skin: Warm and dry. No rash or lesions on visible extremities. Extremities: No edema. Neurological: Alert oriented x 4, no focal deficits.  Psychological: Alert and cooperative. Normal mood and affect.  ASSESSMENT AND PLAN:  63 year old female with a chronic cough and left throat lump sensation without classic heartburn symptoms, query laryngeal reflux. - Continue Pantoprazole  40 mg daily, consider increasing to twice daily if symptoms persist or worsen - ENT consult to include laryngoscopy - EGD benefits and risks discussed including risk with sedation, risk of bleeding, perforation and infection  - Consider barium swallow study with tablet if the above evaluations unrevealing  History of a hyperplastic colon polyp per colonoscopy 09/2014. - Colonoscopy benefits and risks discussed including risk with sedation, risk of bleeding, perforation and infection           CC:  Gladis, Mary-Margaret, *

## 2024-09-04 ENCOUNTER — Ambulatory Visit (HOSPITAL_COMMUNITY)

## 2024-09-04 ENCOUNTER — Ambulatory Visit: Payer: Self-pay | Admitting: *Deleted

## 2024-09-04 NOTE — Telephone Encounter (Signed)
 Copied from CRM #[id number]. Topic: Clinical - Red Word Triage >> Sep 04, 2024  3:27 PM Rachelle R wrote: Red Word that prompted transfer to Nurse Triage: Patient is having constant pain in her back, between her shoulder blades. Also has pain in her left arm but it comes and goes. Reason for Disposition  [1] Age > 50 AND [2] no history of prior similar back pain  Answer Assessment - Initial Assessment Questions 1. ONSET: When did the pain begin? (e.g., minutes, hours, days)     yesterday 2. LOCATION: Where does it hurt? (upper, mid or lower back)     upper back midline-  and pain into upper left arm 3. SEVERITY: How bad is the pain?  (e.g., Scale 1-10; mild, moderate, or severe)     8/10 4. PATTERN: Is the pain constant? (e.g., yes, no; constant, intermittent)      Constant- moving arms to work makes it worse 5. RADIATION: Does the pain shoot into your legs or somewhere else?     *No Answer* 6. CAUSE:  What do you think is causing the back pain?      Unsure- OTC is helping some 7. BACK OVERUSE:  Any recent lifting of heavy objects, strenuous work or exercise?     No 8. MEDICINES: What have you taken so far for the pain? (e.g., nothing, acetaminophen, NSAIDS)     Tylenol, ibuprofen , muscle cream, pain patch 9. NEUROLOGIC SYMPTOMS: Do you have any weakness, numbness, or problems with bowel/bladder control?     no 10. OTHER SYMPTOMS: Do you have any other symptoms? (e.g., fever, abdomen pain, burning with urination, blood in urine)       Pain left arm- upper arm- back of arm  Protocols used: Back Pain-A-AH

## 2024-09-04 NOTE — Telephone Encounter (Signed)
 Apt scheduled.

## 2024-09-05 ENCOUNTER — Ambulatory Visit: Admitting: Family Medicine

## 2024-09-05 ENCOUNTER — Ambulatory Visit (INDEPENDENT_AMBULATORY_CARE_PROVIDER_SITE_OTHER)

## 2024-09-05 ENCOUNTER — Encounter: Payer: Self-pay | Admitting: Family Medicine

## 2024-09-05 ENCOUNTER — Ambulatory Visit: Payer: Self-pay | Admitting: Family Medicine

## 2024-09-05 VITALS — BP 154/80 | HR 75 | Temp 98.0°F | Ht 62.0 in | Wt 162.2 lb

## 2024-09-05 DIAGNOSIS — M5412 Radiculopathy, cervical region: Secondary | ICD-10-CM

## 2024-09-05 DIAGNOSIS — Z9889 Other specified postprocedural states: Secondary | ICD-10-CM

## 2024-09-05 DIAGNOSIS — R030 Elevated blood-pressure reading, without diagnosis of hypertension: Secondary | ICD-10-CM

## 2024-09-05 DIAGNOSIS — M4722 Other spondylosis with radiculopathy, cervical region: Secondary | ICD-10-CM | POA: Diagnosis not present

## 2024-09-05 DIAGNOSIS — Z981 Arthrodesis status: Secondary | ICD-10-CM | POA: Diagnosis not present

## 2024-09-05 DIAGNOSIS — M4802 Spinal stenosis, cervical region: Secondary | ICD-10-CM | POA: Diagnosis not present

## 2024-09-05 DIAGNOSIS — M5010 Cervical disc disorder with radiculopathy, unspecified cervical region: Secondary | ICD-10-CM | POA: Diagnosis not present

## 2024-09-05 MED ORDER — TIZANIDINE HCL 4 MG PO TABS: 2.0000 mg | ORAL_TABLET | Freq: Every evening | ORAL | 0 refills | Status: DC | PRN

## 2024-09-05 MED ORDER — PREDNISONE 10 MG (21) PO TBPK: ORAL_TABLET | ORAL | 0 refills | Status: DC

## 2024-09-05 NOTE — Patient Instructions (Signed)
 Blood Pressure Record Sheet To take your blood pressure, you will need a blood pressure machine. You may be prescribed one, or you can buy a blood pressure machine (blood pressure monitor) at your clinic, drug store, or online. When choosing one, look for these features: An automatic monitor that has an arm cuff. A cuff that wraps snugly, but not too tightly, around your upper arm. You should be able to fit only one finger between your arm and the cuff. A device that stores blood pressure reading results. Do not choose a monitor that measures your blood pressure from your wrist or finger. Follow your health care provider's instructions for how to take your blood pressure. To use this form: Get one reading in the morning (a.m.) before you take any medicines. Get one reading in the evening (p.m.) before supper. Take at least two readings with each blood pressure check. This makes sure the results are correct. Wait 1-2 minutes between measurements. Write down the results in the spaces on this form. Repeat this once a week, or as told by your health care provider. Make a follow-up appointment with your health care provider to discuss the results. Blood pressure log Date: _______________________ a.m. _____________________(1st reading) _____________________(2nd reading) p.m. _____________________(1st reading) _____________________(2nd reading) Date: _______________________ a.m. _____________________(1st reading) _____________________(2nd reading) p.m. _____________________(1st reading) _____________________(2nd reading) Date: _______________________ a.m. _____________________(1st reading) _____________________(2nd reading) p.m. _____________________(1st reading) _____________________(2nd reading) Date: _______________________ a.m. _____________________(1st reading) _____________________(2nd reading) p.m. _____________________(1st reading) _____________________(2nd reading) Date:  _______________________ a.m. _____________________(1st reading) _____________________(2nd reading) p.m. _____________________(1st reading) _____________________(2nd reading) This information is not intended to replace advice given to you by your health care provider. Make sure you discuss any questions you have with your health care provider. Document Revised: 08/06/2021 Document Reviewed: 08/06/2021 Elsevier Patient Education  2024 [employer].

## 2024-09-05 NOTE — Progress Notes (Signed)
 Subjective: CC: Neck pain PCP: Gladis Mustard, FNP Tanya Wilkinson is a 63 y.o. female presenting to clinic today for:  Neck pain/ BP Patient reports that she started having some left posterior arm irritation and aching pain as well as some lower neck pain that started on Monday.  She reports increased sensitivity along the base of the neck, particular when she tried to use her IcyHot patches in this area but the IcyHot patch did seem to alleviate some of the discomfort in the posterior arm.  She reports no sensory changes otherwise and no weakness.  No preceding injury.  Has a history of cervical discectomy in 2007 with Dr. Amon but has not had issues with her neck since that time.  She is use heating pad, ibuprofen  but none of these are really resolving the symptoms  She reports blood pressures at home tend to be less than 140/90.  She has been on hydrochlorothiazide  as she has had issues with swelling in the past.  Sometimes she takes 1-1/2 tablets when she seems especially swollen.  She is not particularly salt restrictive  ROS: Per HPI  Allergies  Allergen Reactions   Morphine And Codeine Itching   Past Medical History:  Diagnosis Date   Herniated disc    Hyperlipidemia    Hypertension    Osteopenia    Prediabetes    Vitamin D  deficiency     Current Outpatient Medications:    ALPRAZolam  (XANAX ) 0.25 MG tablet, Take 1 tablet (0.25 mg total) by mouth 2 (two) times daily as needed for anxiety., Disp: 20 tablet, Rfl: 1   atorvastatin  (LIPITOR) 40 MG tablet, Take 1 tablet (40 mg total) by mouth daily., Disp: 90 tablet, Rfl: 1   Blood Glucose Monitoring Suppl (BLOOD GLUCOSE MONITOR SYSTEM) w/Device KIT, Use in the morning, at noon, and at bedtime., Disp: 1 kit, Rfl: 0   CALCIUM  PO, Take by mouth., Disp: , Rfl:    Cholecalciferol (VITAMIN D ) 2000 units CAPS, Take by mouth., Disp: , Rfl:    hydrochlorothiazide  (HYDRODIURIL ) 25 MG tablet, Take 1 tablet (25 mg total) by  mouth daily., Disp: 90 tablet, Rfl: 1   Multiple Vitamins-Minerals (CENTRUM SILVER PO), Take 1 tablet by mouth daily., Disp: , Rfl:    Na Sulfate-K Sulfate-Mg Sulfate concentrate (SUPREP BOWEL PREP KIT) 17.5-3.13-1.6 GM/177ML SOLN, Take 1 kit (354 mLs total) by mouth as directed. For colonoscopy prep, Disp: 354 mL, Rfl: 0   pantoprazole  (PROTONIX ) 40 MG tablet, Take 1 tablet (40 mg total) by mouth daily before breakfast., Disp: 30 tablet, Rfl: 3   fenofibrate  (TRICOR ) 145 MG tablet, Take 1 tablet (145 mg total) by mouth daily. (Patient not taking: Reported on 09/05/2024), Disp: 90 tablet, Rfl: 1 Social History   Socioeconomic History   Marital status: Married    Spouse name: Not on file   Number of children: Not on file   Years of education: Not on file   Highest education level: Not on file  Occupational History   Not on file  Tobacco Use   Smoking status: Former    Current packs/day: 0.00    Types: Cigarettes    Quit date: 12/06/1994    Years since quitting: 29.7    Passive exposure: Never   Smokeless tobacco: Never  Vaping Use   Vaping status: Never Used  Substance and Sexual Activity   Alcohol use: No   Drug use: No   Sexual activity: Yes    Partners: Male  Birth control/protection: Surgical, Post-menopausal    Comment: BTL, menarche 63yo, sexual debut 63yo  Other Topics Concern   Not on file  Social History Narrative   Not on file   Social Drivers of Health   Financial Resource Strain: Not on file  Food Insecurity: Not on file  Transportation Needs: Not on file  Physical Activity: Not on file  Stress: Not on file  Social Connections: Not on file  Intimate Partner Violence: Not on file   Family History  Problem Relation Age of Onset   Diabetes Mother    Hypertension Mother    Kidney disease Mother    Leukemia Mother    Cancer Father        throat & lung-smoker   Stroke Father    Stroke Brother    Hypertension Brother    Cancer Brother 62       bladder    Stroke Brother    Early death Brother 9   Stroke Son    Hypertension Son    Colon cancer Neg Hx    Pancreatic cancer Neg Hx    Stomach cancer Neg Hx    Esophageal cancer Neg Hx    Breast cancer Neg Hx     Objective: Office vital signs reviewed. BP (!) 154/80   Pulse 75   Temp 98 F (36.7 C)   Ht 5' 2 (1.575 m)   Wt 162 lb 4 oz (73.6 kg)   LMP 12/07/2007   SpO2 99%   BMI 29.68 kg/m   Physical Examination:  General: Awake, alert, well nourished, No acute distress MSK: Full active range of motion that is painless of her upper extremities.  She has limited extension of the C-spine but full flexion and about a 25 degree loss of range of motion in rotation bilaterally.  No midline tenderness palpation to the C-spine.  No paraspinal muscle tenderness to palpation to the C-spine.  Upper extremity sensation grossly intact. 5/5 strength bilaterally  Assessment/ Plan: 63 y.o. female   Cervical radiculopathy - Plan: DG Cervical Spine 2 or 3 views, predniSONE  (STERAPRED UNI-PAK 21 TAB) 10 MG (21) TBPK tablet, tiZANidine  (ZANAFLEX ) 4 MG tablet  History of cervical spinal surgery - Plan: DG Cervical Spine 2 or 3 views, predniSONE  (STERAPRED UNI-PAK 21 TAB) 10 MG (21) TBPK tablet, tiZANidine  (ZANAFLEX ) 4 MG tablet  Elevated blood pressure reading   Plain films of the C-spine obtained given left-sided cervical radiculopathy that seems to be in a C8 dermatomal pattern.  Her case is complicated by history of previous C-spine surgery.  I am going to place her on prednisone  Dosepak as well as muscle relaxant as needed for the next week or so and then I would like her to follow-up in office with PCP if symptoms are not improving or resolved.  May need to consider physical therapy and repeat MRI with referral to neurosurgery again if symptoms are refractory to conservative measures.  Additionally, she will monitor blood pressures at home as they are elevated here in office but may be a pain  response.  Home blood pressures upon discussion seem to be controlled below 140/90.   Tanya CHRISTELLA Fielding, DO [facility] [phone]

## 2024-09-10 ENCOUNTER — Encounter: Payer: Self-pay | Admitting: Internal Medicine

## 2024-09-14 ENCOUNTER — Other Ambulatory Visit: Payer: Self-pay | Admitting: Nurse Practitioner

## 2024-09-14 ENCOUNTER — Other Ambulatory Visit (HOSPITAL_COMMUNITY): Payer: Self-pay

## 2024-09-14 DIAGNOSIS — E785 Hyperlipidemia, unspecified: Secondary | ICD-10-CM

## 2024-09-17 ENCOUNTER — Ambulatory Visit: Admitting: Internal Medicine

## 2024-09-17 ENCOUNTER — Encounter: Payer: Self-pay | Admitting: Internal Medicine

## 2024-09-17 VITALS — BP 115/67 | HR 67 | Temp 98.1°F | Resp 18 | Ht 62.0 in | Wt 162.0 lb

## 2024-09-17 DIAGNOSIS — D120 Benign neoplasm of cecum: Secondary | ICD-10-CM | POA: Diagnosis not present

## 2024-09-17 DIAGNOSIS — K219 Gastro-esophageal reflux disease without esophagitis: Secondary | ICD-10-CM | POA: Diagnosis not present

## 2024-09-17 DIAGNOSIS — Z1211 Encounter for screening for malignant neoplasm of colon: Secondary | ICD-10-CM

## 2024-09-17 DIAGNOSIS — R09A2 Foreign body sensation, throat: Secondary | ICD-10-CM | POA: Diagnosis not present

## 2024-09-17 DIAGNOSIS — I10 Essential (primary) hypertension: Secondary | ICD-10-CM | POA: Diagnosis not present

## 2024-09-17 DIAGNOSIS — K5730 Diverticulosis of large intestine without perforation or abscess without bleeding: Secondary | ICD-10-CM | POA: Diagnosis not present

## 2024-09-17 DIAGNOSIS — R053 Chronic cough: Secondary | ICD-10-CM | POA: Diagnosis not present

## 2024-09-17 DIAGNOSIS — D125 Benign neoplasm of sigmoid colon: Secondary | ICD-10-CM | POA: Diagnosis not present

## 2024-09-17 DIAGNOSIS — E785 Hyperlipidemia, unspecified: Secondary | ICD-10-CM | POA: Diagnosis not present

## 2024-09-17 DIAGNOSIS — D124 Benign neoplasm of descending colon: Secondary | ICD-10-CM

## 2024-09-17 DIAGNOSIS — R7303 Prediabetes: Secondary | ICD-10-CM | POA: Diagnosis not present

## 2024-09-17 MED ORDER — SODIUM CHLORIDE 0.9 % IV SOLN: 500.0000 mL | Freq: Once | INTRAVENOUS | Status: DC

## 2024-09-17 NOTE — Patient Instructions (Addendum)
 Resume previous diet Continue present medications  Await pathology results  Repeat colonoscopy recommended. Date will be determined based on pathology results. See handouts on polyps and diverticulosis  YOU HAD AN ENDOSCOPIC PROCEDURE TODAY AT THE [facility]:   Refer to the procedure report that was given to you for any specific questions about what was found during the examination.  If the procedure report does not answer your questions, please call your gastroenterologist to clarify.  If you requested that your care partner not be given the details of your procedure findings, then the procedure report has been included in a sealed envelope for you to review at your convenience later.  YOU SHOULD EXPECT: Some feelings of bloating in the abdomen. Passage of more gas than usual.  Walking can help get rid of the air that was put into your GI tract during the procedure and reduce the bloating. If you had a lower endoscopy (such as a colonoscopy or flexible sigmoidoscopy) you may notice spotting of blood in your stool or on the toilet paper. If you underwent a bowel prep for your procedure, you may not have a normal bowel movement for a few days.  Please Note:  You might notice some irritation and congestion in your nose or some drainage.  This is from the oxygen used during your procedure.  There is no need for concern and it should clear up in a day or so.  SYMPTOMS TO REPORT IMMEDIATELY: Following lower endoscopy (colonoscopy or flexible sigmoidoscopy):  Excessive amounts of blood in the stool  Significant tenderness or worsening of abdominal pains  Swelling of the abdomen that is new, acute  Fever of 100F or higher  Following upper endoscopy (EGD)  Vomiting of blood or coffee ground material  New chest pain or pain under the shoulder blades  Painful or persistently difficult swallowing  New shortness of breath  Black, tarry-looking stools  For urgent or emergent issues, a  gastroenterologist can be reached at any hour by calling [phone]. Do not use MyChart messaging for urgent concerns.   DIET:  We do recommend a small meal at first, but then you may proceed to your regular diet.  Drink plenty of fluids but you should avoid alcoholic beverages for 24 hours.  ACTIVITY:  You should plan to take it easy for the rest of today and you should NOT DRIVE or use heavy machinery until tomorrow (because of the sedation medicines used during the test).    FOLLOW UP: Our staff will call the number listed on your records the next business day following your procedure.  We will call around 7:15- 8:00 am to check on you and address any questions or concerns that you may have regarding the information given to you following your procedure. If we do not reach you, we will leave a message.     If any biopsies were taken you will be contacted by phone or by letter within the next 1-3 weeks.  Please call us  at [phone] if you have not heard about the biopsies in 3 weeks.   SIGNATURES/CONFIDENTIALITY: You and/or your care partner have signed paperwork which will be entered into your electronic medical record.  These signatures attest to the fact that that the information above on your After Visit Summary has been reviewed and is understood.  Full responsibility of the confidentiality of this discharge information lies with you and/or your care-partner.

## 2024-09-17 NOTE — Progress Notes (Signed)
 Report to PACU, RN, vss, BBS= Clear.

## 2024-09-17 NOTE — Progress Notes (Signed)
 GASTROENTEROLOGY PROCEDURE H&P NOTE   Primary Care Physician: Gladis Mustard, FNP    Reason for Procedure:  Question of LPR, cough and globus sensation, colon cancer screening  Plan:    EGD colonoscopy  Patient is appropriate for endoscopic procedure(s) in the ambulatory ([facility]) setting.  The nature of the procedure, as well as the risks, benefits, and alternatives were carefully and thoroughly reviewed with the patient. Ample time for discussion and questions allowed. The patient understood, was satisfied, and agreed to proceed.     HPI: Tanya Wilkinson is a 63 y.o. female who presents for EGD and colonoscopy.  Medical history as below.  Tolerated the prep.  No recent chest pain or shortness of breath.  No abdominal pain today.  Past Medical History:  Diagnosis Date   Basal cell carcinoma    Cataract    GERD (gastroesophageal reflux disease)    Herniated disc    Hyperlipidemia    Hypertension    Osteopenia    Prediabetes    Vitamin D  deficiency     Past Surgical History:  Procedure Laterality Date   ANTERIOR CERVICAL DECOMP/DISCECTOMY FUSION  08/06/2006   BASAL CELL CARCINOMA EXCISION     nose   CATARACT EXTRACTION Right 08/25/2023   CESAREAN SECTION  8015&8013   COLONOSCOPY  12/06/2000   Hospital in [facility]    TUBAL LIGATION  8014,8013   BTL    Prior to Admission medications   Medication Sig Start Date End Date Taking? Authorizing Provider  atorvastatin  (LIPITOR) 40 MG tablet Take 1 tablet (40 mg total) by mouth daily. 06/22/24  Yes Martin, Mary-Margaret, FNP  Blood Glucose Monitoring Suppl (BLOOD GLUCOSE MONITOR SYSTEM) w/Device KIT Use in the morning, at noon, and at bedtime. 12/29/23  Yes Gladis, Mary-Margaret, FNP  CALCIUM  PO Take by mouth.   Yes [provider]  Cholecalciferol (VITAMIN D ) 2000 units CAPS Take by mouth.   Yes [provider]  hydrochlorothiazide  (HYDRODIURIL ) 25 MG tablet Take 1 tablet (25 mg total) by mouth daily.  06/22/24  Yes Martin, Mary-Margaret, FNP  Multiple Vitamins-Minerals (CENTRUM SILVER PO) Take 1 tablet by mouth daily.   Yes [provider]  omega-3 acid ethyl esters (LOVAZA ) 1 g capsule Take 1 g by mouth daily.   Yes [provider]  pantoprazole  (PROTONIX ) 40 MG tablet Take 1 tablet (40 mg total) by mouth daily before breakfast. 07/26/24  Yes Kennedy-Smith, Colleen M, NP  tiZANidine  (ZANAFLEX ) 4 MG tablet Take 0.5-1 tablets (2-4 mg total) by mouth at bedtime as needed for muscle spasms. 09/05/24  Yes Gottschalk, Norene M, DO  ALPRAZolam  (XANAX ) 0.25 MG tablet Take 1 tablet (0.25 mg total) by mouth 2 (two) times daily as needed for anxiety. 12/27/23   Gladis, Mary-Margaret, FNP  fenofibrate  (TRICOR ) 145 MG tablet Take 1 tablet (145 mg total) by mouth daily. Patient not taking: Reported on 09/05/2024 12/27/23   Gladis Mustard, FNP    Current Outpatient Medications  Medication Sig Dispense Refill   atorvastatin  (LIPITOR) 40 MG tablet Take 1 tablet (40 mg total) by mouth daily. 90 tablet 1   Blood Glucose Monitoring Suppl (BLOOD GLUCOSE MONITOR SYSTEM) w/Device KIT Use in the morning, at noon, and at bedtime. 1 kit 0   CALCIUM  PO Take by mouth.     Cholecalciferol (VITAMIN D ) 2000 units CAPS Take by mouth.     hydrochlorothiazide  (HYDRODIURIL ) 25 MG tablet Take 1 tablet (25 mg total) by mouth daily. 90 tablet 1   Multiple  Vitamins-Minerals (CENTRUM SILVER PO) Take 1 tablet by mouth daily.     omega-3 acid ethyl esters (LOVAZA ) 1 g capsule Take 1 g by mouth daily.     pantoprazole  (PROTONIX ) 40 MG tablet Take 1 tablet (40 mg total) by mouth daily before breakfast. 30 tablet 3   tiZANidine  (ZANAFLEX ) 4 MG tablet Take 0.5-1 tablets (2-4 mg total) by mouth at bedtime as needed for muscle spasms. 30 tablet 0   ALPRAZolam  (XANAX ) 0.25 MG tablet Take 1 tablet (0.25 mg total) by mouth 2 (two) times daily as needed for anxiety. 20 tablet 1   fenofibrate  (TRICOR ) 145 MG tablet Take 1  tablet (145 mg total) by mouth daily. (Patient not taking: Reported on 09/05/2024) 90 tablet 1   Current Facility-Administered Medications  Medication Dose Route Frequency Provider Last Rate Last Admin   0.9 %  sodium chloride  infusion  500 mL Intravenous Once Nobuko Gsell, Gordy HERO, MD        Allergies as of 09/17/2024 - Review Complete 09/17/2024  Allergen Reaction Noted   Morphine and codeine Itching 11/07/2013    Family History  Problem Relation Age of Onset   Diabetes Mother    Hypertension Mother    Kidney disease Mother    Leukemia Mother    Cancer Father        throat & lung-smoker   Stroke Father    Stroke Brother    Hypertension Brother    Cancer Brother 72       bladder   Stroke Brother    Early death Brother 9   Stroke Son    Hypertension Son    Colon cancer Neg Hx    Pancreatic cancer Neg Hx    Stomach cancer Neg Hx    Esophageal cancer Neg Hx    Breast cancer Neg Hx    Rectal cancer Neg Hx     Social History   Socioeconomic History   Marital status: Married    Spouse name: Not on file   Number of children: Not on file   Years of education: Not on file   Highest education level: Not on file  Occupational History   Not on file  Tobacco Use   Smoking status: Former    Current packs/day: 0.00    Types: Cigarettes    Quit date: 12/06/1994    Years since quitting: 29.8    Passive exposure: Never   Smokeless tobacco: Never  Vaping Use   Vaping status: Never Used  Substance and Sexual Activity   Alcohol use: No   Drug use: No   Sexual activity: Yes    Partners: Male    Birth control/protection: Surgical, Post-menopausal    Comment: BTL, menarche 63yo, sexual debut 63yo  Other Topics Concern   Not on file  Social History Narrative   Not on file   Social Drivers of Health   Financial Resource Strain: Not on file  Food Insecurity: Not on file  Transportation Needs: Not on file  Physical Activity: Not on file  Stress: Not on file  Social Connections:  Not on file  Intimate Partner Violence: Not on file    Physical Exam: Vital signs in last 24 hours: @BP  (!) 155/94   Pulse 85   Temp 98.1 F (36.7 C) (Temporal)   Ht 5' 2 (1.575 m)   Wt 162 lb (73.5 kg)   LMP 12/07/2007   SpO2 98%   BMI 29.63 kg/m  GEN: NAD EYE: Sclerae anicteric ENT: MMM  CV: Non-tachycardic Pulm: CTA b/l GI: Soft, NT/ND NEURO:  Alert & Oriented x 3   Gordy Starch, MD [facility]  09/17/2024 1:57 PM

## 2024-09-17 NOTE — Op Note (Signed)
 [facility] Patient Name: Tanya Wilkinson Procedure Date: 09/17/2024 1:57 PM MRN: [id number] Endoscopist: Gordy CHRISTELLA Starch , MD, [id number] Age: 63 Referring MD:  Date of Birth: 07/13/1961 Gender: Female Account #: [id number] Procedure:                Colonoscopy Indications:              Screening for colorectal malignant neoplasm, Last                            colonoscopy 10 years ago Medicines:                Monitored Anesthesia Care Procedure:                Pre-Anesthesia Assessment:                           - Prior to the procedure, a History and Physical                            was performed, and patient medications and                            allergies were reviewed. The patient's tolerance of                            previous anesthesia was also reviewed. The risks                            and benefits of the procedure and the sedation                            options and risks were discussed with the patient.                            All questions were answered, and informed consent                            was obtained. Prior Anticoagulants: The patient has                            taken no anticoagulant or antiplatelet agents. ASA                            Grade Assessment: II - A patient with mild systemic                            disease. After reviewing the risks and benefits,                            the patient was deemed in satisfactory condition to                            undergo the procedure.  After obtaining informed consent, the colonoscope                            was passed under direct vision. Throughout the                            procedure, the patient's blood pressure, pulse, and                            oxygen saturations were monitored continuously. The                            Olympus Scope [id number] was introduced through the                            anus and advanced to the cecum,  identified by                            appendiceal orifice and ileocecal valve. The                            colonoscopy was performed without difficulty. The                            patient tolerated the procedure well. The quality                            of the bowel preparation was good. The ileocecal                            valve, appendiceal orifice, and rectum were                            photographed. Scope In: 2:14:07 PM Scope Out: 2:26:00 PM Scope Withdrawal Time: 0 hours 9 minutes 33 seconds  Total Procedure Duration: 0 hours 11 minutes 53 seconds  Findings:                 The digital rectal exam was normal.                           A 2 mm polyp was found in the cecum. The polyp was                            sessile. The polyp was removed with a cold snare.                            Resection and retrieval were complete.                           A 7 mm polyp was found in the descending colon. The                            polyp was sessile. The polyp was  removed with a                            cold snare. Resection and retrieval were complete.                           A 3 mm polyp was found in the sigmoid colon. The                            polyp was sessile. The polyp was removed with a                            cold snare. Resection and retrieval were complete.                           Multiple small-mouthed diverticula were found in                            the descending colon and hepatic flexure.                           The retroflexed view of the distal rectum and anal                            verge was normal and showed no anal or rectal                            abnormalities. Complications:            No immediate complications. Estimated Blood Loss:     Estimated blood loss: none. Impression:               - One 2 mm polyp in the cecum, removed with a cold                            snare. Resected and retrieved.                            - One 7 mm polyp in the descending colon, removed                            with a cold snare. Resected and retrieved.                           - One 3 mm polyp in the sigmoid colon, removed with                            a cold snare. Resected and retrieved.                           - Mild diverticulosis in the descending colon and                            at the hepatic flexure.                           -  The distal rectum and anal verge are normal on                            retroflexion view. Recommendation:           - Patient has a contact number available for                            emergencies. The signs and symptoms of potential                            delayed complications were discussed with the                            patient. Return to normal activities tomorrow.                            Written discharge instructions were provided to the                            patient.                           - Resume previous diet.                           - Continue present medications.                           - Await pathology results.                           - Repeat colonoscopy is recommended. The                            colonoscopy date will be determined after pathology                            results from today's exam become available for                            review. Gordy CHRISTELLA Starch, MD 09/17/2024 2:37:05 PM This report has been signed electronically.

## 2024-09-17 NOTE — Progress Notes (Signed)
 Called to room to assist during endoscopic procedure.  Patient ID and intended procedure confirmed with present staff. Received instructions for my participation in the procedure from the performing physician.

## 2024-09-17 NOTE — Op Note (Signed)
 [facility] Patient Name: Tanya Wilkinson Procedure Date: 09/17/2024 1:57 PM MRN: [id number] Endoscopist: Gordy CHRISTELLA Starch , MD, [id number] Age: 63 Referring MD:  Date of Birth: 1961/01/02 Gender: Female Account #: [id number] Procedure:                Upper GI endoscopy Indications:              Chronic cough, Globus sensation; improved with                            pantoprazole  40 mg daily Medicines:                Monitored Anesthesia Care Procedure:                Pre-Anesthesia Assessment:                           - Prior to the procedure, a History and Physical                            was performed, and patient medications and                            allergies were reviewed. The patient's tolerance of                            previous anesthesia was also reviewed. The risks                            and benefits of the procedure and the sedation                            options and risks were discussed with the patient.                            All questions were answered, and informed consent                            was obtained. Prior Anticoagulants: The patient has                            taken no anticoagulant or antiplatelet agents. ASA                            Grade Assessment: II - A patient with mild systemic                            disease. After reviewing the risks and benefits,                            the patient was deemed in satisfactory condition to                            undergo the procedure.  After obtaining informed consent, the endoscope was                            passed under direct vision. Throughout the                            procedure, the patient's blood pressure, pulse, and                            oxygen saturations were monitored continuously. The                            GIF [id number] #[id number] was introduced through the                            mouth, and advanced to the second  part of duodenum.                            The upper GI endoscopy was accomplished without                            difficulty. The patient tolerated the procedure                            well. Scope In: Scope Out: Findings:                 The esophagus was normal.                           The stomach was normal.                           The examined duodenum was normal. Complications:            No immediate complications. Estimated Blood Loss:     Estimated blood loss: none. Impression:               - Normal esophagus.                           - Normal stomach.                           - Normal examined duodenum.                           - No specimens collected. Recommendation:           - Patient has a contact number available for                            emergencies. The signs and symptoms of potential                            delayed complications were discussed with the  patient. Return to normal activities tomorrow.                            Written discharge instructions were provided to the                            patient.                           - Resume previous diet.                           - Continue present medications. Given improvement                            in globus sensation with pantoprazole  40 mg, this                            medication can be continued. If symptoms persist or                            change would proceed with barium esophagram. Gordy CHRISTELLA Starch, MD 09/17/2024 2:34:59 PM This report has been signed electronically.

## 2024-09-18 ENCOUNTER — Telehealth: Payer: Self-pay

## 2024-09-18 NOTE — Telephone Encounter (Signed)
  Follow up Call-     09/17/2024    1:24 PM  Call back number  Post procedure Call Back phone  # [phone]  Permission to leave phone message Yes     Patient questions:  Do you have a fever, pain , or abdominal swelling? No. Pain Score  0 *  Have you tolerated food without any problems? Yes.    Have you been able to return to your normal activities? Yes.    Do you have any questions about your discharge instructions: Diet   No. Medications  No. Follow up visit  No.  Do you have questions or concerns about your Care? No.  Actions: * If pain score is 4 or above: No action needed, pain <4.

## 2024-09-20 ENCOUNTER — Other Ambulatory Visit (HOSPITAL_COMMUNITY): Payer: Self-pay

## 2024-09-20 ENCOUNTER — Ambulatory Visit: Payer: Self-pay | Admitting: Internal Medicine

## 2024-09-20 ENCOUNTER — Other Ambulatory Visit: Payer: Self-pay

## 2024-09-20 LAB — SURGICAL PATHOLOGY

## 2024-09-20 MED ORDER — LISINOPRIL-HYDROCHLOROTHIAZIDE 20-25 MG PO TABS
1.0000 | ORAL_TABLET | Freq: Every day | ORAL | 1 refills | Status: AC
  Filled 2024-09-20: qty 90, 90d supply, fill #0
  Filled 2024-12-18: qty 90, 90d supply, fill #1

## 2024-09-20 NOTE — Telephone Encounter (Signed)
 Hydrochlorothiazide - start lisinopril 20/25 1 po daily. Continue to keep diary of blood pressure at home Meds ordered this encounter  Medications   lisinopril-hydrochlorothiazide  (ZESTORETIC) 20-25 MG tablet    Sig: Take 1 tablet by mouth daily.    Dispense:  90 tablet    Refill:  1    Supervising Provider:   MARYANNE CHEW A [[id number]]   Mary-Margaret Gladis, FNP

## 2024-09-28 ENCOUNTER — Other Ambulatory Visit: Payer: Self-pay | Admitting: Medical Genetics

## 2024-09-28 DIAGNOSIS — Z006 Encounter for examination for normal comparison and control in clinical research program: Secondary | ICD-10-CM

## 2024-10-09 ENCOUNTER — Telehealth: Payer: Self-pay | Admitting: Internal Medicine

## 2024-10-09 NOTE — Telephone Encounter (Signed)
 Inbound call from patient stating that she is experiencing acid reflux. She states that Elida mentioned possibly doing a barium swallow. Patient is requesting a call back to discuss. Please advise.

## 2024-10-09 NOTE — Telephone Encounter (Signed)
 Patient calls with complaints of continued reflux symptoms. Recent endoscopy 09/17/24 was normal. Patient states that she has not yet gotten an appointment with ENT. She states that she contacted ENT previously but they told her they would need a referral. I have provided patient with the phone number to [facility] and asked that she schedule an appointment for laryngoscopy. Advised that a referral is in [facility] for them. If this is negative, we can discuss moving forward with barium swallow for additional evaluation. Patient verbalizes understanding.

## 2024-10-10 DIAGNOSIS — R09A2 Foreign body sensation, throat: Secondary | ICD-10-CM

## 2024-10-10 DIAGNOSIS — R070 Pain in throat: Secondary | ICD-10-CM

## 2024-10-10 DIAGNOSIS — R059 Cough, unspecified: Secondary | ICD-10-CM

## 2024-10-22 ENCOUNTER — Other Ambulatory Visit (HOSPITAL_COMMUNITY): Payer: Self-pay

## 2024-10-22 ENCOUNTER — Ambulatory Visit (INDEPENDENT_AMBULATORY_CARE_PROVIDER_SITE_OTHER)

## 2024-10-22 ENCOUNTER — Encounter (INDEPENDENT_AMBULATORY_CARE_PROVIDER_SITE_OTHER): Payer: Self-pay

## 2024-10-22 VITALS — HR 87 | Temp 98.6°F | Ht 62.0 in | Wt 163.0 lb

## 2024-10-22 DIAGNOSIS — J300 Vasomotor rhinitis: Secondary | ICD-10-CM | POA: Diagnosis not present

## 2024-10-22 DIAGNOSIS — J343 Hypertrophy of nasal turbinates: Secondary | ICD-10-CM | POA: Diagnosis not present

## 2024-10-22 DIAGNOSIS — J342 Deviated nasal septum: Secondary | ICD-10-CM | POA: Diagnosis not present

## 2024-10-22 DIAGNOSIS — K219 Gastro-esophageal reflux disease without esophagitis: Secondary | ICD-10-CM | POA: Diagnosis not present

## 2024-10-22 DIAGNOSIS — R0982 Postnasal drip: Secondary | ICD-10-CM

## 2024-10-22 MED ORDER — IPRATROPIUM BROMIDE 0.03 % NA SOLN
2.0000 | Freq: Three times a day (TID) | NASAL | 12 refills | Status: AC
  Filled 2024-10-22: qty 30, 50d supply, fill #0
  Filled 2024-12-06: qty 30, 50d supply, fill #1

## 2024-10-22 MED ORDER — FLUTICASONE PROPIONATE 50 MCG/ACT NA SUSP
2.0000 | Freq: Every day | NASAL | 6 refills | Status: AC
  Filled 2024-10-22: qty 16, 30d supply, fill #0
  Filled 2024-12-06: qty 16, 30d supply, fill #1

## 2024-10-22 NOTE — Patient Instructions (Signed)
 Use flonase  every day Use ipratropium bromide up to three times a day

## 2024-10-22 NOTE — Progress Notes (Unsigned)
 Dear Dr. Cara, Here is my assessment for our mutual patient, Tanya Wilkinson. Thank you for allowing me the opportunity to care for your patient. Please do not hesitate to contact me should you have any other questions. Sincerely, Dr. Penne Croak  Otolaryngology Clinic Note Referring provider: Dr. Cara HPI:  Discussed the use of AI scribe software for clinical note transcription with the patient, who gave verbal consent to proceed.  History of Present Illness Tanya Wilkinson is a 63 year old female who presents with persistent symptoms of acid reflux and nasal congestion. She is accompanied by Tanya Wilkinson, who has experienced similar symptoms in the past.  Gastroesophageal reflux symptoms - Persistent symptoms since May 2024, initially managed with omeprazole, later switched to lansoprazole - Lansoprazole dose increased to 40 mg daily in August 2024 with initial improvement - Reflux symptoms recurred after a cold in late October 2024 - Symptoms include throat sensation, teary eyes, and sensation of something coming up when coughing - No spitting up food - Symptoms are exacerbated by certain odors and when sitting down - Symptoms are absent when lying down - No issues with belching or swallowing - No history of swallow study - Endoscopy in October 2024 was reportedly normal  Nasal congestion and postnasal drip - Frequent episodes of nasal congestion and postnasal drip - Symptoms triggered by certain smells - Uses Flonase  occasionally for nasal congestion - Symptoms are worse when sitting down and not present when lying down  Hypertension - History of high blood pressure - Started lisinopril in October 2024 after previously being on a diuretic - Blood pressure noted to be high during this visit - Blood pressure often elevated in medical settings  Independent Review of Additional Tests or Records:  Reviewed external note from referring PCP, Kennedy-Smith,describing  relevant history incorporated into today's evaluation.   PMH/Meds/All/SocHx/FamHx/ROS:   Past Medical History:  Diagnosis Date   Basal cell carcinoma    Cataract    GERD (gastroesophageal reflux disease)    Herniated disc    Hyperlipidemia    Hypertension    Osteopenia    Prediabetes    Vitamin D  deficiency      Past Surgical History:  Procedure Laterality Date   ANTERIOR CERVICAL DECOMP/DISCECTOMY FUSION  08/06/2006   BASAL CELL CARCINOMA EXCISION     nose   CATARACT EXTRACTION Right 08/25/2023   CESAREAN SECTION  8015&8013   [place]ONOSCOPY  12/06/2000   [place] [place]    TUBAL LIGATION  8014,8013   BTL    Family History  Problem Relation Age of Onset   Diabetes Mother    Hypertension Mother    Kidney disease Mother    Leukemia Mother    Cancer Father        throat & lung-smoker   Stroke Father    Stroke Brother    Hypertension Brother    Cancer Brother 7       bladder   Stroke Brother    Early death Brother 9   Stroke Son    Hypertension Son    Colon cancer Neg Hx    Pancreatic cancer Neg Hx    Stomach cancer Neg Hx    Esophageal cancer Neg Hx    Breast cancer Neg Hx    Rectal cancer Neg Hx      Social Connections: Not on file      Current Outpatient Medications:    fluticasone  (FLONASE ) 50 MCG/ACT nasal spray, Place 2 sprays into both nostrils daily.,  Disp: 16 g, Rfl: 6   ipratropium (ATROVENT) 0.03 % nasal spray, Place 2 sprays into both nostrils 3 (three) times daily. As needed for runny nose and post nasal drip, Disp: 30 mL, Rfl: 12   ALPRAZolam  (XANAX ) 0.25 MG tablet, Take 1 tablet (0.25 mg total) by mouth 2 (two) times daily as needed for anxiety., Disp: 20 tablet, Rfl: 1   atorvastatin  (LIPITOR) 40 MG tablet, Take 1 tablet (40 mg total) by mouth daily., Disp: 90 tablet, Rfl: 1   Blood Glucose Monitoring Suppl (BLOOD GLUCOSE MONITOR SYSTEM) w/Device KIT, Use in the morning, at noon, and at bedtime., Disp: 1 kit, Rfl: 0   CALCIUM  PO, Take by  mouth., Disp: , Rfl:    Cholecalciferol (VITAMIN D ) 2000 units CAPS, Take by mouth., Disp: , Rfl:    fenofibrate  (TRICOR ) 145 MG tablet, Take 1 tablet (145 mg total) by mouth daily. (Patient not taking: Reported on 09/05/2024), Disp: 90 tablet, Rfl: 1   hydrochlorothiazide  (HYDRODIURIL ) 25 MG tablet, Take 1 tablet (25 mg total) by mouth daily., Disp: 90 tablet, Rfl: 1   lisinopril-hydrochlorothiazide  (ZESTORETIC) 20-25 MG tablet, Take 1 tablet by mouth daily., Disp: 90 tablet, Rfl: 1   Multiple Vitamins-Minerals (CENTRUM SILVER PO), Take 1 tablet by mouth daily., Disp: , Rfl:    omega-3 acid ethyl esters (LOVAZA ) 1 g capsule, Take 1 g by mouth daily., Disp: , Rfl:    pantoprazole  (PROTONIX ) 40 MG tablet, Take 1 tablet (40 mg total) by mouth daily before breakfast., Disp: 30 tablet, Rfl: 3   tiZANidine  (ZANAFLEX ) 4 MG tablet, Take 0.5-1 tablets (2-4 mg total) by mouth at bedtime as needed for muscle spasms., Disp: 30 tablet, Rfl: 0   Physical Exam:   Pulse 87   Temp 98.6 F (37 C)   Ht 5' 2 (1.575 m)   Wt 163 lb (73.9 kg)   LMP 12/07/2007   SpO2 96%   BMI 29.81 kg/m   The patient was awake, alert, and appropriate. The external ears were inspected, and otoscopy was performed to evaluate the external auditory canals and tympanic membranes. The nasal cavity and septum were examined for mucosal changes, obstruction, or discharge. The oral cavity and oropharynx were inspected for mucosal lesions, infection, or tonsillar hypertrophy. The neck was palpated for lymphadenopathy, thyroid  abnormalities, or other masses. Cranial nerve function was grossly intact.  Pertinent Findings: Physical Exam HEENT: Nasal septum deviated to the left. Throat normal, no redness or swelling. Oral cavity normal. NECK: Neck supple, no masses.   Seprately Identifiable Procedures:  I personally ordered, reviewed and interpreted the following with the patient today  Given the patient's symptoms and incomplete  visualization of critical sinonasal areas with anterior rhinoscopy, a separately performed diagnostic nasal endoscopy procedure is indicated for a complete rhinologic evaluation per American Rhinologic Society recommendations ([email or web address]://[email or web address]-rhinologic.org/position-statements)  I personally ordered, reviewed and interpreted the following with the patient today  Procedure Note Diagnostic Nasal Endoscopy CPT CODE -- 68768 - Mod 25  Prior to initiating any procedures, risks/benefits/alternatives were explained to the patient and verbal consent obtained.  Pre-procedure diagnosis: Concern for mass Post-procedure diagnosis: same Indication: See pre-procedure diagnosis and physical exam above Complications: None apparent EBL: 0 mL Anesthesia: Lidocaine 4% and topical decongestant was topically sprayed in each nasal cavity  Description of Procedure:  Patient was identified. A flexible fiberoptic endoscope was utilized to evaluate the sinonasal cavities, mucosa, sinus ostia and turbinates and septum.  Overall, signs of mucosal inflammation are noted.  Also noted  are deviated nasal seputm with inferior turbinate hypertrophy.  No mucopurulence, polyps, or masses noted.   Right Middle meatus: clear Right SE Recess: clear Left MM: clear Left SE Recess: clear Photodocumentation was obtained.  Procedure Note Pre-procedure diagnosis:  GERD Post-procedure diagnosis: Same Procedure: Transnasal Fiberoptic Laryngoscopy, CPT 31575 - Mod 25 Indication: GERD, on PPI but concern for LPR Complications: None apparent EBL: 0 mL  The procedure was undertaken to further evaluate the patient's complaint of GERD, with mirror exam inadequate for appropriate examination due to gag reflex and poor patient tolerance  Procedure:  Patient was identified as correct patient. Verbal consent was obtained. The nose was sprayed with oxymetazoline and 4% lidocaine. The The flexible laryngoscope was passed through the  nose to view the nasal cavity, pharynx (oropharynx, hypopharynx) and larynx.  The larynx was examined at rest and during multiple phonatory tasks. Documentation was obtained and reviewed with patient. The scope was removed. The patient tolerated the procedure well.  Findings: The nasal cavity and nasopharynx did not reveal any masses or lesions, mucosa appeared to be without obvious lesions. The tongue base, pharyngeal walls, piriform sinuses, vallecula, epiglottis and postcricoid region are normal in appearance EXCEPT: NO arytenoid edema or erythema. The visualized portion of the subglottis and proximal trachea is widely patent. The vocal folds are mobile bilaterally. There are no lesions on the free edge of the vocal folds nor elsewhere in the larynx worrisome for malignancy.    Electronically signed by: Penne Croak, DO 10/23/2024 10:32 PM   Impression & Plans:  Tanya Wilkinson is a 63 y.o. female  1. DNS (deviated nasal septum)   2. Nonallergic vasomotor rhinitis   3. Post-nasal drip   4. Hypertrophy of both inferior nasal turbinates   5. Gastroesophageal reflux disease without esophagitis    - Findings and diagnoses discussed in detail with the patient. - Risks, benefits, and alternatives were reviewed. Through shared decision making, the patient elects to proceed with below. Assessment & Plan Vasomotor rhinitis with postnasal drip - normal larynx on FFL Symptoms consistent with vasomotor nonallergic rhinitis, triggered by odors. Differential diagnosis includes allergies, but current symptoms suggest vasomotor rhinitis.  - Prescribed Flonase  for daily use. - Prescribed additional nasal spray for daily use initially, then as needed. - Recommended Neilmed sinus rinse or saline spray for nasal irrigation. - Scheduled follow-up in 6-8 weeks to assess treatment efficacy.  Deviated nasal septum Left-sided deviation contributing to nasal congestion and potentially affecting postnasal  drip.  GERD - continue current regimen  - Orders placed: No orders of the defined types were placed in this encounter.  - Medications prescribed/continued/adjusted:  Meds ordered this encounter  Medications   fluticasone  (FLONASE ) 50 MCG/ACT nasal spray    Sig: Place 2 sprays into both nostrils daily.    Dispense:  16 g    Refill:  6   ipratropium (ATROVENT) 0.03 % nasal spray    Sig: Place 2 sprays into both nostrils 3 (three) times daily. As needed for runny nose and post nasal drip    Dispense:  30 mL    Refill:  12   - Education materials provided to the patient. - Follow up: 2 months. Patient instructed to return sooner or go to the ED if new/worsening symptoms develop.  Thank you for allowing me the opportunity to care for your patient. Please do not hesitate to contact me should you have any other questions.  Sincerely, Penne Croak, DO Otolaryngologist (ENT) [facility] Phone:  [phone] Fax: [phone]  10/23/2024, 10:30 PM

## 2024-11-06 ENCOUNTER — Telehealth (INDEPENDENT_AMBULATORY_CARE_PROVIDER_SITE_OTHER): Payer: Self-pay

## 2024-11-06 NOTE — Telephone Encounter (Signed)
 Patient called and stated she has been taking the nose spray Dr. Anice prescribed her is not working she is feeling worse than she did before wants to be seen before January if at all possible.

## 2024-11-06 NOTE — Telephone Encounter (Signed)
 Called patient, patient confirmed as of right now it is just the congestion. I notified to the patient to increase the Flonase  to 2 sprays twice a day. Patient understood stated she would give a try. I told the patient to try that for about 2 weeks if she does not see improvement then to call us  back.

## 2024-12-07 ENCOUNTER — Other Ambulatory Visit (HOSPITAL_COMMUNITY): Payer: Self-pay

## 2024-12-10 ENCOUNTER — Encounter (INDEPENDENT_AMBULATORY_CARE_PROVIDER_SITE_OTHER): Payer: Self-pay

## 2024-12-10 ENCOUNTER — Other Ambulatory Visit (HOSPITAL_COMMUNITY): Payer: Self-pay

## 2024-12-10 ENCOUNTER — Ambulatory Visit (INDEPENDENT_AMBULATORY_CARE_PROVIDER_SITE_OTHER)

## 2024-12-10 VITALS — BP 135/81 | HR 88 | Temp 98.5°F | Wt 163.0 lb

## 2024-12-10 DIAGNOSIS — R1110 Vomiting, unspecified: Secondary | ICD-10-CM

## 2024-12-10 DIAGNOSIS — J300 Vasomotor rhinitis: Secondary | ICD-10-CM

## 2024-12-10 DIAGNOSIS — J342 Deviated nasal septum: Secondary | ICD-10-CM

## 2024-12-10 DIAGNOSIS — J343 Hypertrophy of nasal turbinates: Secondary | ICD-10-CM

## 2024-12-10 DIAGNOSIS — K219 Gastro-esophageal reflux disease without esophagitis: Secondary | ICD-10-CM

## 2024-12-10 DIAGNOSIS — R0982 Postnasal drip: Secondary | ICD-10-CM | POA: Diagnosis not present

## 2024-12-10 MED ORDER — AMOXICILLIN-POT CLAVULANATE 875-125 MG PO TABS
1.0000 | ORAL_TABLET | Freq: Two times a day (BID) | ORAL | 0 refills | Status: DC
  Filled 2024-12-10: qty 28, 14d supply, fill #0

## 2024-12-12 ENCOUNTER — Other Ambulatory Visit (INDEPENDENT_AMBULATORY_CARE_PROVIDER_SITE_OTHER): Payer: Self-pay

## 2024-12-12 DIAGNOSIS — K219 Gastro-esophageal reflux disease without esophagitis: Secondary | ICD-10-CM

## 2024-12-12 DIAGNOSIS — R1110 Vomiting, unspecified: Secondary | ICD-10-CM

## 2024-12-12 NOTE — Progress Notes (Signed)
 Dear Dr. Gladis, Here is my assessment for our mutual patient, Tanya Wilkinson. Thank you for allowing me the opportunity to care for your patient. Please do not hesitate to contact me should you have any other questions. Sincerely, Dr. Penne Croak  Otolaryngology Clinic Note Referring provider: Dr. Gladis HPI:  Discussed the use of AI scribe software for clinical note transcription with the patient, who gave verbal consent to proceed.  History of Present Illness Tanya Wilkinson is a 64 year old female who presents with persistent symptoms of acid reflux and nasal congestion. She is accompanied by Tanya Wilkinson, who has experienced similar symptoms in the past.  Gastroesophageal reflux symptoms - Persistent symptoms since May 2024, initially managed with omeprazole, later switched to lansoprazole - Lansoprazole dose increased to 40 mg daily in August 2024 with initial improvement - Reflux symptoms recurred after a cold in late October 2024 - Symptoms include throat sensation, teary eyes, and sensation of something coming up when coughing - No spitting up food - Symptoms are exacerbated by certain odors and when sitting down - Symptoms are absent when lying down - No issues with belching or swallowing - No history of swallow study - Endoscopy in October 2024 was reportedly normal  Nasal congestion and postnasal drip - Frequent episodes of nasal congestion and postnasal drip - Symptoms triggered by certain smells - Uses Flonase  occasionally for nasal congestion - Symptoms are worse when sitting down and not present when lying down  Hypertension - History of high blood pressure - Started lisinopril  in October 2024 after previously being on a diuretic - Blood pressure noted to be high during this visit - Blood pressure often elevated in medical settings  Interval hx Tanya Wilkinson is a 64 year old female with chronic sinusitis and deviated nasal septum who presents with persistent nasal  obstruction and postnasal drainage.  Nasal obstruction and congestion - Intermittent, severe unilateral nasal congestion described as a sensation of obstruction - Associated with difficulty breathing through the nose - Episodes occur at various times, sometimes postprandially - Relieved by blowing the nose; inability to clear nose immediately leads to significant dyspnea - Occasional minor unilateral epistaxis with forceful nose blowing - No changes in olfaction - Possible anxiety during episodes of nasal obstruction  Postnasal drainage and sinus symptoms - Persistent postnasal drainage - Uses fluticasone  nasal spray up to three times daily and another prescribed nasal spray up to five times daily due to inadequate symptom control - Performs sinus irrigation with NeilMed saline every other day, which is beneficial - Uses a humidifier at home - No recent antibiotic use for these symptoms - Trialed guaifenesin for expectoration but is cautious due to hypertension  Dysphagia, regurgitation, and cough - New onset cough and gagging, particularly approximately one hour after eating - Sensation of regurgitation during episodes - Episodes sometimes associated with lacrimation and urge to blow nose - Occasional perception of food or mucus in throat, resolves after coughing or gagging - No consistent relationship identified between eating and symptom onset  Gastroesophageal reflux disease (gerd) - History of well-controlled GERD - Prior unremarkable upper endoscopy  Hypertension and medication effects - Switched to lisinopril  for hypertension in October - Aware of potential for lisinopril  to cause cough  History of basal cell carcinoma - Remote history of basal cell carcinoma of the face, treated with excision three years ago - Inquires about relevance of prior basal cell carcinoma to current symptoms  Independent Review of Additional Tests or Records:  Reviewed external note from  referring PCP, Tanya Wilkinson,describing relevant history incorporated into todays evaluation.   PMH/Meds/All/SocHx/FamHx/ROS:   Past Medical History:  Diagnosis Date   Basal cell carcinoma    Cataract    GERD (gastroesophageal reflux disease)    Herniated disc    Hyperlipidemia    Hypertension    Osteopenia    Prediabetes    Vitamin D  deficiency      Past Surgical History:  Procedure Laterality Date   ANTERIOR CERVICAL DECOMP/DISCECTOMY FUSION  08/06/2006   BASAL CELL CARCINOMA EXCISION     nose   CATARACT EXTRACTION Right 08/25/2023   CESAREAN SECTION  8015&8013   COLONOSCOPY  12/06/2000   Hospital in [place]    TUBAL LIGATION  8014,8013   BTL    Family History  Problem Relation Age of Onset   Diabetes Mother    Hypertension Mother    Kidney disease Mother    Leukemia Mother    Cancer Father        throat & lung-smoker   Stroke Father    Stroke Brother    Hypertension Brother    Cancer Brother 40       bladder   Stroke Brother    Early death Brother 9   Stroke Son    Hypertension Son    Colon cancer Neg Hx    Pancreatic cancer Neg Hx    Stomach cancer Neg Hx    Esophageal cancer Neg Hx    Breast cancer Neg Hx    Rectal cancer Neg Hx      Social Connections: Not on file      Current Outpatient Medications:    ALPRAZolam  (XANAX ) 0.25 MG tablet, Take 1 tablet (0.25 mg total) by mouth 2 (two) times daily as needed for anxiety., Disp: 20 tablet, Rfl: 1   amoxicillin -clavulanate (AUGMENTIN ) 875-125 MG tablet, Take 1 tablet by mouth 2 (two) times daily., Disp: 28 tablet, Rfl: 0   atorvastatin  (LIPITOR) 40 MG tablet, Take 1 tablet (40 mg total) by mouth daily., Disp: 90 tablet, Rfl: 1   Blood Glucose Monitoring Suppl (BLOOD GLUCOSE MONITOR SYSTEM) w/Device KIT, Use in the morning, at noon, and at bedtime., Disp: 1 kit, Rfl: 0   CALCIUM  PO, Take by mouth., Disp: , Rfl:    Cholecalciferol (VITAMIN D ) 2000 units CAPS, Take by mouth., Disp: , Rfl:    fluticasone   (FLONASE ) 50 MCG/ACT nasal spray, Place 2 sprays into both nostrils daily., Disp: 16 g, Rfl: 6   hydrochlorothiazide  (HYDRODIURIL ) 25 MG tablet, Take 1 tablet (25 mg total) by mouth daily., Disp: 90 tablet, Rfl: 1   ipratropium (ATROVENT ) 0.03 % nasal spray, Place 2 sprays into both nostrils 3 (three) times daily. As needed for runny nose and post nasal drip, Disp: 30 mL, Rfl: 12   lisinopril -hydrochlorothiazide  (ZESTORETIC ) 20-25 MG tablet, Take 1 tablet by mouth daily., Disp: 90 tablet, Rfl: 1   Multiple Vitamins-Minerals (CENTRUM SILVER PO), Take 1 tablet by mouth daily., Disp: , Rfl:    omega-3 acid ethyl esters (LOVAZA ) 1 g capsule, Take 1 g by mouth daily., Disp: , Rfl:    pantoprazole  (PROTONIX ) 40 MG tablet, Take 1 tablet (40 mg total) by mouth daily before breakfast., Disp: 30 tablet, Rfl: 3   tiZANidine  (ZANAFLEX ) 4 MG tablet, Take 0.5-1 tablets (2-4 mg total) by mouth at bedtime as needed for muscle spasms., Disp: 30 tablet, Rfl: 0   fenofibrate  (TRICOR ) 145 MG tablet, Take 1 tablet (145 mg total) by  mouth daily. (Patient not taking: Reported on 12/10/2024), Disp: 90 tablet, Rfl: 1   Physical Exam:   BP 135/81 (BP Location: Left Arm, Patient Position: Sitting, Cuff Size: Normal)   Pulse 88   Temp 98.5 F (36.9 C)   Wt 163 lb (73.9 kg)   LMP 12/07/2007   SpO2 98%   BMI 29.81 kg/m   The patient was awake, alert, and appropriate. The external ears were inspected, and otoscopy was performed to evaluate the external auditory canals and tympanic membranes. The nasal cavity and septum were examined for mucosal changes, obstruction, or discharge. The oral cavity and oropharynx were inspected for mucosal lesions, infection, or tonsillar hypertrophy. The neck was palpated for lymphadenopathy, thyroid  abnormalities, or other masses. Cranial nerve function was grossly intact.  Pertinent Findings: Physical Exam  NECK: Neck supple, no masses. HEENT: Atraumatic, normocephalic. Nose with deviated  septum. Oral cavity and throat normal.   Seprately Identifiable Procedures:  I personally ordered, reviewed and interpreted the following with the patient today -none  Impression & Plans:  Tanya Wilkinson is a 64 y.o. female  1. Regurgitation of food   2. Nonallergic vasomotor rhinitis   3. DNS (deviated nasal septum)   4. Post-nasal drip   5. Hypertrophy of both inferior nasal turbinates   6. Gastroesophageal reflux disease without esophagitis    - Findings and diagnoses discussed in detail with the patient. - Risks, benefits, and alternatives were reviewed. Through shared decision making, the patient elects to proceed with below. Assessment & Plan Regurgitation of food and dysphagia Intermittent postprandial regurgitation and dysphagia with differential diagnosis including GERD, lisinopril  side effects, and dysmotility. Previous EGD unremarkable. - Ordered swallow study to assess for oropharyngeal or esophageal dysmotility. - Instructed her to report results or symptom changes in two weeks. - Scheduled follow-up in one month after swallow study.  Chronic rhinosinusitis with nonallergic vasomotor rhinitis Persistent nasal congestion and postnasal drainage with partial response to therapy. Possible acute sinus infection; empiric antibiotics initiated. - Prescribed Augmentin  for two weeks for possible acute sinus infection. - Instructed her to increase Fluticasone  nasal spray to three times daily and Ipratropium nasal spray to five times daily. - Advised continued use of sinus rinses (NeilMed) every other day. - Approved Mucinex (guaifenesin) for expectoration; clarified safety with antihypertensive medications. - Recommended use of humidifier at home. - Instructed her to provide symptom update at completion of antibiotic course. - Deferred CT scan of sinuses; to be reconsidered if symptoms persist.  Deviated nasal septum Deviated septum causing chronic obstruction and intermittent  epistaxis. Surgical correction remains definitive; conservative management continues. - Discussed septoplasty as a future option if medical management fails. - Reinforced proper nasal spray technique (spray directed laterally). - Deferred repeat nasal endoscopy; to be reconsidered at future visits.  - Orders placed:  Orders Placed This Encounter  Procedures   SLP modified barium swallow   - Medications prescribed/continued/adjusted:  Meds ordered this encounter  Medications   amoxicillin -clavulanate (AUGMENTIN ) 875-125 MG tablet    Sig: Take 1 tablet by mouth 2 (two) times daily.    Dispense:  28 tablet    Refill:  0   - Education materials provided to the patient. - Follow up: 2 months. Patient instructed to return sooner or go to the ED if new/worsening symptoms develop.  Thank you for allowing me the opportunity to care for your patient. Please do not hesitate to contact me should you have any other questions.  Sincerely, Penne Croak, DO Otolaryngologist (ENT)  [facility] Phone: [phone] Fax: [phone]  12/12/2024, 9:59 AM

## 2024-12-18 ENCOUNTER — Other Ambulatory Visit (HOSPITAL_COMMUNITY): Payer: Self-pay

## 2024-12-18 DIAGNOSIS — R1310 Dysphagia, unspecified: Secondary | ICD-10-CM

## 2024-12-19 ENCOUNTER — Other Ambulatory Visit: Payer: Self-pay

## 2024-12-21 ENCOUNTER — Ambulatory Visit: Payer: Self-pay | Admitting: Nurse Practitioner

## 2024-12-21 ENCOUNTER — Other Ambulatory Visit: Payer: Self-pay

## 2024-12-21 ENCOUNTER — Encounter: Payer: Self-pay | Admitting: Nurse Practitioner

## 2024-12-21 ENCOUNTER — Other Ambulatory Visit (HOSPITAL_COMMUNITY): Payer: Self-pay

## 2024-12-21 VITALS — BP 118/76 | HR 80 | Temp 98.1°F | Ht 62.0 in | Wt 157.0 lb

## 2024-12-21 DIAGNOSIS — R7989 Other specified abnormal findings of blood chemistry: Secondary | ICD-10-CM | POA: Diagnosis not present

## 2024-12-21 DIAGNOSIS — M8588 Other specified disorders of bone density and structure, other site: Secondary | ICD-10-CM | POA: Diagnosis not present

## 2024-12-21 DIAGNOSIS — I10 Essential (primary) hypertension: Secondary | ICD-10-CM

## 2024-12-21 DIAGNOSIS — Z23 Encounter for immunization: Secondary | ICD-10-CM

## 2024-12-21 DIAGNOSIS — K219 Gastro-esophageal reflux disease without esophagitis: Secondary | ICD-10-CM

## 2024-12-21 DIAGNOSIS — E8881 Metabolic syndrome: Secondary | ICD-10-CM | POA: Diagnosis not present

## 2024-12-21 DIAGNOSIS — Z6828 Body mass index (BMI) 28.0-28.9, adult: Secondary | ICD-10-CM

## 2024-12-21 DIAGNOSIS — E785 Hyperlipidemia, unspecified: Secondary | ICD-10-CM | POA: Diagnosis not present

## 2024-12-21 LAB — CMP14+EGFR
ALT: 15 IU/L (ref 0–32)
AST: 20 IU/L (ref 0–40)
Albumin: 4.6 g/dL (ref 3.9–4.9)
Alkaline Phosphatase: 100 IU/L (ref 49–135)
BUN/Creatinine Ratio: 12 (ref 12–28)
BUN: 9 mg/dL (ref 8–27)
Bilirubin Total: 0.6 mg/dL (ref 0.0–1.2)
CO2: 25 mmol/L (ref 20–29)
Calcium: 10.2 mg/dL (ref 8.7–10.3)
Chloride: 95 mmol/L — ABNORMAL LOW (ref 96–106)
Creatinine, Ser: 0.74 mg/dL (ref 0.57–1.00)
Globulin, Total: 2.3 g/dL (ref 1.5–4.5)
Glucose: 109 mg/dL — ABNORMAL HIGH (ref 70–99)
Potassium: 4.3 mmol/L (ref 3.5–5.2)
Sodium: 137 mmol/L (ref 134–144)
Total Protein: 6.9 g/dL (ref 6.0–8.5)
eGFR: 90 mL/min/1.73

## 2024-12-21 LAB — LIPID PANEL
Chol/HDL Ratio: 3.4 ratio (ref 0.0–4.4)
Cholesterol, Total: 168 mg/dL (ref 100–199)
HDL: 50 mg/dL
LDL Chol Calc (NIH): 93 mg/dL (ref 0–99)
Triglycerides: 140 mg/dL (ref 0–149)
VLDL Cholesterol Cal: 25 mg/dL (ref 5–40)

## 2024-12-21 LAB — CBC WITH DIFFERENTIAL/PLATELET
Basophils Absolute: 0 x10E3/uL (ref 0.0–0.2)
Basos: 0 %
EOS (ABSOLUTE): 0.1 x10E3/uL (ref 0.0–0.4)
Eos: 3 %
Hematocrit: 37.7 % (ref 34.0–46.6)
Hemoglobin: 12.7 g/dL (ref 11.1–15.9)
Immature Grans (Abs): 0 x10E3/uL (ref 0.0–0.1)
Immature Granulocytes: 0 %
Lymphocytes Absolute: 1.8 x10E3/uL (ref 0.7–3.1)
Lymphs: 36 %
MCH: 31.9 pg (ref 26.6–33.0)
MCHC: 33.7 g/dL (ref 31.5–35.7)
MCV: 95 fL (ref 79–97)
Monocytes Absolute: 0.5 x10E3/uL (ref 0.1–0.9)
Monocytes: 9 %
Neutrophils Absolute: 2.5 x10E3/uL (ref 1.4–7.0)
Neutrophils: 52 %
Platelets: 296 x10E3/uL (ref 150–450)
RBC: 3.98 x10E6/uL (ref 3.77–5.28)
RDW: 12 % (ref 11.7–15.4)
WBC: 4.9 x10E3/uL (ref 3.4–10.8)

## 2024-12-21 LAB — BAYER DCA HB A1C WAIVED: HB A1C (BAYER DCA - WAIVED): 5.5 % (ref 4.8–5.6)

## 2024-12-21 MED ORDER — LOSARTAN POTASSIUM-HCTZ 100-25 MG PO TABS
1.0000 | ORAL_TABLET | Freq: Every day | ORAL | 1 refills | Status: AC
  Filled 2024-12-21: qty 90, 90d supply, fill #0

## 2024-12-21 MED ORDER — PANTOPRAZOLE SODIUM 40 MG PO TBEC
40.0000 mg | DELAYED_RELEASE_TABLET | Freq: Every day | ORAL | 1 refills | Status: AC
  Filled 2024-12-21: qty 90, 90d supply, fill #0

## 2024-12-21 MED ORDER — ATORVASTATIN CALCIUM 40 MG PO TABS
40.0000 mg | ORAL_TABLET | Freq: Every day | ORAL | 1 refills | Status: AC
  Filled 2024-12-21: qty 90, 90d supply, fill #0

## 2024-12-21 NOTE — Progress Notes (Addendum)
 "  Subjective:    Patient ID: Tanya Wilkinson, female    DOB: Jul 25, 1961, 64 y.o.   MRN: [id number]   Chief Complaint: medical management of chronic issues     HPI:  Tanya Wilkinson is a 64 y.o. who identifies as a female who was assigned female at birth.   Social history: Lives with: husband Work history: works for [employer] in today for follow up of the following chronic medical issues:  1. Essential hypertension No c/o chest  pain, sob or headache. Does not check blood pressure at home. Is on lisinopril  and has had a cough since started taking. BP Readings from Last 3 Encounters:  12/10/24 135/81  09/17/24 115/67  09/05/24 (!) 154/80     2. Hyperlipidemia with target LDL less than 100 Does not really watch diet and does no dedicated exercise.' Lab Results  Component Value Date   CHOL 180 06/22/2024   HDL 52 06/22/2024   LDLCALC 102 (H) 06/22/2024   TRIG 148 06/22/2024   CHOLHDL 3.5 06/22/2024   The 10-year ASCVD risk score (Arnett DK, et al., 2019) is: 6.2%   3. Metabolic syndrome She does not check her blood sugars at home. Lab Results  Component Value Date   HGBA1C 5.6 06/22/2024     4. Low serum vitamin D  Is on daily vitamin d  supplement  5. Osteopenia, unspecified location Last dexascan was done on 03/09/22. T score -2.0.   6. BMI 28.0-28.9,adult Weight is down 6 lbs  Wt Readings from Last 3 Encounters:  12/21/24 157 lb (71.2 kg)  12/10/24 163 lb (73.9 kg)  10/22/24 163 lb (73.9 kg)   BMI Readings from Last 3 Encounters:  12/21/24 28.72 kg/m  12/10/24 29.81 kg/m  10/22/24 29.81 kg/m        New complaints: None today   Allergies  Allergen Reactions   Morphine And Codeine Itching   Outpatient Encounter Medications as of 12/21/2024  Medication Sig   ALPRAZolam  (XANAX ) 0.25 MG tablet Take 1 tablet (0.25 mg total) by mouth 2 (two) times daily as needed for anxiety.   amoxicillin -clavulanate (AUGMENTIN ) 875-125 MG tablet Take  1 tablet by mouth 2 (two) times daily.   atorvastatin  (LIPITOR) 40 MG tablet Take 1 tablet (40 mg total) by mouth daily.   Blood Glucose Monitoring Suppl (BLOOD GLUCOSE MONITOR SYSTEM) w/Device KIT Use in the morning, at noon, and at bedtime.   CALCIUM  PO Take by mouth.   Cholecalciferol (VITAMIN D ) 2000 units CAPS Take by mouth.   fenofibrate  (TRICOR ) 145 MG tablet Take 1 tablet (145 mg total) by mouth daily. (Patient not taking: Reported on 12/10/2024)   fluticasone  (FLONASE ) 50 MCG/ACT nasal spray Place 2 sprays into both nostrils daily.   hydrochlorothiazide  (HYDRODIURIL ) 25 MG tablet Take 1 tablet (25 mg total) by mouth daily.   ipratropium (ATROVENT ) 0.03 % nasal spray Place 2 sprays into both nostrils 3 (three) times daily. As needed for runny nose and post nasal drip   lisinopril -hydrochlorothiazide  (ZESTORETIC ) 20-25 MG tablet Take 1 tablet by mouth daily.   Multiple Vitamins-Minerals (CENTRUM SILVER PO) Take 1 tablet by mouth daily.   omega-3 acid ethyl esters (LOVAZA ) 1 g capsule Take 1 g by mouth daily.   pantoprazole  (PROTONIX ) 40 MG tablet Take 1 tablet (40 mg total) by mouth daily before breakfast.   tiZANidine  (ZANAFLEX ) 4 MG tablet Take 0.5-1 tablets (2-4 mg total) by mouth at bedtime as needed for muscle spasms.   No  facility-administered encounter medications on file as of 12/21/2024.    Past Surgical History:  Procedure Laterality Date   ANTERIOR CERVICAL DECOMP/DISCECTOMY FUSION  08/06/2006   BASAL CELL CARCINOMA EXCISION     nose   CATARACT EXTRACTION Right 08/25/2023   CESAREAN SECTION  8015&8013   COLONOSCOPY  12/06/2000   Hospital in [place]    TUBAL LIGATION  8014,8013   BTL    Family History  Problem Relation Age of Onset   Diabetes Mother    Hypertension Mother    Kidney disease Mother    Leukemia Mother    Cancer Father        throat & lung-smoker   Stroke Father    Stroke Brother    Hypertension Brother    Cancer Brother 61       bladder   Stroke  Brother    Early death Brother 9   Stroke Son    Hypertension Son    Colon cancer Neg Hx    Pancreatic cancer Neg Hx    Stomach cancer Neg Hx    Esophageal cancer Neg Hx    Breast cancer Neg Hx    Rectal cancer Neg Hx       Controlled substance contract: n/a     Review of Systems  Constitutional:  Negative for diaphoresis.  Eyes:  Negative for pain.  Respiratory:  Negative for shortness of breath.   Cardiovascular:  Negative for chest pain, palpitations and leg swelling.  Gastrointestinal:  Negative for abdominal pain.  Endocrine: Negative for polydipsia.  Skin:  Negative for rash.  Neurological:  Negative for dizziness, weakness and headaches.  Hematological:  Does not bruise/bleed easily.  All other systems reviewed and are negative.      Objective:   Physical Exam Vitals and nursing note reviewed.  Constitutional:      General: She is not in acute distress.    Appearance: Normal appearance. She is well-developed.  HENT:     Head: Normocephalic.     Right Ear: Tympanic membrane normal.     Left Ear: Tympanic membrane normal.     Nose: Nose normal.     Mouth/Throat:     Mouth: Mucous membranes are moist.  Eyes:     Pupils: Pupils are equal, round, and reactive to light.  Neck:     Vascular: No carotid bruit or JVD.  Cardiovascular:     Rate and Rhythm: Normal rate and regular rhythm.     Heart sounds: Normal heart sounds.  Pulmonary:     Effort: Pulmonary effort is normal. No respiratory distress.     Breath sounds: Normal breath sounds. No wheezing or rales.  Chest:     Chest wall: No tenderness.  Abdominal:     General: Bowel sounds are normal. There is no distension or abdominal bruit.     Palpations: Abdomen is soft. There is no hepatomegaly, splenomegaly, mass or pulsatile mass.     Tenderness: There is no abdominal tenderness.  Musculoskeletal:        General: Normal range of motion.     Cervical back: Normal range of motion and neck supple.   Lymphadenopathy:     Cervical: No cervical adenopathy.  Skin:    General: Skin is warm and dry.     Comments: 1cm scaley facial lesion  Neurological:     Mental Status: She is alert and oriented to person, place, and time.     Deep Tendon Reflexes: Reflexes are normal and  symmetric.  Psychiatric:        Behavior: Behavior normal.        Thought Content: Thought content normal.        Judgment: Judgment normal.    EKG- NSR-Mary-Margaret Gladis, FNP  BP 118/76   Pulse 80   Temp 98.1 F (36.7 C) (Temporal)   Ht 5' 2 (1.575 m)   Wt 157 lb (71.2 kg)   LMP 12/07/2007   SpO2 99%   BMI 28.72 kg/m    LMP 12/07/2007       Assessment & Plan:   Tanya Wilkinson comes in today with chief complaint of Medical Management of Chronic Issues   Diagnosis and orders addressed:  1. Essential hypertension (Primary) Dash diet Stop lisinopril  - CBC with Differential/Platelet - CMP14+EGFR - losartan -hydrochlorothiazide  (HYZAAR ) 100-25 MG tablet; Take 1 tablet by mouth daily.  Dispense: 90 tablet; Refill: 1 - EKG 12-Lead  2. Hyperlipidemia with target LDL less than 100 Low fat diet - Lipid panel - atorvastatin  (LIPITOR) 40 MG tablet; Take 1 tablet (40 mg total) by mouth daily.  Dispense: 90 tablet; Refill: 1  3. Metabolic syndrome Continue to watch carbs on diet - Bayer DCA Hb A1c Waived  4. Low serum vitamin D  Continue daily vitamin d  supplement  5. Gastroesophageal reflux disease without esophagitis Avoid spicy foods Do not eat 2 hours prior to bedtime - pantoprazole  (PROTONIX ) 40 MG tablet; Take 1 tablet (40 mg total) by mouth daily before breakfast.  Dispense: 90 tablet; Refill: 1  6. Osteopenia of lumbar spine Weight bearing exercises  7. BMI 28.0-28.9,adult Discussed diet and exercise for person with BMI >25 Will recheck weight in 3-6 months    Labs pending Health Maintenance reviewed Diet and exercise encouraged  Follow up plan: 6 months   Mary-Margaret  Gladis, FNP  "

## 2024-12-21 NOTE — Addendum Note (Signed)
 Addended by: Jannett Schmall, MANDY G on: 12/21/2024 12:29 PM   Modules accepted: Orders

## 2025-01-09 ENCOUNTER — Other Ambulatory Visit (INDEPENDENT_AMBULATORY_CARE_PROVIDER_SITE_OTHER): Payer: Self-pay

## 2025-01-09 ENCOUNTER — Telehealth (INDEPENDENT_AMBULATORY_CARE_PROVIDER_SITE_OTHER): Payer: Self-pay

## 2025-01-09 DIAGNOSIS — J329 Chronic sinusitis, unspecified: Secondary | ICD-10-CM

## 2025-01-09 NOTE — Telephone Encounter (Signed)
 I sent the order for the CT sinus. Thanks!

## 2025-01-09 NOTE — Telephone Encounter (Signed)
 Patient called call was answered. Patient stated that her throat is feeling better and her swallowing as well. Patient stated she does not think she needs the barium study any more but would like to proceed on with a CT scan of her sinuses. Please advise.

## 2025-01-09 NOTE — Telephone Encounter (Signed)
 Called patient to let them know that Dr. Anice sent in the order for a CT Scan. Patient understood.

## 2025-01-16 ENCOUNTER — Encounter (HOSPITAL_COMMUNITY)

## 2025-01-16 ENCOUNTER — Other Ambulatory Visit (HOSPITAL_COMMUNITY)

## 2025-06-20 ENCOUNTER — Ambulatory Visit: Admitting: Nurse Practitioner
